# Patient Record
Sex: Female | Born: 1935 | Race: White | Hispanic: No | Smoking: Never smoker
Health system: Southern US, Community
[De-identification: ages and names within clinical notes are randomized; demographics above are authoritative.]

## PROBLEM LIST (undated history)

## (undated) DIAGNOSIS — Z96649 Presence of unspecified artificial hip joint: Secondary | ICD-10-CM

## (undated) DIAGNOSIS — K219 Gastro-esophageal reflux disease without esophagitis: Secondary | ICD-10-CM

## (undated) DIAGNOSIS — K5792 Diverticulitis of intestine, part unspecified, without perforation or abscess without bleeding: Secondary | ICD-10-CM

## (undated) DIAGNOSIS — N6452 Nipple discharge: Secondary | ICD-10-CM

## (undated) DIAGNOSIS — I878 Other specified disorders of veins: Secondary | ICD-10-CM

## (undated) DIAGNOSIS — I4891 Unspecified atrial fibrillation: Secondary | ICD-10-CM

## (undated) DIAGNOSIS — M1990 Unspecified osteoarthritis, unspecified site: Secondary | ICD-10-CM

## (undated) DIAGNOSIS — Z9889 Other specified postprocedural states: Secondary | ICD-10-CM

## (undated) DIAGNOSIS — R55 Syncope and collapse: Secondary | ICD-10-CM

## (undated) DIAGNOSIS — D649 Anemia, unspecified: Secondary | ICD-10-CM

## (undated) DIAGNOSIS — G2581 Restless legs syndrome: Secondary | ICD-10-CM

## (undated) HISTORY — DX: Unspecified osteoarthritis, unspecified site: M19.90

## (undated) HISTORY — PX: APPENDECTOMY: SHX54

## (undated) HISTORY — DX: Other specified postprocedural states: Z98.890

## (undated) HISTORY — DX: Presence of unspecified artificial hip joint: Z96.649

## (undated) HISTORY — DX: Nipple discharge: N64.52

## (undated) HISTORY — DX: Anemia, unspecified: D64.9

## (undated) HISTORY — PX: ABDOMINAL HYSTERECTOMY: SHX81

## (undated) HISTORY — PX: LAPAROSCOPIC GASTROTOMY W/ REPAIR OF ULCER: SUR772

## (undated) HISTORY — PX: CHOLECYSTECTOMY: SHX55

## (undated) HISTORY — DX: Gastro-esophageal reflux disease without esophagitis: K21.9

## (undated) HISTORY — DX: Unspecified atrial fibrillation: I48.91

## (undated) HISTORY — DX: Diverticulitis of intestine, part unspecified, without perforation or abscess without bleeding: K57.92

## (undated) HISTORY — PX: TONSILLECTOMY AND ADENOIDECTOMY: SHX28

---

## 1997-06-02 ENCOUNTER — Inpatient Hospital Stay (HOSPITAL_COMMUNITY): Admission: AD | Admit: 1997-06-02 | Discharge: 1997-06-03 | Payer: Self-pay | Admitting: Cardiology

## 1998-06-08 ENCOUNTER — Other Ambulatory Visit: Admission: RE | Admit: 1998-06-08 | Discharge: 1998-06-08 | Payer: Self-pay | Admitting: Obstetrics and Gynecology

## 1999-08-09 ENCOUNTER — Other Ambulatory Visit: Admission: RE | Admit: 1999-08-09 | Discharge: 1999-08-09 | Payer: Self-pay | Admitting: Obstetrics and Gynecology

## 2000-11-28 ENCOUNTER — Other Ambulatory Visit: Admission: RE | Admit: 2000-11-28 | Discharge: 2000-11-28 | Payer: Self-pay | Admitting: Obstetrics and Gynecology

## 2001-05-25 ENCOUNTER — Encounter: Payer: Self-pay | Admitting: Emergency Medicine

## 2001-05-25 ENCOUNTER — Emergency Department (HOSPITAL_COMMUNITY): Admission: EM | Admit: 2001-05-25 | Discharge: 2001-05-25 | Payer: Self-pay | Admitting: Emergency Medicine

## 2001-06-18 ENCOUNTER — Encounter: Payer: Self-pay | Admitting: Emergency Medicine

## 2001-06-18 ENCOUNTER — Emergency Department (HOSPITAL_COMMUNITY): Admission: EM | Admit: 2001-06-18 | Discharge: 2001-06-18 | Payer: Self-pay | Admitting: Emergency Medicine

## 2001-06-25 ENCOUNTER — Encounter: Payer: Self-pay | Admitting: Internal Medicine

## 2001-06-25 ENCOUNTER — Encounter: Admission: RE | Admit: 2001-06-25 | Discharge: 2001-06-25 | Payer: Self-pay | Admitting: Internal Medicine

## 2001-07-19 ENCOUNTER — Encounter: Payer: Self-pay | Admitting: Emergency Medicine

## 2001-07-19 ENCOUNTER — Inpatient Hospital Stay (HOSPITAL_COMMUNITY): Admission: EM | Admit: 2001-07-19 | Discharge: 2001-07-21 | Payer: Self-pay | Admitting: *Deleted

## 2001-07-20 ENCOUNTER — Encounter: Payer: Self-pay | Admitting: Cardiovascular Disease

## 2001-11-05 ENCOUNTER — Encounter: Payer: Self-pay | Admitting: Emergency Medicine

## 2001-11-05 ENCOUNTER — Inpatient Hospital Stay (HOSPITAL_COMMUNITY): Admission: EM | Admit: 2001-11-05 | Discharge: 2001-11-08 | Payer: Self-pay | Admitting: Emergency Medicine

## 2001-11-06 ENCOUNTER — Encounter: Payer: Self-pay | Admitting: Internal Medicine

## 2003-11-29 ENCOUNTER — Ambulatory Visit: Payer: Self-pay | Admitting: Cardiology

## 2003-12-14 ENCOUNTER — Ambulatory Visit: Payer: Self-pay | Admitting: Cardiology

## 2003-12-19 ENCOUNTER — Ambulatory Visit: Payer: Self-pay | Admitting: Cardiovascular Disease

## 2004-01-06 ENCOUNTER — Ambulatory Visit: Payer: Self-pay | Admitting: Cardiology

## 2004-02-03 ENCOUNTER — Ambulatory Visit: Payer: Self-pay | Admitting: Cardiovascular Disease

## 2004-02-17 ENCOUNTER — Ambulatory Visit: Payer: Self-pay | Admitting: Cardiology

## 2004-03-09 ENCOUNTER — Ambulatory Visit: Payer: Self-pay | Admitting: Internal Medicine

## 2004-03-15 ENCOUNTER — Ambulatory Visit (HOSPITAL_COMMUNITY): Admission: RE | Admit: 2004-03-15 | Discharge: 2004-03-15 | Payer: Self-pay | Admitting: Anesthesiology

## 2004-04-06 ENCOUNTER — Ambulatory Visit: Payer: Self-pay | Admitting: Cardiology

## 2004-04-20 ENCOUNTER — Ambulatory Visit: Payer: Self-pay | Admitting: Gastroenterology

## 2004-04-23 ENCOUNTER — Ambulatory Visit: Payer: Self-pay | Admitting: Cardiology

## 2004-05-14 ENCOUNTER — Ambulatory Visit: Payer: Self-pay | Admitting: Cardiology

## 2004-06-11 ENCOUNTER — Ambulatory Visit: Payer: Self-pay | Admitting: Cardiology

## 2004-06-13 ENCOUNTER — Ambulatory Visit: Payer: Self-pay | Admitting: Cardiovascular Disease

## 2004-06-26 ENCOUNTER — Ambulatory Visit: Payer: Self-pay

## 2004-07-11 ENCOUNTER — Ambulatory Visit: Payer: Self-pay | Admitting: *Deleted

## 2004-08-08 ENCOUNTER — Ambulatory Visit: Payer: Self-pay | Admitting: Cardiology

## 2004-08-22 ENCOUNTER — Ambulatory Visit: Payer: Self-pay | Admitting: Internal Medicine

## 2004-08-29 ENCOUNTER — Ambulatory Visit: Payer: Self-pay | Admitting: Cardiology

## 2004-09-19 ENCOUNTER — Ambulatory Visit: Payer: Self-pay | Admitting: *Deleted

## 2004-10-10 ENCOUNTER — Ambulatory Visit: Payer: Self-pay | Admitting: Cardiology

## 2004-10-24 ENCOUNTER — Ambulatory Visit: Payer: Self-pay | Admitting: Cardiology

## 2004-11-14 ENCOUNTER — Ambulatory Visit: Payer: Self-pay | Admitting: Cardiology

## 2004-12-12 ENCOUNTER — Ambulatory Visit: Payer: Self-pay | Admitting: Cardiology

## 2005-01-03 ENCOUNTER — Ambulatory Visit: Payer: Self-pay | Admitting: *Deleted

## 2005-01-31 ENCOUNTER — Ambulatory Visit: Payer: Self-pay | Admitting: Cardiology

## 2005-03-01 ENCOUNTER — Ambulatory Visit: Payer: Self-pay | Admitting: Internal Medicine

## 2005-03-29 ENCOUNTER — Ambulatory Visit: Payer: Self-pay | Admitting: Internal Medicine

## 2005-05-03 ENCOUNTER — Ambulatory Visit: Payer: Self-pay | Admitting: Cardiology

## 2005-05-30 ENCOUNTER — Ambulatory Visit: Payer: Self-pay | Admitting: Cardiovascular Disease

## 2005-05-30 ENCOUNTER — Ambulatory Visit: Payer: Self-pay | Admitting: Cardiology

## 2005-06-19 ENCOUNTER — Ambulatory Visit: Payer: Self-pay | Admitting: Internal Medicine

## 2005-06-20 ENCOUNTER — Ambulatory Visit: Payer: Self-pay | Admitting: Cardiology

## 2005-07-11 ENCOUNTER — Ambulatory Visit: Payer: Self-pay | Admitting: Cardiology

## 2005-07-12 ENCOUNTER — Ambulatory Visit: Payer: Self-pay | Admitting: Gastroenterology

## 2005-07-26 ENCOUNTER — Ambulatory Visit: Payer: Self-pay | Admitting: Internal Medicine

## 2005-08-05 ENCOUNTER — Ambulatory Visit: Payer: Self-pay | Admitting: Cardiology

## 2005-08-19 ENCOUNTER — Ambulatory Visit: Payer: Self-pay | Admitting: Cardiovascular Disease

## 2005-09-02 ENCOUNTER — Encounter: Admission: RE | Admit: 2005-09-02 | Discharge: 2005-09-02 | Payer: Self-pay | Admitting: General Surgery

## 2005-09-03 ENCOUNTER — Ambulatory Visit (HOSPITAL_BASED_OUTPATIENT_CLINIC_OR_DEPARTMENT_OTHER): Admission: RE | Admit: 2005-09-03 | Discharge: 2005-09-03 | Payer: Self-pay | Admitting: General Surgery

## 2005-09-03 ENCOUNTER — Encounter (INDEPENDENT_AMBULATORY_CARE_PROVIDER_SITE_OTHER): Payer: Self-pay | Admitting: *Deleted

## 2005-09-11 ENCOUNTER — Ambulatory Visit: Payer: Self-pay | Admitting: Cardiology

## 2005-09-20 ENCOUNTER — Ambulatory Visit: Payer: Self-pay | Admitting: Cardiology

## 2005-10-08 ENCOUNTER — Ambulatory Visit: Payer: Self-pay | Admitting: Cardiology

## 2005-11-05 ENCOUNTER — Ambulatory Visit: Payer: Self-pay | Admitting: Cardiology

## 2005-11-29 ENCOUNTER — Ambulatory Visit: Payer: Self-pay | Admitting: Cardiovascular Disease

## 2005-12-04 ENCOUNTER — Ambulatory Visit: Payer: Self-pay | Admitting: Cardiology

## 2005-12-20 ENCOUNTER — Ambulatory Visit: Payer: Self-pay | Admitting: Cardiology

## 2006-01-17 ENCOUNTER — Ambulatory Visit: Payer: Self-pay | Admitting: Cardiology

## 2006-02-14 ENCOUNTER — Ambulatory Visit: Payer: Self-pay | Admitting: Internal Medicine

## 2006-02-14 ENCOUNTER — Ambulatory Visit: Payer: Self-pay | Admitting: Gastroenterology

## 2006-02-17 ENCOUNTER — Ambulatory Visit: Payer: Self-pay | Admitting: Internal Medicine

## 2006-02-20 ENCOUNTER — Ambulatory Visit: Payer: Self-pay | Admitting: Gastroenterology

## 2006-02-21 DIAGNOSIS — Z9889 Other specified postprocedural states: Secondary | ICD-10-CM

## 2006-02-21 HISTORY — DX: Other specified postprocedural states: Z98.890

## 2006-02-28 ENCOUNTER — Ambulatory Visit: Payer: Self-pay | Admitting: *Deleted

## 2006-03-07 ENCOUNTER — Ambulatory Visit: Payer: Self-pay | Admitting: Gastroenterology

## 2006-03-07 LAB — CONVERTED CEMR LAB
OCCULT 1: POSITIVE — AB
OCCULT 4: NEGATIVE
OCCULT 5: NEGATIVE

## 2006-03-11 ENCOUNTER — Ambulatory Visit: Payer: Self-pay | Admitting: Gastroenterology

## 2006-03-14 ENCOUNTER — Ambulatory Visit: Payer: Self-pay | Admitting: Internal Medicine

## 2006-03-14 ENCOUNTER — Inpatient Hospital Stay (HOSPITAL_COMMUNITY): Admission: EM | Admit: 2006-03-14 | Discharge: 2006-03-18 | Payer: Self-pay | Admitting: *Deleted

## 2006-03-14 LAB — CONVERTED CEMR LAB
INR: 7 (ref 0.9–2.0)
Prothrombin Time: 34.7 s (ref 10.0–14.0)

## 2006-03-17 ENCOUNTER — Ambulatory Visit: Payer: Self-pay | Admitting: Gastroenterology

## 2006-03-19 ENCOUNTER — Ambulatory Visit: Payer: Self-pay | Admitting: Internal Medicine

## 2006-03-24 ENCOUNTER — Ambulatory Visit: Payer: Self-pay | Admitting: Cardiovascular Disease

## 2006-04-03 ENCOUNTER — Emergency Department (HOSPITAL_COMMUNITY): Admission: EM | Admit: 2006-04-03 | Discharge: 2006-04-03 | Payer: Self-pay | Admitting: Emergency Medicine

## 2006-04-08 ENCOUNTER — Ambulatory Visit: Payer: Self-pay | Admitting: Gastroenterology

## 2006-04-08 ENCOUNTER — Ambulatory Visit: Payer: Self-pay | Admitting: Cardiology

## 2006-04-15 ENCOUNTER — Ambulatory Visit: Payer: Self-pay | Admitting: Internal Medicine

## 2006-04-21 ENCOUNTER — Ambulatory Visit: Payer: Self-pay | Admitting: Cardiovascular Disease

## 2006-05-12 ENCOUNTER — Ambulatory Visit: Payer: Self-pay | Admitting: Cardiology

## 2006-05-16 ENCOUNTER — Ambulatory Visit: Payer: Self-pay | Admitting: Internal Medicine

## 2006-05-16 LAB — CONVERTED CEMR LAB: Potassium: 4.5 meq/L (ref 3.5–5.1)

## 2006-05-20 ENCOUNTER — Ambulatory Visit: Payer: Self-pay | Admitting: Cardiology

## 2006-05-30 ENCOUNTER — Ambulatory Visit: Payer: Self-pay | Admitting: Cardiology

## 2006-05-30 ENCOUNTER — Ambulatory Visit: Payer: Self-pay | Admitting: Cardiovascular Disease

## 2006-06-06 ENCOUNTER — Ambulatory Visit: Payer: Self-pay | Admitting: Cardiology

## 2006-06-19 ENCOUNTER — Ambulatory Visit: Payer: Self-pay | Admitting: *Deleted

## 2006-06-26 ENCOUNTER — Encounter: Payer: Self-pay | Admitting: Internal Medicine

## 2006-06-27 ENCOUNTER — Encounter: Payer: Self-pay | Admitting: Internal Medicine

## 2006-07-01 ENCOUNTER — Ambulatory Visit: Payer: Self-pay | Admitting: Cardiology

## 2006-07-15 ENCOUNTER — Ambulatory Visit: Payer: Self-pay | Admitting: Cardiology

## 2006-08-05 ENCOUNTER — Ambulatory Visit: Payer: Self-pay | Admitting: Cardiovascular Disease

## 2006-08-12 ENCOUNTER — Telehealth (INDEPENDENT_AMBULATORY_CARE_PROVIDER_SITE_OTHER): Payer: Self-pay | Admitting: *Deleted

## 2006-08-22 ENCOUNTER — Ambulatory Visit: Payer: Self-pay | Admitting: Internal Medicine

## 2006-08-28 LAB — CONVERTED CEMR LAB
Basophils Relative: 0.9 % (ref 0.0–1.0)
HCT: 27.9 % — ABNORMAL LOW (ref 36.0–46.0)
Hemoglobin: 9.4 g/dL — ABNORMAL LOW (ref 12.0–15.0)
Lymphocytes Relative: 40.6 % (ref 12.0–46.0)
Monocytes Absolute: 0.5 10*3/uL (ref 0.2–0.7)
Monocytes Relative: 9.9 % (ref 3.0–11.0)
Neutro Abs: 2.3 10*3/uL (ref 1.4–7.7)
Neutrophils Relative %: 45.1 % (ref 43.0–77.0)

## 2006-09-02 ENCOUNTER — Ambulatory Visit: Payer: Self-pay | Admitting: Cardiology

## 2006-09-02 ENCOUNTER — Telehealth: Payer: Self-pay | Admitting: Internal Medicine

## 2006-09-09 ENCOUNTER — Ambulatory Visit: Payer: Self-pay | Admitting: Internal Medicine

## 2006-09-10 ENCOUNTER — Encounter (INDEPENDENT_AMBULATORY_CARE_PROVIDER_SITE_OTHER): Payer: Self-pay | Admitting: *Deleted

## 2006-09-26 ENCOUNTER — Encounter: Payer: Self-pay | Admitting: Internal Medicine

## 2006-09-29 ENCOUNTER — Ambulatory Visit: Payer: Self-pay | Admitting: Cardiology

## 2006-10-27 ENCOUNTER — Ambulatory Visit: Payer: Self-pay | Admitting: Internal Medicine

## 2006-11-07 ENCOUNTER — Ambulatory Visit: Payer: Self-pay | Admitting: Cardiovascular Disease

## 2006-11-14 ENCOUNTER — Ambulatory Visit: Payer: Self-pay | Admitting: Cardiovascular Disease

## 2006-12-05 ENCOUNTER — Ambulatory Visit: Payer: Self-pay | Admitting: Cardiology

## 2006-12-16 ENCOUNTER — Ambulatory Visit (HOSPITAL_COMMUNITY): Admission: RE | Admit: 2006-12-16 | Discharge: 2006-12-16 | Payer: Self-pay | Admitting: General Surgery

## 2006-12-25 ENCOUNTER — Ambulatory Visit: Payer: Self-pay | Admitting: Cardiovascular Disease

## 2007-01-05 ENCOUNTER — Ambulatory Visit: Payer: Self-pay | Admitting: Internal Medicine

## 2007-01-21 ENCOUNTER — Ambulatory Visit: Payer: Self-pay | Admitting: Cardiology

## 2007-02-18 ENCOUNTER — Ambulatory Visit: Payer: Self-pay | Admitting: Cardiology

## 2007-03-17 ENCOUNTER — Ambulatory Visit: Payer: Self-pay | Admitting: Cardiology

## 2007-04-08 ENCOUNTER — Encounter (INDEPENDENT_AMBULATORY_CARE_PROVIDER_SITE_OTHER): Payer: Self-pay | Admitting: *Deleted

## 2007-04-08 ENCOUNTER — Telehealth (INDEPENDENT_AMBULATORY_CARE_PROVIDER_SITE_OTHER): Payer: Self-pay | Admitting: *Deleted

## 2007-04-14 ENCOUNTER — Ambulatory Visit: Payer: Self-pay | Admitting: Cardiovascular Disease

## 2007-05-12 ENCOUNTER — Ambulatory Visit: Payer: Self-pay | Admitting: Cardiology

## 2007-06-09 ENCOUNTER — Ambulatory Visit: Payer: Self-pay | Admitting: Cardiology

## 2007-07-06 ENCOUNTER — Ambulatory Visit: Payer: Self-pay | Admitting: Internal Medicine

## 2007-07-13 ENCOUNTER — Encounter: Admission: RE | Admit: 2007-07-13 | Discharge: 2007-07-13 | Payer: Self-pay | Admitting: Anesthesiology

## 2007-07-27 ENCOUNTER — Ambulatory Visit: Payer: Self-pay | Admitting: Internal Medicine

## 2007-08-18 ENCOUNTER — Ambulatory Visit: Payer: Self-pay | Admitting: Internal Medicine

## 2007-08-27 ENCOUNTER — Telehealth (INDEPENDENT_AMBULATORY_CARE_PROVIDER_SITE_OTHER): Payer: Self-pay | Admitting: *Deleted

## 2007-09-15 ENCOUNTER — Ambulatory Visit: Payer: Self-pay | Admitting: Cardiology

## 2007-10-13 ENCOUNTER — Ambulatory Visit: Payer: Self-pay | Admitting: Cardiology

## 2007-11-02 ENCOUNTER — Ambulatory Visit: Payer: Self-pay | Admitting: Cardiology

## 2007-11-20 ENCOUNTER — Encounter: Payer: Self-pay | Admitting: Internal Medicine

## 2007-11-25 ENCOUNTER — Ambulatory Visit: Payer: Self-pay | Admitting: Cardiovascular Disease

## 2007-12-07 ENCOUNTER — Ambulatory Visit: Payer: Self-pay | Admitting: Cardiology

## 2007-12-07 ENCOUNTER — Ambulatory Visit: Payer: Self-pay | Admitting: Cardiovascular Disease

## 2007-12-23 ENCOUNTER — Ambulatory Visit: Payer: Self-pay | Admitting: Internal Medicine

## 2008-01-13 ENCOUNTER — Ambulatory Visit: Payer: Self-pay | Admitting: Cardiovascular Disease

## 2008-01-22 HISTORY — PX: TOTAL HIP ARTHROPLASTY: SHX124

## 2008-02-04 ENCOUNTER — Ambulatory Visit: Payer: Self-pay | Admitting: Cardiovascular Disease

## 2008-02-18 ENCOUNTER — Ambulatory Visit: Payer: Self-pay | Admitting: Internal Medicine

## 2008-03-07 ENCOUNTER — Ambulatory Visit: Payer: Self-pay | Admitting: Internal Medicine

## 2008-03-09 DIAGNOSIS — I482 Chronic atrial fibrillation, unspecified: Secondary | ICD-10-CM

## 2008-03-09 DIAGNOSIS — T827XXA Infection and inflammatory reaction due to other cardiac and vascular devices, implants and grafts, initial encounter: Secondary | ICD-10-CM

## 2008-03-10 ENCOUNTER — Encounter: Payer: Self-pay | Admitting: Cardiovascular Disease

## 2008-03-10 ENCOUNTER — Ambulatory Visit: Payer: Self-pay | Admitting: Cardiovascular Disease

## 2008-03-18 ENCOUNTER — Inpatient Hospital Stay (HOSPITAL_COMMUNITY): Admission: RE | Admit: 2008-03-18 | Discharge: 2008-03-22 | Payer: Self-pay | Admitting: Orthopedic Surgery

## 2008-05-09 ENCOUNTER — Ambulatory Visit: Payer: Self-pay | Admitting: Internal Medicine

## 2008-05-09 DIAGNOSIS — I872 Venous insufficiency (chronic) (peripheral): Secondary | ICD-10-CM | POA: Insufficient documentation

## 2008-05-11 ENCOUNTER — Encounter (INDEPENDENT_AMBULATORY_CARE_PROVIDER_SITE_OTHER): Payer: Self-pay | Admitting: *Deleted

## 2008-05-11 LAB — CONVERTED CEMR LAB: Creatinine, Ser: 1.1 mg/dL (ref 0.4–1.2)

## 2008-05-16 ENCOUNTER — Ambulatory Visit: Payer: Self-pay | Admitting: Cardiology

## 2008-05-30 ENCOUNTER — Ambulatory Visit: Payer: Self-pay | Admitting: Cardiovascular Disease

## 2008-06-21 ENCOUNTER — Encounter: Payer: Self-pay | Admitting: *Deleted

## 2008-06-27 ENCOUNTER — Ambulatory Visit: Payer: Self-pay | Admitting: Cardiovascular Disease

## 2008-06-27 LAB — CONVERTED CEMR LAB: POC INR: 2.4

## 2008-07-26 ENCOUNTER — Encounter (INDEPENDENT_AMBULATORY_CARE_PROVIDER_SITE_OTHER): Payer: Self-pay | Admitting: Cardiology

## 2008-07-26 ENCOUNTER — Ambulatory Visit: Payer: Self-pay | Admitting: Cardiology

## 2008-07-26 LAB — CONVERTED CEMR LAB
POC INR: 2.1
Prothrombin Time: 17.9 s

## 2008-07-27 ENCOUNTER — Encounter: Payer: Self-pay | Admitting: *Deleted

## 2008-08-01 ENCOUNTER — Encounter: Payer: Self-pay | Admitting: Internal Medicine

## 2008-08-18 ENCOUNTER — Ambulatory Visit: Payer: Self-pay | Admitting: Internal Medicine

## 2008-08-18 LAB — CONVERTED CEMR LAB: Vit D, 25-Hydroxy: 27 ng/mL — ABNORMAL LOW (ref 30–89)

## 2008-08-23 ENCOUNTER — Ambulatory Visit: Payer: Self-pay | Admitting: Cardiology

## 2008-08-23 LAB — CONVERTED CEMR LAB: Prothrombin Time: 18.1 s

## 2008-08-25 ENCOUNTER — Ambulatory Visit: Payer: Self-pay | Admitting: Internal Medicine

## 2008-08-25 DIAGNOSIS — M810 Age-related osteoporosis without current pathological fracture: Secondary | ICD-10-CM | POA: Insufficient documentation

## 2008-08-25 DIAGNOSIS — E559 Vitamin D deficiency, unspecified: Secondary | ICD-10-CM | POA: Insufficient documentation

## 2008-09-15 ENCOUNTER — Telehealth (INDEPENDENT_AMBULATORY_CARE_PROVIDER_SITE_OTHER): Payer: Self-pay | Admitting: *Deleted

## 2008-09-27 ENCOUNTER — Ambulatory Visit: Payer: Self-pay | Admitting: Cardiology

## 2008-09-27 ENCOUNTER — Ambulatory Visit: Payer: Self-pay | Admitting: Cardiovascular Disease

## 2008-10-25 ENCOUNTER — Ambulatory Visit: Payer: Self-pay | Admitting: Cardiology

## 2008-10-25 LAB — CONVERTED CEMR LAB: POC INR: 2.3

## 2008-11-14 ENCOUNTER — Ambulatory Visit: Payer: Self-pay | Admitting: Internal Medicine

## 2008-11-22 ENCOUNTER — Ambulatory Visit: Payer: Self-pay | Admitting: Internal Medicine

## 2008-12-20 ENCOUNTER — Ambulatory Visit: Payer: Self-pay | Admitting: Cardiology

## 2009-01-17 ENCOUNTER — Ambulatory Visit: Payer: Self-pay | Admitting: Cardiology

## 2009-01-17 ENCOUNTER — Encounter (INDEPENDENT_AMBULATORY_CARE_PROVIDER_SITE_OTHER): Payer: Self-pay | Admitting: *Deleted

## 2009-01-17 LAB — CONVERTED CEMR LAB: POC INR: 2.3

## 2009-02-13 ENCOUNTER — Ambulatory Visit: Payer: Self-pay | Admitting: Cardiology

## 2009-02-13 LAB — CONVERTED CEMR LAB: POC INR: 1.7

## 2009-02-16 ENCOUNTER — Telehealth (INDEPENDENT_AMBULATORY_CARE_PROVIDER_SITE_OTHER): Payer: Self-pay | Admitting: *Deleted

## 2009-02-20 ENCOUNTER — Encounter: Payer: Self-pay | Admitting: Internal Medicine

## 2009-02-27 ENCOUNTER — Ambulatory Visit: Payer: Self-pay | Admitting: Internal Medicine

## 2009-02-27 DIAGNOSIS — K5730 Diverticulosis of large intestine without perforation or abscess without bleeding: Secondary | ICD-10-CM | POA: Insufficient documentation

## 2009-02-27 DIAGNOSIS — I10 Essential (primary) hypertension: Secondary | ICD-10-CM | POA: Insufficient documentation

## 2009-02-27 DIAGNOSIS — D649 Anemia, unspecified: Secondary | ICD-10-CM

## 2009-02-28 LAB — CONVERTED CEMR LAB
BUN: 20 mg/dL (ref 6–23)
Eosinophils Relative: 2.1 % (ref 0.0–5.0)
HCT: 33.2 % — ABNORMAL LOW (ref 36.0–46.0)
Hemoglobin: 10.9 g/dL — ABNORMAL LOW (ref 12.0–15.0)
Lymphocytes Relative: 22.9 % (ref 12.0–46.0)
Lymphs Abs: 1.6 10*3/uL (ref 0.7–4.0)
Monocytes Relative: 8.4 % (ref 3.0–12.0)
Neutro Abs: 4.8 10*3/uL (ref 1.4–7.7)
RBC: 3.25 M/uL — ABNORMAL LOW (ref 3.87–5.11)
Vit D, 25-Hydroxy: 47 ng/mL (ref 30–89)
WBC: 7.1 10*3/uL (ref 4.5–10.5)

## 2009-03-06 ENCOUNTER — Encounter (INDEPENDENT_AMBULATORY_CARE_PROVIDER_SITE_OTHER): Payer: Self-pay | Admitting: Cardiology

## 2009-03-06 ENCOUNTER — Ambulatory Visit: Payer: Self-pay | Admitting: Cardiology

## 2009-03-06 LAB — CONVERTED CEMR LAB: POC INR: 2.3

## 2009-03-31 ENCOUNTER — Ambulatory Visit: Payer: Self-pay | Admitting: Cardiovascular Disease

## 2009-03-31 ENCOUNTER — Ambulatory Visit: Payer: Self-pay | Admitting: Cardiology

## 2009-05-01 ENCOUNTER — Ambulatory Visit: Payer: Self-pay | Admitting: Internal Medicine

## 2009-05-01 ENCOUNTER — Ambulatory Visit: Payer: Self-pay | Admitting: Cardiovascular Disease

## 2009-05-01 LAB — CONVERTED CEMR LAB: POC INR: 2.6

## 2009-05-08 ENCOUNTER — Encounter: Admission: RE | Admit: 2009-05-08 | Discharge: 2009-05-08 | Payer: Self-pay | Admitting: Anesthesiology

## 2009-05-08 LAB — CONVERTED CEMR LAB
Basophils Absolute: 0 10*3/uL (ref 0.0–0.1)
Basophils Relative: 0.2 % (ref 0.0–3.0)
Eosinophils Absolute: 0.2 10*3/uL (ref 0.0–0.7)
Eosinophils Relative: 2.8 % (ref 0.0–5.0)
Folate: >20 ng/mL
HCT: 30 % — ABNORMAL LOW (ref 36.0–46.0)
Hemoglobin: 10.3 g/dL — ABNORMAL LOW (ref 12.0–15.0)
Iron: 98 ug/dL (ref 42–145)
Lymphocytes Relative: 28.7 % (ref 12.0–46.0)
Lymphs Abs: 1.6 10*3/uL (ref 0.7–4.0)
MCHC: 34.3 g/dL (ref 30.0–36.0)
MCV: 97.5 fL (ref 78.0–100.0)
Monocytes Absolute: 0.5 10*3/uL (ref 0.1–1.0)
Monocytes Relative: 9.7 % (ref 3.0–12.0)
Neutro Abs: 3.2 10*3/uL (ref 1.4–7.7)
Neutrophils Relative %: 58.6 % (ref 43.0–77.0)
Platelets: 213 10*3/uL (ref 150.0–400.0)
RBC: 3.07 M/uL — ABNORMAL LOW (ref 3.87–5.11)
RDW: 13.8 % (ref 11.5–14.6)
Vitamin B-12: 405 pg/mL (ref 211–911)
WBC: 5.4 10*3/uL (ref 4.5–10.5)

## 2009-05-17 ENCOUNTER — Telehealth (INDEPENDENT_AMBULATORY_CARE_PROVIDER_SITE_OTHER): Payer: Self-pay | Admitting: *Deleted

## 2009-05-29 ENCOUNTER — Ambulatory Visit: Payer: Self-pay | Admitting: Cardiology

## 2009-05-30 ENCOUNTER — Ambulatory Visit: Payer: Self-pay | Admitting: Internal Medicine

## 2009-05-30 LAB — CONVERTED CEMR LAB: OCCULT 1: NEGATIVE

## 2009-05-31 ENCOUNTER — Encounter (INDEPENDENT_AMBULATORY_CARE_PROVIDER_SITE_OTHER): Payer: Self-pay | Admitting: *Deleted

## 2009-06-26 ENCOUNTER — Ambulatory Visit: Payer: Self-pay | Admitting: Cardiology

## 2009-06-26 LAB — CONVERTED CEMR LAB: POC INR: 2.1

## 2009-07-19 ENCOUNTER — Observation Stay (HOSPITAL_COMMUNITY): Admission: EM | Admit: 2009-07-19 | Discharge: 2009-07-21 | Payer: Self-pay | Admitting: Emergency Medicine

## 2009-07-31 ENCOUNTER — Ambulatory Visit: Payer: Self-pay | Admitting: Internal Medicine

## 2009-07-31 LAB — CONVERTED CEMR LAB: POC INR: 2

## 2009-08-11 ENCOUNTER — Ambulatory Visit: Payer: Self-pay | Admitting: Internal Medicine

## 2009-08-11 DIAGNOSIS — K219 Gastro-esophageal reflux disease without esophagitis: Secondary | ICD-10-CM

## 2009-08-28 ENCOUNTER — Ambulatory Visit: Payer: Self-pay | Admitting: Cardiovascular Disease

## 2009-09-12 ENCOUNTER — Telehealth (INDEPENDENT_AMBULATORY_CARE_PROVIDER_SITE_OTHER): Payer: Self-pay | Admitting: *Deleted

## 2009-09-27 ENCOUNTER — Encounter: Payer: Self-pay | Admitting: Internal Medicine

## 2009-10-03 ENCOUNTER — Ambulatory Visit: Payer: Self-pay | Admitting: Cardiovascular Disease

## 2009-10-03 LAB — CONVERTED CEMR LAB: POC INR: 2.2

## 2009-10-30 ENCOUNTER — Ambulatory Visit: Payer: Self-pay | Admitting: Cardiology

## 2009-11-27 ENCOUNTER — Ambulatory Visit: Payer: Self-pay | Admitting: Internal Medicine

## 2009-12-18 ENCOUNTER — Encounter: Payer: Self-pay | Admitting: Internal Medicine

## 2009-12-25 ENCOUNTER — Ambulatory Visit: Payer: Self-pay | Admitting: Internal Medicine

## 2009-12-27 ENCOUNTER — Encounter: Payer: Self-pay | Admitting: Internal Medicine

## 2010-01-29 ENCOUNTER — Ambulatory Visit: Admission: RE | Admit: 2010-01-29 | Discharge: 2010-01-29 | Payer: Self-pay | Source: Home / Self Care

## 2010-01-29 LAB — CONVERTED CEMR LAB: POC INR: 2.1

## 2010-02-20 NOTE — Medication Information (Signed)
Summary: rov/tm   Anticoagulant Therapy  Managed by: Weston Brass, PharmD Referring MD: Charlton Haws MD PCP: Marga Melnick MD Supervising MD: Tenny Craw MD, Gunnar Fusi Indication 1: Atrial Fibrillation (ICD-427.31) Lab Used: [facility] Site: [facility] INR POC 2.9 INR RANGE 2 - 3  Dietary changes: no    Health status changes: no    Bleeding/hemorrhagic complications: no    Recent/future hospitalizations: no    Any changes in medication regimen? no    Recent/future dental: no  Any missed doses?: no       Is patient compliant with meds? yes       Allergies: 1)  ! Melatonin (Melatonin) 2)  ! Ibuprofen 3)  ! Talwin 4)  ! Haldol 5)  ! Nortriptyline Hcl (Nortriptyline Hcl) 6)  ! Morphine 7)  ! * Antihistamines 8)  ! * Tizandine 9)  ! Dilaudid (Hydromorphone Hcl)  Anticoagulation Management History:      The patient is taking warfarin and comes in today for a routine follow up visit.  Positive risk factors for bleeding include an age of 75 years or older and presence of serious comorbidities.  The bleeding index is 'intermediate risk'.  Positive CHADS2 values include History of HTN and Age > 64 years old.  The start date was 11/03/2002.  Her last INR was 7.0 RATIO.  Anticoagulation responsible provider: Tenny Craw MD, Gunnar Fusi.  INR POC: 2.9.  Cuvette Lot#: [id number].  Exp: 12/2010.    Anticoagulation Management Assessment/Plan:      The patient's current anticoagulation dose is Warfarin sodium 2.5 mg tabs: Use as directed by Anticoagualtion Clinic.  The target INR is 2 - 3.  The next INR is due 01/29/2010.  Anticoagulation instructions were given to patient.  Results were reviewed/authorized by Weston Brass, PharmD.  She was notified by Weston Brass PharmD.         Prior Anticoagulation Instructions: INR 2.5 Continue 1/2 pill everyday except 1 pill on Mondays and Fridays. Recheck in 4 weeks.   Current Anticoagulation Instructions: INR 2.9  Continue same dose of 1/2 tablet every day except  1 tablet on Monday and Friday.  Recheck INR in 4 weeks.

## 2010-02-20 NOTE — Medication Information (Signed)
Summary: rov/sp  Anticoagulant Therapy  Managed by: Lyna Poser, PharmD Referring MD: Charlton Haws MD Supervising MD: Clifton James MD, Cristal Deer Indication 1: Atrial Fibrillation (ICD-427.31) Lab Used: [facility] Site: [facility] INR POC 2.2 INR RANGE 2 - 3  Dietary changes: no    Health status changes: no    Bleeding/hemorrhagic complications: no    Recent/future hospitalizations: no    Any changes in medication regimen? no    Recent/future dental: no  Any missed doses?: no       Is patient compliant with meds? yes       Allergies: 1)  ! Melatonin (Melatonin) 2)  ! Ibuprofen 3)  ! Talwin 4)  ! Haldol 5)  ! Nortriptyline Hcl (Nortriptyline Hcl) 6)  ! Morphine 7)  ! * Antihistamines 8)  ! * Tizandine 9)  ! Dilaudid (Hydromorphone Hcl)  Anticoagulation Management History:      Positive risk factors for bleeding include an age of 75 years or older and presence of serious comorbidities.  The bleeding index is 'intermediate risk'.  Positive CHADS2 values include History of HTN and Age > 75 years old.  The start date was 11/03/2002.  Her last INR was 7.0 RATIO.  Anticoagulation responsible provider: Clifton James MD, Cristal Deer.  INR POC: 2.2.  Exp: 10/2010.    Anticoagulation Management Assessment/Plan:      The patient's current anticoagulation dose is Warfarin sodium 2.5 mg tabs: Use as directed by Anticoagualtion Clinic.  The target INR is 2 - 3.  The next INR is due 10/30/2009.  Anticoagulation instructions were given to patient.  Results were reviewed/authorized by Lyna Poser, PharmD.         Prior Anticoagulation Instructions: INR 2.0  Continue 1/2 tablet daily except  1 tablet Mon and Fri.  Return to clinic in 5 weeks.  Current Anticoagulation Instructions: INR 2.2 No changes to doses. Keep taking 1/2 tablet on everyday except on monday and friday take a whole tablet. See you in 4 weeks.

## 2010-02-20 NOTE — Letter (Signed)
Summary: Results Follow up Letter  [facility] [place]  [address] [place], [place] [address]   Phone: [phone]  Fax: [phone]    05/31/2009 MRN: [id number]  [facility] [address] [place], [place]  [address]  Dear Ms. Loch,  The following are the results of your recent test(s):  Test         Result    Pap Smear:        Normal _____  Not Normal _____ Comments: ______________________________________________________ Cholesterol: LDL(Bad cholesterol):         Your goal is less than:         HDL (Good cholesterol):       Your goal is more than: Comments:  ______________________________________________________ Mammogram:        Normal _____  Not Normal _____ Comments:  ___________________________________________________________________ Hemoccult:        Normal _X___  Not normal _______ Comments:    _____________________________________________________________________ Other Tests:    We routinely do not discuss normal results over the telephone.  If you desire a copy of the results, or you have any questions about this information we can discuss them at your next office visit.   Sincerely,

## 2010-02-20 NOTE — Progress Notes (Signed)
Summary: Refill Request  Phone Note Refill Request   Refills Requested: Medication #1:  MIRALAX   POWD prn  Medication #2:  PROTONIX 40 MG  TBEC 1 by mouth once daily [facility]-Miralax [facility]-Protonix(generic)  Initial call taken by: Shonna Chock,  February 16, 2009 10:42 AM    Prescriptions: PROTONIX 40 MG  TBEC (PANTOPRAZOLE SODIUM) 1 by mouth once daily  #90 x 1   Entered by:   Shonna Chock   Authorized by:   Marga Melnick MD   Signed by:   Shonna Chock on 02/16/2009   Method used:   Faxed to ...       [facility] ([facility]order)       [address] [place], [place]  [address]       Ph: [phone]       Fax: [phone]   RxID:   [id number] MIRALAX   POWD (POLYETHYLENE GLYCOL 3350) prn  #527 x 6   Entered by:   Shonna Chock   Authorized by:   Marga Melnick MD   Signed by:   Shonna Chock on 02/16/2009   Method used:   Electronically to        [facility] # [id number]* (retail)       [address]       [place], [place]  [address]       Ph: [phone]       Fax: [phone]   RxID:   [id number]

## 2010-02-20 NOTE — Medication Information (Signed)
Summary: rov/tm  Anticoagulant Therapy  Managed by: Shelby Dubin, PharmD, BCPS, CPP Referring MD: Charlton Haws MD Supervising MD: Shirlee Latch MD, Freida Busman Indication 1: Atrial Fibrillation (ICD-427.31) Lab Used: [facility] Site: [facility] INR POC 2.3 INR RANGE 2 - 3  Dietary changes: no    Health status changes: no    Bleeding/hemorrhagic complications: no    Recent/future hospitalizations: no    Any changes in medication regimen? yes       Details: added MVI  Recent/future dental: no  Any missed doses?: no       Is patient compliant with meds? yes       Allergies: 1)  ! Melatonin (Melatonin) 2)  ! Ibuprofen 3)  ! Talwin 4)  ! Haldol 5)  ! Nortriptyline Hcl (Nortriptyline Hcl) 6)  ! Morphine 7)  ! * Antihistamines 8)  ! * Tizandine 9)  ! Dilaudid (Hydromorphone Hcl)  Anticoagulation Management History:      The patient is taking warfarin and comes in today for a routine follow up visit.  Positive risk factors for bleeding include an age of 10 years or older and presence of serious comorbidities.  The bleeding index is 'intermediate risk'.  Positive CHADS2 values include History of HTN and Age > 42 years old.  The start date was 11/03/2002.  Her last INR was 7.0 RATIO.  Anticoagulation responsible provider: Shirlee Latch MD, [place].  INR POC: 2.3.  Cuvette Lot#: [id number].  Exp: 04/2010.    Anticoagulation Management Assessment/Plan:      The patient's current anticoagulation dose is Warfarin sodium 2.5 mg tabs: Use as directed by Anticoagualtion Clinic.  The target INR is 2 - 3.  The next INR is due 03/31/2009.  Anticoagulation instructions were given to patient.  Results were reviewed/authorized by Shelby Dubin, PharmD, BCPS, CPP.  She was notified by Shelby Dubin PharmD, BCPS, CPP.         Prior Anticoagulation Instructions: INR 1.7  Take 1.5 tablets today then resume dose of 0.5 tablet daily except 1 tablet on Mondays and Fridays. Recheck in 3 weeks.  Current Anticoagulation  Instructions: INR 2.3  Continue 0.5 tab daily except 1 tab on Mondays and Fridays.  Recheck in 4 weeks.

## 2010-02-20 NOTE — Letter (Signed)
Summary: Handout Printed  Printed Handout:  - Coumadin Instructions-w/out Meds 

## 2010-02-20 NOTE — Medication Information (Signed)
Summary: rov/sel  Anticoagulant Therapy  Managed by: Bethena Midget, RN, BSN Referring MD: Charlton Haws MD PCP: Marga Melnick MD Supervising MD: Johney Frame MD, Fayrene Fearing Indication 1: Atrial Fibrillation (ICD-427.31) Lab Used: [facility] Site: [facility] INR POC 2.5 INR RANGE 2 - 3  Dietary changes: no    Health status changes: no    Bleeding/hemorrhagic complications: no    Recent/future hospitalizations: no    Any changes in medication regimen? no    Recent/future dental: no  Any missed doses?: no       Is patient compliant with meds? yes       Allergies: 1)  ! Melatonin (Melatonin) 2)  ! Ibuprofen 3)  ! Talwin 4)  ! Haldol 5)  ! Nortriptyline Hcl (Nortriptyline Hcl) 6)  ! Morphine 7)  ! * Antihistamines 8)  ! * Tizandine 9)  ! Dilaudid (Hydromorphone Hcl)  Anticoagulation Management History:      The patient is taking warfarin and comes in today for a routine follow up visit.  Positive risk factors for bleeding include an age of 75 years or older and presence of serious comorbidities.  The bleeding index is 'intermediate risk'.  Positive CHADS2 values include History of HTN and Age > 75 years old.  The start date was 11/03/2002.  Her last INR was 7.0 RATIO.  Anticoagulation responsible provider: Emmily Pellegrin MD, Fayrene Fearing.  INR POC: 2.5.  Cuvette Lot#: [id number].  Exp: 12/2010.    Anticoagulation Management Assessment/Plan:      The patient's current anticoagulation dose is Warfarin sodium 2.5 mg tabs: Use as directed by Anticoagualtion Clinic.  The target INR is 2 - 3.  The next INR is due 12/25/2009.  Anticoagulation instructions were given to patient.  Results were reviewed/authorized by Bethena Midget, RN, BSN.  She was notified by Bethena Midget, RN, BSN.         Prior Anticoagulation Instructions: INR 2.7  Continue taking 1/2 tablet everyday except 1 tablet on Monday and Friday. Recheck in 4 weeks.   Current Anticoagulation Instructions: INR 2.5 Continue 1/2 pill everyday  except 1 pill on Mondays and Fridays. Recheck in 4 weeks.

## 2010-02-20 NOTE — Medication Information (Signed)
Summary: rov/ewj  Anticoagulant Therapy  Managed by: Weston Brass, PharmD Referring MD: Charlton Haws MD Supervising MD: Riley Kill MD, Maisie Fus Indication 1: Atrial Fibrillation (ICD-427.31) Lab Used: [facility] Site: [facility] INR POC 2.4 INR RANGE 2 - 3  Dietary changes: no    Health status changes: no    Bleeding/hemorrhagic complications: no    Recent/future hospitalizations: no    Any changes in medication regimen? no    Recent/future dental: no  Any missed doses?: no       Is patient compliant with meds? yes       Allergies: 1)  ! Melatonin (Melatonin) 2)  ! Ibuprofen 3)  ! Talwin 4)  ! Haldol 5)  ! Nortriptyline Hcl (Nortriptyline Hcl) 6)  ! Morphine 7)  ! * Antihistamines 8)  ! * Tizandine 9)  ! Dilaudid (Hydromorphone Hcl)  Anticoagulation Management History:      The patient is taking warfarin and comes in today for a routine follow up visit.  Positive risk factors for bleeding include an age of 60 years or older and presence of serious comorbidities.  The bleeding index is 'intermediate risk'.  Positive CHADS2 values include History of HTN and Age > 82 years old.  The start date was 11/03/2002.  Her last INR was 7.0 RATIO.  Anticoagulation responsible provider: Riley Kill MD, Maisie Fus.  INR POC: 2.4.  Cuvette Lot#: [id number].  Exp: 06/2010.    Anticoagulation Management Assessment/Plan:      The patient's current anticoagulation dose is Warfarin sodium 2.5 mg tabs: Use as directed by Anticoagualtion Clinic.  The target INR is 2 - 3.  The next INR is due 06/26/2009.  Anticoagulation instructions were given to patient.  Results were reviewed/authorized by Weston Brass, PharmD.  She was notified by Weston Brass PharmD.         Prior Anticoagulation Instructions: INR 2.6  Continue on same dosage 1/2 tablet daily except 1 tablet on Mondays and Fridays.  Recheck in 4 weeks.   Current Anticoagulation Instructions: INR 2.4  Continue same dose of 1/2 tablet every day  except 1 tablet on Monday and Friday

## 2010-02-20 NOTE — Medication Information (Signed)
Summary: rov/ewj  Anticoagulant Therapy  Managed by: Cloyde Reams, RN, BSN Referring MD: Charlton Haws MD Supervising MD: Clifton James MD, Cristal Deer Indication 1: Atrial Fibrillation (ICD-427.31) Lab Used: [facility] Site: [facility] INR POC 2.6 INR RANGE 2 - 3  Dietary changes: no    Health status changes: no    Bleeding/hemorrhagic complications: yes       Details: 1 episode of hemmroidal bleeding.   Recent/future hospitalizations: no    Any changes in medication regimen? no    Recent/future dental: no  Any missed doses?: no       Is patient compliant with meds? yes       Allergies: 1)  ! Melatonin (Melatonin) 2)  ! Ibuprofen 3)  ! Talwin 4)  ! Haldol 5)  ! Nortriptyline Hcl (Nortriptyline Hcl) 6)  ! Morphine 7)  ! * Antihistamines 8)  ! * Tizandine 9)  ! Dilaudid (Hydromorphone Hcl)  Anticoagulation Management History:      The patient is taking warfarin and comes in today for a routine follow up visit.  Positive risk factors for bleeding include an age of 75 years or older and presence of serious comorbidities.  The bleeding index is 'intermediate risk'.  Positive CHADS2 values include History of HTN and Age > 76 years old.  The start date was 11/03/2002.  Her last INR was 7.0 RATIO.  Anticoagulation responsible provider: Clifton James MD, Cristal Deer.  INR POC: 2.6.  Cuvette Lot#: [id number].  Exp: 05/2010.    Anticoagulation Management Assessment/Plan:      The patient's current anticoagulation dose is Warfarin sodium 2.5 mg tabs: Use as directed by Anticoagualtion Clinic.  The target INR is 2 - 3.  The next INR is due 05/29/2009.  Anticoagulation instructions were given to patient.  Results were reviewed/authorized by Cloyde Reams, RN, BSN.  She was notified by Cloyde Reams RN.         Prior Anticoagulation Instructions: INR 2.2  Continue on same dosage 1/2 tablet daily except 1 tablet on Mondays and Fridays.  Recheck in 4 weeks.    Current Anticoagulation  Instructions: INR 2.6  Continue on same dosage 1/2 tablet daily except 1 tablet on Mondays and Fridays.  Recheck in 4 weeks.

## 2010-02-20 NOTE — Medication Information (Signed)
Summary: rov.m  Anticoagulant Therapy  Managed by: Cloyde Reams, RN, BSN Referring MD: Charlton Haws MD Supervising MD: Antoine Poche MD, Fayrene Fearing Indication 1: Atrial Fibrillation (ICD-427.31) Lab Used: [facility]  Site: [facility] INR POC 2.2 INR RANGE 2 - 3  Dietary changes: no    Health status changes: no    Bleeding/hemorrhagic complications: no    Recent/future hospitalizations: no    Any changes in medication regimen? no    Recent/future dental: no  Any missed doses?: no       Is patient compliant with meds? yes       Allergies: 1)  ! Melatonin (Melatonin) 2)  ! Ibuprofen 3)  ! Talwin 4)  ! Haldol 5)  ! Nortriptyline Hcl (Nortriptyline Hcl) 6)  ! Morphine 7)  ! * Antihistamines 8)  ! * Tizandine 9)  ! Dilaudid (Hydromorphone Hcl)  Anticoagulation Management History:      The patient is taking warfarin and comes in today for a routine follow up visit.  Positive risk factors for bleeding include an age of 75 years or older and presence of serious comorbidities.  The bleeding index is 'intermediate risk'.  Positive CHADS2 values include History of HTN and Age > 75 years old.  The start date was 11/03/2002.  Her last INR was 7.0 RATIO.  Anticoagulation responsible provider: Antoine Poche MD, Fayrene Fearing.  INR POC: 2.2.  Cuvette Lot#: [id number].  Exp: 05/2010.    Anticoagulation Management Assessment/Plan:      The patient's current anticoagulation dose is Warfarin sodium 2.5 mg tabs: Use as directed by Anticoagualtion Clinic.  The target INR is 2 - 3.  The next INR is due 05/01/2009.  Anticoagulation instructions were given to patient.  Results were reviewed/authorized by Cloyde Reams, RN, BSN.  She was notified by Cloyde Reams RN.         Prior Anticoagulation Instructions: INR 2.3  Continue 0.5 tab daily except 1 tab on Mondays and Fridays.  Recheck in 4 weeks.    Current Anticoagulation Instructions: INR 2.2  Continue on same dosage 1/2 tablet daily except 1 tablet on  Mondays and Fridays.  Recheck in 4 weeks.

## 2010-02-20 NOTE — Progress Notes (Signed)
Summary: Lab Concerns  Phone Note Call from Patient Call back at Home Phone [phone]   Caller: Patient Summary of Call: Message left on VM: Patient received labs and seen where it said complete stool cards and she would if she had them.   I called patient and informed her that I will mail her some stool cards, verified address./[facility]  May 17, 2009 4:30 PM

## 2010-02-20 NOTE — Medication Information (Signed)
Summary: rov/mw  Anticoagulant Therapy  Managed by: Weston Brass, PharmD Referring MD: Charlton Haws MD PCP: Marga Melnick MD Supervising MD: Antoine Poche MD, Fayrene Fearing Indication 1: Atrial Fibrillation (ICD-427.31) Lab Used: [facility] Site: [facility] INR POC 2.7 INR RANGE 2 - 3  Dietary changes: no    Health status changes: no    Bleeding/hemorrhagic complications: no    Recent/future hospitalizations: no    Any changes in medication regimen? no    Recent/future dental: no  Any missed doses?: no       Is patient compliant with meds? yes       Allergies: 1)  ! Melatonin (Melatonin) 2)  ! Ibuprofen 3)  ! Talwin 4)  ! Haldol 5)  ! Nortriptyline Hcl (Nortriptyline Hcl) 6)  ! Morphine 7)  ! * Antihistamines 8)  ! * Tizandine 9)  ! Dilaudid (Hydromorphone Hcl)  Anticoagulation Management History:      The patient is taking warfarin and comes in today for a routine follow up visit.  Positive risk factors for bleeding include an age of 75 years or older and presence of serious comorbidities.  The bleeding index is 'intermediate risk'.  Positive CHADS2 values include History of HTN and Age > 21 years old.  The start date was 11/03/2002.  Her last INR was 7.0 RATIO.  Anticoagulation responsible provider: Antoine Poche MD, Fayrene Fearing.  INR POC: 2.7.  Cuvette Lot#: [id number].  Exp: 11/2010.    Anticoagulation Management Assessment/Plan:      The patient's current anticoagulation dose is Warfarin sodium 2.5 mg tabs: Use as directed by Anticoagualtion Clinic.  The target INR is 2 - 3.  The next INR is due 11/27/2009.  Anticoagulation instructions were given to patient.  Results were reviewed/authorized by Weston Brass, PharmD.  She was notified by Ilean Skill D candidate.         Prior Anticoagulation Instructions: INR 2.2 No changes to doses. Keep taking 1/2 tablet on everyday except on monday and friday take a whole tablet. See you in 4 weeks.  Current Anticoagulation Instructions: INR  2.7  Continue taking 1/2 tablet everyday except 1 tablet on Monday and Friday. Recheck in 4 weeks.

## 2010-02-20 NOTE — Medication Information (Signed)
Summary: rov  js  Anticoagulant Therapy  Managed by: Bethena Midget, RN  Referring MD: Charlton Haws MD Supervising MD: Antoine Poche MD, Fayrene Fearing Indication 1: Atrial Fibrillation (ICD-427.31) Lab Used: [facility] Site: [facility] INR POC 1.7 INR RANGE 2 - 3  Dietary changes: yes       Details: Had turnip greens last night which she doesn't usually eat.  Health status changes: yes       Details: Pt. had some chest pain yesterday but has subsided.  Bleeding/hemorrhagic complications: no    Recent/future hospitalizations: no    Any changes in medication regimen? no    Recent/future dental: no  Any missed doses?: no       Is patient compliant with meds? yes       Allergies: 1)  ! Melatonin (Melatonin) 2)  ! Ibuprofen 3)  ! Talwin 4)  ! Haldol 5)  ! Nortriptyline Hcl (Nortriptyline Hcl) 6)  ! Morphine 7)  ! * Antihistamines 8)  ! * Tizandine 9)  ! Dilaudid (Hydromorphone Hcl)  Anticoagulation Management History:      The patient is taking warfarin and comes in today for a routine follow up visit.  Positive risk factors for bleeding include an age of 17 years or older and presence of serious comorbidities.  The bleeding index is 'intermediate risk'.  Positive CHADS2 values include History of HTN and Age > 28 years old.  The start date was 11/03/2002.  Her last INR was 7.0 RATIO.  Anticoagulation responsible provider: Antoine Poche MD, Fayrene Fearing.  INR POC: 1.7.  Cuvette Lot#: [id number].  Exp: 04/2010.    Anticoagulation Management Assessment/Plan:      The patient's current anticoagulation dose is Warfarin sodium 2.5 mg tabs: Use as directed by Anticoagualtion Clinic.  The target INR is 2 - 3.  The next INR is due 03/06/2009.  Anticoagulation instructions were given to patient.  Results were reviewed/authorized by Bethena Midget, RN .  She was notified by Lew Dawes, PharmD Candidate.         Prior Anticoagulation Instructions: INR = 2.3  The patient is to continue with the same dose of  coumadin.  This dosage includes: 1/2 tablet every day except 1 tablet on Mondays and Fridays.  Recheck in 4 weeks.  Current Anticoagulation Instructions: INR 1.7  Take 1.5 tablets today then resume dose of 0.5 tablet daily except 1 tablet on Mondays and Fridays. Recheck in 3 weeks.

## 2010-02-20 NOTE — Medication Information (Signed)
Summary: Protonix/[facility]  Protonix/[facility]   Imported By: Lanelle Bal 03/01/2009 10:55:53  _____________________________________________________________________  External Attachment:    Type:   Image     Comment:   External Document

## 2010-02-20 NOTE — Assessment & Plan Note (Signed)
Summary: 6 month rov/sl    History of Present Illness: Lindsey [name] is seen today for f/U for chronic afib. Despite her age she is a good coumadin candidate.  She drove herself to the office today and walks well with a cane.  She has not had any palpitations, PND or orthopne, TIA or syncope.  She has chronic LE edema from varicosities and this is stable on aldactone.  Current Medications (verified): 1)  Spironolactone 25 Mg  Tabs (Spironolactone) .[name].. 1 By Mouth Once Daily 2)  Align   Caps (Misc Intestinal Flora Regulat) .[name].. 1 By Mouth Qd 3)  Diazepam 5 Mg  Tabs (Diazepam) .... 1/2-1 By Mouth Prn 4)  Miralax   Powd (Polyethylene Glycol 3350) .... Prn 5)  Vitamin B-6 .... 1 Tab By Mouth Once Daily 6)  Toprol Xl 25 Mg  Tb24 (Metoprolol Succinate) .[name].. 1 By Mouth Qd 7)  Caltrate 600 1500 Mg Tabs (Calcium Carbonate) .[name].. 1 Tab By Mouth Two Times A Day 8)  Hydrocodone-Acetaminophen 5-325 Mg  Tabs (Hydrocodone-Acetaminophen) .[name].. 1 By Mouth Q4-6h 9)  Neurontin 100 Mg  Caps (Gabapentin) .[name].. 1 By Mouth Qhs 10)  Ambien Cr 6.25 Mg  Tbcr (Zolpidem Tartrate) .[name].. 1 By Mouth Qhs 11)  Protonix 40 Mg  Tbec (Pantoprazole Sodium) .[name].. 1 By Mouth Once Daily 12)  Premarin 0.625 Mg Tabs (Estrogens Conjugated) .[name].. 1 By Mouth Once Daily 13)  Iron 325 (65 Fe) Mg Tabs (Ferrous Sulfate) .[name].. 1 Tab By Mouth Once Daily 14)  Warfarin Sodium 2.5 Mg Tabs (Warfarin Sodium) .... Use As Directed By Anticoagualtion Clinic 15)  Vitamin D3 50000 Unit Caps (Cholecalciferol) .[name].. 1 Weekly 16)  Aleve 220 Mg Tabs (Naproxen Sodium) .... As Needed 17)  Aspercreme/aloe 10 % Crea (Trolamine Salicylate) .... As Directed 18)  Vitamin B-12 100 Mcg Tabs (Cyanocobalamin) .[name].. 1 By Mouth Once Daily  Allergies: 1)  ! Melatonin (Melatonin) 2)  ! Ibuprofen 3)  ! Talwin 4)  ! Haldol 5)  ! Nortriptyline Hcl (Nortriptyline Hcl) 6)  ! Morphine 7)  ! * Antihistamines 8)  ! * Tizandine 9)  ! Dilaudid (Hydromorphone Hcl)  Past  History:  Past Medical History: Last updated: 02/27/2009 Atrial Fibrillation with bradycardia diverticulitis, PMH of 2008 G E R D Osteoporosis (T score -3.9 @ forearm) Vitamin D deficiency (Vit D 27 in 2010) Diverticulosis, colon 2003 Anemia-NOS, PMH of   Past Surgical History: Last updated: 02/27/2009 Duodenal ulcer surgery 1976 Appendectomy Cholecystectomy Colonoscopy 02/2006 : internal  hemorrhoids Total hip replacement 02/2008, Dr Despina Hick  Family History: Last updated: 12-Mar-2008 Father:decease age 76 emphysema Mother:decease age 53 old age Siblings:1 brother diverticulosis 1 brother s/p CABG  Family History of Cancer: 1 sister breast ca survivor  Social History: Last updated: 03-12-2008 Married  Tobacco Use - No.  Alcohol Use - no Drug Use - no  Review of Systems       Denies fever, malais, weight loss, blurry vision, decreased visual acuity, cough, sputum, SOB, hemoptysis, pleuritic pain, palpitaitons, heartburn, abdominal pain, melena,  claudication, or rash.   Vital Signs:  Patient profile:   77 year old female Height:      66 inches Weight:      173 pounds Pulse rate:   69 / minute Pulse rhythm:   irregularly irregular Resp:     18 per minute BP sitting:   145 / 77  (left arm)  Vitals Entered By: Kem Parkinson (March 31, 2009 10:17 AM)  Physical Exam  General:  Affect appropriate Healthy:  appears stated age HEENT: normal Neck supple with no adenopathy JVP normal no bruits no thyromegaly Lungs clear with no wheezing and good diaphragmatic motion Heart:  S1/S2 no murmur,rub, gallop or click PMI normal Abdomen: benighn, BS positve, no tenderness, no AAA no bruit.  No HSM or HJR Distal pulses intact with no bruits No edema Neuro non-focal Skin warm and dry    Impression & Recommendations:  Problem # 1:  HYPERTENSION (ICD-401.9) Well controlled continue BB and diuretic Her updated medication list for this problem includes:     Spironolactone 25 Mg Tabs (Spironolactone) .[name]... 1 by mouth once daily    Toprol Xl 25 Mg Tb24 (Metoprolol succinate) .[name]... 1 by mouth qd  Problem # 2:  VENOUS INSUFFICIENCY, CHRONIC, HX OF (ICD-V12.59) Continue low sodium diet and diruetic.  Elevate legs at end of day Her updated medication list for this problem includes:    Toprol Xl 25 Mg Tb24 (Metoprolol succinate) .[name]... 1 by mouth qd    Warfarin Sodium 2.5 Mg Tabs (Warfarin sodium) ..... Use as directed by anticoagualtion clinic  Problem # 3:  ATRIAL FIBRILLATION (ICD-427.31) Good rate control no palpitations or symptoms Her updated medication list for this problem includes:    Toprol Xl 25 Mg Tb24 (Metoprolol succinate) .[name]... 1 by mouth qd    Warfarin Sodium 2.5 Mg Tabs (Warfarin sodium) ..... Use as directed by anticoagualtion clinic  Problem # 4:  COUMADIN THERAPY (ICD-V58.61) Good candidate to continue.  INR's Rx. no need to switch to Pradaxa as her dose has been very stable  Patient Instructions: 1)  Your physician recommends that you schedule a follow-up appointment in: 6 MONTHS   EKG Report  Procedure date:  03/31/2009  Findings:      Afib Nonspecific ST/T wave changes Abnormal ECG

## 2010-02-20 NOTE — Assessment & Plan Note (Signed)
Summary: F6M/DM      Allergies Added:   Visit Type:  Follow-up Primary Provider:  Marga Melnick MD  CC:  no complaints.  History of Present Illness: Lindsey [name] is seen today for f/U for chronic afib. Despite her age she is a good coumadin candidate.  She drove herself to the office today and walks well with a cane.  She has not had any palpitations, PND or orthopne, TIA or syncope.  She has chronic LE edema from varicosities and this is stable on aldactone.  Current Problems (verified): 1)  Gerd  (ICD-530.81) 2)  Anemia-nos  (ICD-285.9) 3)  Anemia  (ICD-285.9) 4)  Hypertension  (ICD-401.9) 5)  Diverticulosis, Colon  (ICD-562.10) 6)  Vitamin D Deficiency  (ICD-268.9) 7)  Osteoporosis  (ICD-733.00) 8)  Venous Insufficiency, Chronic, Hx of  (ICD-V12.59) 9)  Coumadin Therapy  (ICD-V58.61) 10)  Atrial Fibrillation  (ICD-427.31) 11)  Icd or Lead Infection  (ICD-996.61)  Current Medications (verified): 1)  Spironolactone 25 Mg  Tabs (Spironolactone) .[name].. 1 By Mouth Once Daily 2)  Align   Caps (Misc Intestinal Flora Regulat) .[name].. 1 By Mouth Qd 3)  Diazepam 5 Mg  Tabs (Diazepam) .... 1/2-1 By Mouth Prn 4)  Miralax   Powd (Polyethylene Glycol 3350) .... Prn 5)  Vitamin B-6 .... 1 Tab By Mouth Once Daily 6)  Toprol Xl 25 Mg  Tb24 (Metoprolol Succinate) .[name].. 1 By Mouth Qd 7)  Caltrate 600 1500 Mg Tabs (Calcium Carbonate) .[name].. 1 Tab By Mouth Two Times A Day 8)  Hydrocodone-Acetaminophen 5-325 Mg  Tabs (Hydrocodone-Acetaminophen) .[name].. 1 By Mouth Q4-6h 9)  Neurontin 100 Mg  Caps (Gabapentin) .[name].. 1 By Mouth Qhs 10)  Pantoprazole Sodium 40 Mg Tbec .[name].. 1 By Mouth Once Daily 11)  Premarin 0.625 Mg Tabs (Estrogens Conjugated) .[name].. 1 By Mouth Once Daily 12)  Iron 325 (65 Fe) Mg Tabs (Ferrous Sulfate) .[name].. 1 Tab By Mouth Once Daily 13)  Warfarin Sodium 2.5 Mg Tabs (Warfarin Sodium) .... Use As Directed By Anticoagualtion Clinic 14)  Vitamin D3 2000 Unit Caps (Cholecalciferol) .[name].. 1 By Mouth Once  Daily 15)  Aleve 220 Mg Tabs (Naproxen Sodium) .... As Needed 16)  Aspercreme/aloe 10 % Crea (Trolamine Salicylate) .... As Directed 17)  Vitamin B-12 200 Mcg Tabs (Cyanocobalamin) .[name].. 1 By Mouth Once Daily 18)  Multivitamins  Tabs (Multiple Vitamin) .[name].. 1 By Mouth Once Daily 19)  Maalox .... As Needed 20)  Stool Softner .[name].. 1 By Mouth Once Daily  Allergies (verified): 1)  ! Melatonin (Melatonin) 2)  ! Ibuprofen 3)  ! Talwin 4)  ! Haldol 5)  ! Nortriptyline Hcl (Nortriptyline Hcl) 6)  ! Morphine 7)  ! * Antihistamines 8)  ! * Tizandine 9)  ! Dilaudid (Hydromorphone Hcl)  Past History:  Past Medical History: Last updated: 02/27/2009 Atrial Fibrillation with bradycardia diverticulitis, PMH of 2008 G E R D Osteoporosis (T score -3.9 @ forearm) Vitamin D deficiency (Vit D 27 in 2010) Diverticulosis, colon 2003 Anemia-NOS, PMH of   Past Surgical History: Last updated: 02/27/2009 Duodenal ulcer surgery 1976 Appendectomy Cholecystectomy Colonoscopy 02/2006 : internal  hemorrhoids Total hip replacement 02/2008, Dr Despina Hick  Family History: Last updated: 2008/03/23 Father:decease age 2 emphysema Mother:decease age 25 old age Siblings:1 brother diverticulosis 1 brother s/p CABG  Family History of Cancer: 1 sister breast ca survivor  Social History: Last updated: Mar 23, 2008 Married  Tobacco Use - No.  Alcohol Use - no Drug Use - no  Review of Systems  Denies fever, malais, weight loss, blurry vision, decreased visual acuity, cough, sputum, SOB, hemoptysis, pleuritic pain, palpitaitons, heartburn, abdominal pain, melena, lower extremity edema, claudication, or rash.   Vital Signs:  Patient profile:   77 year old female Height:      66 inches Weight:      181 pounds BMI:     29.32 Pulse rate:   74 / minute BP sitting:   145 / 80  (left arm) Cuff size:   large  Vitals Entered By: Burnett Kanaris, CNA (October 03, 2009 2:43 PM)  Physical Exam  General:   Affect appropriate Healthy:  appears stated age HEENT: normal Neck supple with no adenopathy JVP normal no bruits no thyromegaly Lungs clear with no wheezing and good diaphragmatic motion Heart:  S1/S2 no murmur,rub, gallop or click PMI normal Abdomen: benighn, BS positve, no tenderness, no AAA no bruit.  No HSM or HJR Distal pulses intact with no bruits No edema Neuro non-focal Skin warm and dry    Impression & Recommendations:  Problem # 1:  GERD (ICD-530.81) Recent hospitalization with relief.  Previous bleeding ulcer.  Avoid ASA  Problem # 2:  HYPERTENSION (ICD-401.9) Well controlled continue current meds Her updated medication list for this problem includes:    Spironolactone 25 Mg Tabs (Spironolactone) .[name]... 1 by mouth once daily    Toprol Xl 25 Mg Tb24 (Metoprolol succinate) .[name]... 1 by mouth qd  Problem # 3:  VENOUS INSUFFICIENCY, CHRONIC, HX OF (ICD-V12.59) Varicose veins.  Dependant edema.  Continue current dose of aldactone Her updated medication list for this problem includes:    Toprol Xl 25 Mg Tb24 (Metoprolol succinate) .[name]... 1 by mouth qd    Warfarin Sodium 2.5 Mg Tabs (Warfarin sodium) ..... Use as directed by anticoagualtion clinic  Problem # 4:  ATRIAL FIBRILLATION (ICD-427.31) Good rate control Despite age still a good candidate for anticoagulation.  F/U clinic  INR today 2.2 Her updated medication list for this problem includes:    Toprol Xl 25 Mg Tb24 (Metoprolol succinate) .[name]... 1 by mouth qd    Warfarin Sodium 2.5 Mg Tabs (Warfarin sodium) ..... Use as directed by anticoagualtion clinic  Patient Instructions: 1)  Your physician recommends that you schedule a follow-up appointment in: 6 MONTHS WITH DR Eden Emms 2)  Your physician recommends that you continue on your current medications as directed. Please refer to the Current Medication list given to you today.

## 2010-02-20 NOTE — Assessment & Plan Note (Signed)
Summary: HOPS F/U CHEST PAIN/CBS   Vital Signs:  Patient profile:   77 year old female Weight:      181 pounds BMI:     29.32 Pulse rate:   56 / minute Resp:     17 per minute BP sitting:   112 / 70  (left arm) Cuff size:   large  Vitals Entered By: Shonna Chock CMA (August 11, 2009 4:15 PM) CC: Hospital follow-up: doing well  and refill protonix (90 days supply)   CC:  Hospital follow-up: doing well  and refill protonix (90 days supply).  History of Present Illness: D/C Summary : chest pain due to ERD & AF. Since then no recurrence. On PPI once daily .  Current Medications (verified): 1)  Spironolactone 25 Mg  Tabs (Spironolactone) .[name].. 1 By Mouth Once Daily 2)  Align   Caps (Misc Intestinal Flora Regulat) .[name].. 1 By Mouth Qd 3)  Diazepam 5 Mg  Tabs (Diazepam) .... 1/2-1 By Mouth Prn 4)  Miralax   Powd (Polyethylene Glycol 3350) .... Prn 5)  Vitamin B-6 .... 1 Tab By Mouth Once Daily 6)  Toprol Xl 25 Mg  Tb24 (Metoprolol Succinate) .[name].. 1 By Mouth Qd 7)  Caltrate 600 1500 Mg Tabs (Calcium Carbonate) .[name].. 1 Tab By Mouth Two Times A Day 8)  Hydrocodone-Acetaminophen 5-325 Mg  Tabs (Hydrocodone-Acetaminophen) .[name].. 1 By Mouth Q4-6h 9)  Neurontin 100 Mg  Caps (Gabapentin) .[name].. 1 By Mouth Qhs 10)  Protonix 40 Mg  Tbec (Pantoprazole Sodium) .[name].. 1 By Mouth Once Daily 11)  Premarin 0.625 Mg Tabs (Estrogens Conjugated) .[name].. 1 By Mouth Once Daily 12)  Iron 325 (65 Fe) Mg Tabs (Ferrous Sulfate) .[name].. 1 Tab By Mouth Once Daily 13)  Warfarin Sodium 2.5 Mg Tabs (Warfarin Sodium) .... Use As Directed By Anticoagualtion Clinic 14)  Vitamin D3 2000 Unit Caps (Cholecalciferol) .[name].. 1 By Mouth Once Daily 15)  Aleve 220 Mg Tabs (Naproxen Sodium) .... As Needed 16)  Aspercreme/aloe 10 % Crea (Trolamine Salicylate) .... As Directed 17)  Vitamin B-12 200 Mcg Tabs (Cyanocobalamin) .[name].. 1 By Mouth Once Daily 18)  Multivitamins  Tabs (Multiple Vitamin) .[name].. 1 By Mouth Once Daily 19)  Maalox .... As  Needed 20)  Stool Softner .[name].. 1 By Mouth Once Daily  Allergies: 1)  ! Melatonin (Melatonin) 2)  ! Ibuprofen 3)  ! Talwin 4)  ! Haldol 5)  ! Nortriptyline Hcl (Nortriptyline Hcl) 6)  ! Morphine 7)  ! * Antihistamines 8)  ! * Tizandine 9)  ! Dilaudid (Hydromorphone Hcl)  Review of Systems ENT:  Denies difficulty swallowing and hoarseness. CV:  Complains of swelling of feet; denies chest pain or discomfort, difficulty breathing at night, difficulty breathing while lying down, palpitations, and shortness of breath with exertion. GI:  Denies abdominal pain, bloody stools, dark tarry stools, and indigestion.  Physical Exam  General:  in no acute distress; alert,appropriate and cooperative throughout examination Eyes:  Arcus; no icterus Mouth:  Oral mucosa and oropharynx without lesions or exudates.  No erythema Lungs:  Normal respiratory effort, chest expands symmetrically. Lungs are clear to auscultation, no crackles or wheezes. Heart:  bradycardia and  slightly irregular rhythm.   Abdomen:  Bowel sounds positive,abdomen soft and non-tender without masses, organomegaly or hernias noted. Extremities:  Lipedema  bilaterally ; venous spiders.     Impression & Recommendations:  Problem # 1:  GERD (ICD-530.81)  Complete Medication List: 1)  Spironolactone 25 Mg Tabs (Spironolactone) .Marland KitchenMarland KitchenMarland Kitchen 1  by mouth once daily 2)  Align Caps (Misc intestinal flora regulat) .[name].. 1 by mouth qd 3)  Diazepam 5 Mg Tabs (Diazepam) .... 1/2-1 by mouth prn 4)  Miralax Powd (Polyethylene glycol 3350) .... Prn 5)  Vitamin B-6  .... 1 tab by mouth once daily 6)  Toprol Xl 25 Mg Tb24 (Metoprolol succinate) .[name].. 1 by mouth qd 7)  Caltrate 600 1500 Mg Tabs (Calcium carbonate) .[name].. 1 tab by mouth two times a day 8)  Hydrocodone-acetaminophen 5-325 Mg Tabs (Hydrocodone-acetaminophen) .[name].. 1 by mouth q4-6h 9)  Neurontin 100 Mg Caps (Gabapentin) .[name].. 1 by mouth qhs 10)  Pantoprazole Sodium 40 Mg Tbec  .[name].. 1 by mouth  once daily 11)  Premarin 0.625 Mg Tabs (Estrogens conjugated) .[name].. 1 by mouth once daily 12)  Iron 325 (65 Fe) Mg Tabs (Ferrous sulfate) .[name].. 1 tab by mouth once daily 13)  Warfarin Sodium 2.5 Mg Tabs (Warfarin sodium) .... Use as directed by anticoagualtion clinic 14)  Vitamin D3 2000 Unit Caps (Cholecalciferol) .[name].. 1 by mouth once daily 15)  Aleve 220 Mg Tabs (Naproxen sodium) .... As needed 16)  Aspercreme/aloe 10 % Crea (Trolamine salicylate) .... As directed 17)  Vitamin B-12 200 Mcg Tabs (cyanocobalamin)  .[name].. 1 by mouth once daily 18)  Multivitamins Tabs (Multiple vitamin) .[name].. 1 by mouth once daily 19)  Maalox  .... As needed 20)  Stool Softner  .[name].. 1 by mouth once daily  Patient Instructions: 1)  Report Warning Signs as discussed.Avoid foods high in acid (tomatoes, citrus juices, spicy foods). Avoid eating within two hours of lying down or before exercising. Do not over eat; try smaller more frequent meals. Elevate head of bed twelve inches when sleeping. Prescriptions: PANTOPRAZOLE SODIUM 40 MG TBEC 1 by mouth once daily  #90 x 3   Entered and Authorized by:   Marga Melnick MD   Signed by:   Marga Melnick MD on 08/11/2009   Method used:   Printed then faxed to ...       [facility] (mail[facility])       [address] [place], [place]  [address]       Ph: [phone]       Fax: [phone]   RxID:   [id number]

## 2010-02-20 NOTE — Medication Information (Signed)
Summary: rov/sp  Anticoagulant Therapy  Managed by: Bethena Midget, RN, BSN Referring MD: Charlton Haws MD Supervising MD: Antoine Poche MD, Fayrene Fearing Indication 1: Atrial Fibrillation (ICD-427.31) Lab Used: [facility] Site: [facility] INR POC 2.1 INR RANGE 2 - 3  Dietary changes: no    Health status changes: no    Bleeding/hemorrhagic complications: no    Recent/future hospitalizations: no    Any changes in medication regimen? no    Recent/future dental: no  Any missed doses?: no       Is patient compliant with meds? yes       Allergies: 1)  ! Melatonin (Melatonin) 2)  ! Ibuprofen 3)  ! Talwin 4)  ! Haldol 5)  ! Nortriptyline Hcl (Nortriptyline Hcl) 6)  ! Morphine 7)  ! * Antihistamines 8)  ! * Tizandine 9)  ! Dilaudid (Hydromorphone Hcl)  Anticoagulation Management History:      The patient is taking warfarin and comes in today for a routine follow up visit.  Positive risk factors for bleeding include an age of 29 years or older and presence of serious comorbidities.  The bleeding index is 'intermediate risk'.  Positive CHADS2 values include History of HTN and Age > 75 years old.  The start date was 11/03/2002.  Her last INR was 7.0 RATIO.  Anticoagulation responsible provider: Antoine Poche MD, Fayrene Fearing.  INR POC: 2.1.  Cuvette Lot#: [id number].  Exp: 08/2010.    Anticoagulation Management Assessment/Plan:      The patient's current anticoagulation dose is Warfarin sodium 2.5 mg tabs: Use as directed by Anticoagualtion Clinic.  The target INR is 2 - 3.  The next INR is due 07/31/2009.  Anticoagulation instructions were given to patient.  Results were reviewed/authorized by Bethena Midget, RN, BSN.  She was notified by Bethena Midget, RN, BSN.         Prior Anticoagulation Instructions: INR 2.4  Continue same dose of 1/2 tablet every day except 1 tablet on Monday and Friday   Current Anticoagulation Instructions: INR 2.1 Continue 1/2 pill everyday except 1 pill on Mondays and Fridays.  Recheck in 4 weeks.

## 2010-02-20 NOTE — Medication Information (Signed)
Summary: rov/ewj  Anticoagulant Therapy  Managed by: Weston Brass, PharmD Referring MD: Charlton Haws MD Supervising MD: Clifton James MD, Cristal Deer Indication 1: Atrial Fibrillation (ICD-427.31) Lab Used: [facility] Site: [facility] INR POC 2.0 INR RANGE 2 - 3  Dietary changes: no    Health status changes: yes       Details: Episode of chest pain last week similar to event in June, resolved with Maalox  Bleeding/hemorrhagic complications: no    Recent/future hospitalizations: yes       Details: Hospitalized at end of June for chest pain, determined to be acid reflux  Any changes in medication regimen? no    Recent/future dental: no  Any missed doses?: no       Is patient compliant with meds? yes       Allergies: 1)  ! Melatonin (Melatonin) 2)  ! Ibuprofen 3)  ! Talwin 4)  ! Haldol 5)  ! Nortriptyline Hcl (Nortriptyline Hcl) 6)  ! Morphine 7)  ! * Antihistamines 8)  ! * Tizandine 9)  ! Dilaudid (Hydromorphone Hcl)  Anticoagulation Management History:      The patient is taking warfarin and comes in today for a routine follow up visit.  Positive risk factors for bleeding include an age of 6 years or older and presence of serious comorbidities.  The bleeding index is 'intermediate risk'.  Positive CHADS2 values include History of HTN and Age > 15 years old.  The start date was 11/03/2002.  Her last INR was 7.0 RATIO.  Anticoagulation responsible provider: Clifton James MD, Cristal Deer.  INR POC: 2.0.  Cuvette Lot#: [id number].  Exp: 10/2010.    Anticoagulation Management Assessment/Plan:      The patient's current anticoagulation dose is Warfarin sodium 2.5 mg tabs: Use as directed by Anticoagualtion Clinic.  The target INR is 2 - 3.  The next INR is due 10/02/2009.  Anticoagulation instructions were given to patient.  Results were reviewed/authorized by Weston Brass, PharmD.  She was notified by Liana Gerold, PharmD Candidate.         Prior Anticoagulation Instructions: INR  2.0  Continue on same dosage 1/2 tablet daily except 1 tablet on Mondays and Fridays.  Recheck in 4 weeks.   Current Anticoagulation Instructions: INR 2.0  Continue 1/2 tablet daily except  1 tablet Mon and Fri.  Return to clinic in 5 weeks.

## 2010-02-20 NOTE — Progress Notes (Signed)
Summary: refill  Phone Note Refill Request Message from:  Patient  Refills Requested: Medication #1:  PANTOPRAZOLE SODIUM 40 MG TBEC 1 by mouth once daily [facility]   Initial call taken by: Okey Regal [place],  September 12, 2009 9:00 AM    Prescriptions: PANTOPRAZOLE SODIUM 40 MG TBEC 1 by mouth once daily  #90 x 3   Entered and Authorized by:   Shonna Chock CMA   Signed by:   Shonna Chock CMA on 09/12/2009   Method used:   Faxed to ...       [facility] ([facility])       [address] [place], [place]  [address]       Ph: [phone]       Fax: [phone]   RxID:   [id number]

## 2010-02-20 NOTE — Assessment & Plan Note (Signed)
Summary: roa per pt//lh//Vit D level (268.9)/KDC   Vital Signs:  Patient profile:   77 year old female Weight:      173.2 pounds Temp:     98.7 degrees F oral Pulse rate:   60 / minute Resp:     17 per minute BP sitting:   140 / 80  (left arm) Cuff size:   large  Vitals Entered By: Shonna Chock (February 27, 2009 9:26 AM) CC: 1.) Follow-Up, Check Vit D level, RX for Miralax powder and protonix Comments REVIEWED MED LIST, PATIENT AGREED DOSE AND INSTRUCTION CORRECT    CC:  1.) Follow-Up, Check Vit D level, and RX for Miralax powder and protonix.  History of Present Illness: ERD is essentially controlled by Protonix. Miralax taken as needed , not daily. Also now on stool softener with benefit. She is on 50,000 International Units once weekly.  Allergies: 1)  ! Melatonin (Melatonin) 2)  ! Ibuprofen 3)  ! Talwin 4)  ! Haldol 5)  ! Nortriptyline Hcl (Nortriptyline Hcl) 6)  ! Morphine 7)  ! * Antihistamines 8)  ! * Tizandine 9)  ! Dilaudid (Hydromorphone Hcl)  Past History:  Past Medical History: Atrial Fibrillation with bradycardia diverticulitis, PMH of 2008 G E R D Osteoporosis (T score -3.9 @ forearm) Vitamin D deficiency (Vit D 27 in 2010) Diverticulosis, colon 2003 Anemia-NOS, PMH of   Past Surgical History: Duodenal ulcer surgery 1976 Appendectomy Cholecystectomy Colonoscopy 02/2006 : internal  hemorrhoids Total hip replacement 02/2008, Dr Despina Hick  Review of Systems General:  Complains of sweats; denies chills, fever, and weight loss; Occa am sweats. ENT:  Denies difficulty swallowing and hoarseness. GI:  Complains of constipation; denies abdominal pain, bloody stools, dark tarry stools, and diarrhea. MS:  Complains of joint pain; denies joint redness, joint swelling, and muscle aches; Arthritis mainly in hands. Vitamin D was 27 in 07/2008.[name]  Physical Exam  General:  in no acute distress; alert,appropriate and cooperative throughout examination Lungs:   Normal respiratory effort, chest expands symmetrically. Lungs are clear to auscultation, no crackles or wheezes. Heart:  bradycardia and irregular rhythm.   Abdomen:  Bowel sounds positive,abdomen soft and non-tender without masses, organomegaly or hernias noted. Pulses:  Decreased pedal pulses Extremities:  1+ left pedal edema and 1+ right pedal edema.   Marked DJD of hands; using cane ("when out of my home") Neurologic:  alert & oriented X3.   Skin:  Intact without suspicious lesions or rashes; pallor suggested Cervical Nodes:  No lymphadenopathy noted Axillary Nodes:  No palpable lymphadenopathy Psych:  normally interactive and good eye contact.     Impression & Recommendations:  Problem # 1:  HYPERTENSION (ICD-401.9)  Adequate control Her updated medication list for this problem includes:    Spironolactone 25 Mg Tabs (Spironolactone) .[name]... 1 by mouth once daily    Toprol Xl 25 Mg Tb24 (Metoprolol succinate) .[name]... 1 by mouth qd  Orders: Venipuncture (01093) TLB-Creatinine, Blood (82565-CREA) TLB-Potassium (K+) (84132-K) TLB-BUN (Urea Nitrogen) (84520-BUN)  Problem # 2:  VITAMIN D DEFICIENCY (ICD-268.9) on 50,000 International Units weekly Orders: Venipuncture (23557) T-Vitamin D (25-Hydroxy) (32202-54270)  Problem # 3:  ATRIAL FIBRILLATION (ICD-427.31) Rate controlled Her updated medication list for this problem includes:    Toprol Xl 25 Mg Tb24 (Metoprolol succinate) .[name]... 1 by mouth qd    Warfarin Sodium 2.5 Mg Tabs (Warfarin sodium) ..... Use as directed by anticoagualtion clinic  Problem # 4:  ANEMIA (ICD-285.9) PMH of  Her updated medication list  for this problem includes:    Iron 325 (65 Fe) Mg Tabs (Ferrous sulfate) .[name]... 1 tab by mouth once daily    Vitamin B-12 100 Mcg Tabs (Cyanocobalamin) .[name]... 1 by mouth once daily  Orders: Venipuncture (96789) TLB-CBC Platelet - w/Differential (85025-CBCD)  Complete Medication List: 1)  Spironolactone 25 Mg Tabs  (Spironolactone) .[name].. 1 by mouth once daily 2)  Align Caps (Misc intestinal flora regulat) .[name].. 1 by mouth qd 3)  Diazepam 5 Mg Tabs (Diazepam) .... 1/2-1 by mouth prn 4)  Miralax Powd (Polyethylene glycol 3350) .... Prn 5)  Vitamin B-6  .... 1 tab by mouth once daily 6)  Toprol Xl 25 Mg Tb24 (Metoprolol succinate) .[name].. 1 by mouth qd 7)  Caltrate 600 1500 Mg Tabs (Calcium carbonate) .[name].. 1 tab by mouth two times a day 8)  Hydrocodone-acetaminophen 5-325 Mg Tabs (Hydrocodone-acetaminophen) .[name].. 1 by mouth q4-6h 9)  Neurontin 100 Mg Caps (Gabapentin) .[name].. 1 by mouth qhs 10)  Ambien Cr 6.25 Mg Tbcr (Zolpidem tartrate) .[name].. 1 by mouth qhs 11)  Protonix 40 Mg Tbec (Pantoprazole sodium) .[name].. 1 by mouth once daily 12)  Premarin 0.625 Mg Tabs (Estrogens conjugated) .[name].. 1 by mouth once daily 13)  Iron 325 (65 Fe) Mg Tabs (Ferrous sulfate) .[name].. 1 tab by mouth once daily 14)  Warfarin Sodium 2.5 Mg Tabs (Warfarin sodium) .... Use as directed by anticoagualtion clinic 15)  Vitamin D3 50000 Unit Caps (cholecalciferol)  .Marland KitchenMarland KitchenMarland Kitchen 1 weekly 16)  Aleve 220 Mg Tabs (Naproxen sodium) .... As needed 17)  Aspercreme/aloe 10 % Crea (Trolamine salicylate) .... As directed 18)  Vitamin B-12 100 Mcg Tabs (Cyanocobalamin) .[name].. 1 by mouth once daily  Patient Instructions: 1)  Vitamin D dose will determined by lab results.

## 2010-02-20 NOTE — Medication Information (Signed)
Summary: rov/tm  Anticoagulant Therapy  Managed by: Cloyde Reams, RN, BSN Referring MD: Charlton Haws MD Supervising MD: Tenny Craw MD, Gunnar Fusi Indication 1: Atrial Fibrillation (ICD-427.31) Lab Used: [facility] Site: [facility] INR POC 2.0 INR RANGE 2 - 3  Dietary changes: no    Health status changes: no    Bleeding/hemorrhagic complications: no    Recent/future hospitalizations: yes       Details: Pt was in hospital 6/29-6/30 with CP, dx as acid reflux.  Any changes in medication regimen? yes       Details: Prn Malox  Recent/future dental: no  Any missed doses?: no       Is patient compliant with meds? yes       Allergies: 1)  ! Melatonin (Melatonin) 2)  ! Ibuprofen 3)  ! Talwin 4)  ! Haldol 5)  ! Nortriptyline Hcl (Nortriptyline Hcl) 6)  ! Morphine 7)  ! * Antihistamines 8)  ! * Tizandine 9)  ! Dilaudid (Hydromorphone Hcl)  Anticoagulation Management History:      The patient is taking warfarin and comes in today for a routine follow up visit.  Positive risk factors for bleeding include an age of 11 years or older and presence of serious comorbidities.  The bleeding index is 'intermediate risk'.  Positive CHADS2 values include History of HTN and Age > 75 years old.  The start date was 11/03/2002.  Her last INR was 7.0 RATIO.  Anticoagulation responsible provider: Tenny Craw MD, Gunnar Fusi.  INR POC: 2.0.  Cuvette Lot#: [id number].  Exp: 09/2010.    Anticoagulation Management Assessment/Plan:      The patient's current anticoagulation dose is Warfarin sodium 2.5 mg tabs: Use as directed by Anticoagualtion Clinic.  The target INR is 2 - 3.  The next INR is due 08/28/2009.  Anticoagulation instructions were given to patient.  Results were reviewed/authorized by Cloyde Reams, RN, BSN.  She was notified by Cloyde Reams, RN, BSN.         Prior Anticoagulation Instructions: INR 2.1 Continue 1/2 pill everyday except 1 pill on Mondays and Fridays. Recheck in 4 weeks.   Current  Anticoagulation Instructions: INR 2.0  Continue on same dosage 1/2 tablet daily except 1 tablet on Mondays and Fridays.  Recheck in 4 weeks.

## 2010-02-22 NOTE — Medication Information (Signed)
Summary: rov/sp  Anticoagulant Therapy  Managed by: Weston Brass, PharmD Referring MD: Charlton Haws MD PCP: Marga Melnick MD Supervising MD: Excell Seltzer MD, Casimiro Needle Indication 1: Atrial Fibrillation (ICD-427.31) Lab Used: [facility] Site: [facility] INR POC 2.1 INR RANGE 2 - 3  Dietary changes: no    Health status changes: no    Bleeding/hemorrhagic complications: no    Recent/future hospitalizations: no    Any changes in medication regimen? no    Recent/future dental: no  Any missed doses?: no       Is patient compliant with meds? yes       Allergies: 1)  ! Melatonin (Melatonin) 2)  ! Ibuprofen 3)  ! Talwin 4)  ! Haldol 5)  ! Nortriptyline Hcl (Nortriptyline Hcl) 6)  ! Morphine 7)  ! * Antihistamines 8)  ! * Tizandine 9)  ! Dilaudid (Hydromorphone Hcl)  Anticoagulation Management History:      The patient is taking warfarin and comes in today for a routine follow up visit.  Positive risk factors for bleeding include an age of 75 years or older and presence of serious comorbidities.  The bleeding index is 'intermediate risk'.  Positive CHADS2 values include History of HTN and Age > 66 years old.  The start date was 11/03/2002.  Her last INR was 7.0 RATIO.  Anticoagulation responsible provider: Excell Seltzer MD, Casimiro Needle.  INR POC: 2.1.  Cuvette Lot#: [id number].  Exp: 02/2011.    Anticoagulation Management Assessment/Plan:      The patient's current anticoagulation dose is Warfarin sodium 2.5 mg tabs: Use as directed by Anticoagualtion Clinic.  The target INR is 2 - 3.  The next INR is due 02/26/2010.  Anticoagulation instructions were given to patient.  Results were reviewed/authorized by Weston Brass, PharmD.  She was notified by Stephannie Peters, PharmD Candidate .         Prior Anticoagulation Instructions: INR 2.9  Continue same dose of 1/2 tablet every day except 1 tablet on Monday and Friday.  Recheck INR in 4 weeks.   Current Anticoagulation Instructions: INR 2.1    Coumadin 2.5 mg tablets - Continue 1/2 tablet every day except 1 tablet on Mondays and Fridays

## 2010-02-26 ENCOUNTER — Encounter (INDEPENDENT_AMBULATORY_CARE_PROVIDER_SITE_OTHER): Payer: Medicare Other

## 2010-02-26 ENCOUNTER — Encounter: Payer: Self-pay | Admitting: Cardiology

## 2010-02-26 DIAGNOSIS — Z7901 Long term (current) use of anticoagulants: Secondary | ICD-10-CM

## 2010-02-26 DIAGNOSIS — I4891 Unspecified atrial fibrillation: Secondary | ICD-10-CM

## 2010-03-05 DIAGNOSIS — I4891 Unspecified atrial fibrillation: Secondary | ICD-10-CM

## 2010-03-05 DIAGNOSIS — Z7901 Long term (current) use of anticoagulants: Secondary | ICD-10-CM

## 2010-03-08 NOTE — Medication Information (Signed)
Summary: Coumadin Clinic  Anticoagulant Therapy  Managed by: Cloyde Reams, RN, BSN Referring MD: Charlton Haws MD PCP: Marga Melnick MD Supervising MD: Jens Som MD, Arlys John Indication 1: Atrial Fibrillation (ICD-427.31) Lab Used: [facility] Site: [facility] INR POC 2.8 INR RANGE 2 - 3  Dietary changes: yes       Details: Decr intake of vit K foods.   Health status changes: no    Bleeding/hemorrhagic complications: no    Recent/future hospitalizations: no    Any changes in medication regimen? no    Recent/future dental: no  Any missed doses?: no       Is patient compliant with meds? yes       Allergies: 1)  ! Melatonin (Melatonin) 2)  ! Ibuprofen 3)  ! Talwin 4)  ! Haldol 5)  ! Nortriptyline Hcl (Nortriptyline Hcl) 6)  ! Morphine 7)  ! * Antihistamines 8)  ! * Tizandine 9)  ! Dilaudid (Hydromorphone Hcl)  Anticoagulation Management History:      The patient is taking warfarin and comes in today for a routine follow up visit.  Positive risk factors for bleeding include an age of 75 years or older and presence of serious comorbidities.  The bleeding index is 'intermediate risk'.  Positive CHADS2 values include History of HTN and Age > 63 years old.  The start date was 11/03/2002.  Her last INR was 7.0 RATIO.  Anticoagulation responsible provider: Jens Som MD, Arlys John.  INR POC: 2.8.  Cuvette Lot#: [id number].  Exp: 02/2011.    Anticoagulation Management Assessment/Plan:      The patient's current anticoagulation dose is Warfarin sodium 2.5 mg tabs: Use as directed by Anticoagualtion Clinic.  The target INR is 2 - 3.  The next INR is due 03/29/2010.  Anticoagulation instructions were given to patient.  Results were reviewed/authorized by Cloyde Reams, RN, BSN.  She was notified by Cloyde Reams RN.         Prior Anticoagulation Instructions: INR 2.1   Coumadin 2.5 mg tablets - Continue 1/2 tablet every day except 1 tablet on Mondays and Fridays   Current  Anticoagulation Instructions: INR 2.8  Continue on same dosage 1/2 tablet daily except 1 tablet on Mondays and Fridays.  Recheck in 4 weeks.

## 2010-03-29 ENCOUNTER — Encounter (INDEPENDENT_AMBULATORY_CARE_PROVIDER_SITE_OTHER): Payer: Medicare Other

## 2010-03-29 ENCOUNTER — Encounter: Payer: Self-pay | Admitting: Cardiovascular Disease

## 2010-03-29 ENCOUNTER — Encounter: Payer: Self-pay | Admitting: Internal Medicine

## 2010-03-29 ENCOUNTER — Ambulatory Visit (INDEPENDENT_AMBULATORY_CARE_PROVIDER_SITE_OTHER): Payer: Medicare Other | Admitting: Cardiovascular Disease

## 2010-03-29 DIAGNOSIS — Z7901 Long term (current) use of anticoagulants: Secondary | ICD-10-CM

## 2010-03-29 DIAGNOSIS — R609 Edema, unspecified: Secondary | ICD-10-CM

## 2010-03-29 DIAGNOSIS — I4891 Unspecified atrial fibrillation: Secondary | ICD-10-CM

## 2010-03-29 DIAGNOSIS — I10 Essential (primary) hypertension: Secondary | ICD-10-CM

## 2010-04-03 NOTE — Assessment & Plan Note (Signed)
Summary: F.U/CY/JT  Medications Added NEURONTIN 100 MG  CAPS (GABAPENTIN) 2 by mouth qhs LIDODERM 5 % PTCH (LIDOCAINE) use as directed      Allergies Added:   Visit Type:  Follow-up Primary Provider:  Marga Melnick MD   History of Present Illness: Lindsey [name] is seen today for f/U for chronic afib. Despite her age she is a good coumadin candidate.  She drove herself to the office today and walks well with a cane.  She has not had any palpitations, PND or orthopne, TIA or syncope.  She has chronic LE edema from varicosities and this is stable on aldactone.  Current Problems (verified): 1)  Gerd  (ICD-530.81) 2)  Anemia-nos  (ICD-285.9) 3)  Anemia  (ICD-285.9) 4)  Hypertension  (ICD-401.9) 5)  Diverticulosis, Colon  (ICD-562.10) 6)  Vitamin D Deficiency  (ICD-268.9) 7)  Osteoporosis  (ICD-733.00) 8)  Venous Insufficiency, Chronic, Hx of  (ICD-V12.59) 9)  Coumadin Therapy  (ICD-V58.61) 10)  Atrial Fibrillation  (ICD-427.31) 11)  Icd or Lead Infection  (ICD-996.61)  Current Medications (verified): 1)  Spironolactone 25 Mg  Tabs (Spironolactone) .[name].. 1 By Mouth Once Daily 2)  Align   Caps (Misc Intestinal Flora Regulat) .[name].. 1 By Mouth Qd 3)  Diazepam 5 Mg  Tabs (Diazepam) .... 1/2-1 By Mouth Prn 4)  Miralax   Powd (Polyethylene Glycol 3350) .... Prn 5)  Vitamin B-6 .... 1 Tab By Mouth Once Daily 6)  Toprol Xl 25 Mg  Tb24 (Metoprolol Succinate) .[name].. 1 By Mouth Qd 7)  Caltrate 600 1500 Mg Tabs (Calcium Carbonate) .[name].. 1 Tab By Mouth Two Times A Day 8)  Hydrocodone-Acetaminophen 5-325 Mg  Tabs (Hydrocodone-Acetaminophen) .[name].. 1 By Mouth Q4-6h 9)  Neurontin 100 Mg  Caps (Gabapentin) .... 2 By Mouth Qhs 10)  Pantoprazole Sodium 40 Mg Tbec .[name].. 1 By Mouth Once Daily 11)  Premarin 0.625 Mg Tabs (Estrogens Conjugated) .[name].. 1 By Mouth Once Daily 12)  Iron 325 (65 Fe) Mg Tabs (Ferrous Sulfate) .[name].. 1 Tab By Mouth Once Daily 13)  Warfarin Sodium 2.5 Mg Tabs (Warfarin Sodium) .... Use As  Directed By Anticoagualtion Clinic 14)  Vitamin D3 2000 Unit Caps (Cholecalciferol) .[name].. 1 By Mouth Once Daily 15)  Aleve 220 Mg Tabs (Naproxen Sodium) .... As Needed 16)  Aspercreme/aloe 10 % Crea (Trolamine Salicylate) .... As Directed 17)  Vitamin B-12 200 Mcg Tabs (Cyanocobalamin) .[name].. 1 By Mouth Once Daily 18)  Multivitamins  Tabs (Multiple Vitamin) .[name].. 1 By Mouth Once Daily 19)  Maalox .... As Needed 20)  Stool Softner .[name].. 1 By Mouth Once Daily 21)  Lidoderm 5 % Ptch (Lidocaine) .... Use As Directed  Allergies (verified): 1)  ! Melatonin (Melatonin) 2)  ! Ibuprofen 3)  ! Talwin 4)  ! Haldol 5)  ! Nortriptyline Hcl (Nortriptyline Hcl) 6)  ! Morphine 7)  ! * Antihistamines 8)  ! * Tizandine 9)  ! Dilaudid (Hydromorphone Hcl)  Past History:  Past Medical History: Last updated: 02/27/2009 Atrial Fibrillation with bradycardia diverticulitis, PMH of 2008 G E R D Osteoporosis (T score -3.9 @ forearm) Vitamin D deficiency (Vit D 27 in 2010) Diverticulosis, colon 2003 Anemia-NOS, PMH of   Past Surgical History: Last updated: 02/27/2009 Duodenal ulcer surgery 1976 Appendectomy Cholecystectomy Colonoscopy 02/2006 : internal  hemorrhoids Total hip replacement 02/2008, Dr Despina Hick  Family History: Last updated: Mar 14, 2008 Father:decease age 87 emphysema Mother:decease age 54 old age Siblings:1 brother diverticulosis 1 brother s/p CABG  Family History of Cancer: 1  sister breast ca survivor  Social History: Last updated: 03/09/2008 Married  Tobacco Use - No.  Alcohol Use - no Drug Use - no  Review of Systems       Denies fever, malais, weight loss, blurry vision, decreased visual acuity, cough, sputum, SOB, hemoptysis, pleuritic pain, palpitaitons, heartburn, abdominal pain, melena, lower extremity edema, claudication, or rash.   Vital Signs:  Patient profile:   77 year old female Height:      66 inches Weight:      185 pounds BMI:     29.97 Pulse rate:   78  / minute Pulse rhythm:   irregularly irregular BP sitting:   130 / 70  (left arm) Cuff size:   large  Vitals Entered By: Caralee Ates CMA (March 29, 2010 10:42 AM)  Physical Exam  General:  Affect appropriate Healthy:  appears stated age HEENT: normal Neck supple with no adenopathy JVP normal no bruits no thyromegaly Lungs clear with no wheezing and good diaphragmatic motion Heart:  S1/S2 no murmur,rub, gallop or click PMI normal Abdomen: benighn, BS positve, no tenderness, no AAA no bruit.  No HSM or HJR Distal pulses intact with no bruits No edema Neuro non-focal Skin warm and dry    Impression & Recommendations:  Problem # 1:  HYPERTENSION (ICD-401.9) Well controlled Her updated medication list for this problem includes:    Spironolactone 25 Mg Tabs (Spironolactone) .[name]... 1 by mouth once daily    Toprol Xl 25 Mg Tb24 (Metoprolol succinate) .[name]... 1 by mouth qd  Problem # 2:  VENOUS INSUFFICIENCY, CHRONIC, HX OF (ICD-V12.59) Varicose veins.  No ulcer  Continue diuretic Her updated medication list for this problem includes:    Toprol Xl 25 Mg Tb24 (Metoprolol succinate) .[name]... 1 by mouth qd    Warfarin Sodium 2.5 Mg Tabs (Warfarin sodium) ..... Use as directed by anticoagualtion clinic  Problem # 3:  ATRIAL FIBRILLATION (ICD-427.31) Asymptomatic and good rate control Still a reasonable coumadin candidate Her updated medication list for this problem includes:    Toprol Xl 25 Mg Tb24 (Metoprolol succinate) .[name]... 1 by mouth qd    Warfarin Sodium 2.5 Mg Tabs (Warfarin sodium) ..... Use as directed by anticoagualtion clinic  Problem # 4:  COUMADIN THERAPY (ICD-V58.61) INR RX Not falling and no bleeding issues. Continue for now  Patient Instructions: 1)  Your physician wants you to follow-up in: one year  You will receive a reminder letter in the mail two months in advance. If you don't receive a letter, please call our office to schedule the follow-up appointment.

## 2010-04-03 NOTE — Medication Information (Signed)
Summary: rov/ewj  Anticoagulant Therapy  Managed by: Bethena Midget, RN, BSN Referring MD: Charlton Haws MD PCP: Marga Melnick MD Supervising MD: Ladona Ridgel MD, Sharlot Gowda Indication 1: Atrial Fibrillation (ICD-427.31) Lab Used: [facility] Site: [facility] INR POC 2.0 INR RANGE 2 - 3  Dietary changes: no    Health status changes: no    Bleeding/hemorrhagic complications: no    Recent/future hospitalizations: no    Any changes in medication regimen? no    Recent/future dental: no  Any missed doses?: no       Is patient compliant with meds? yes       Allergies: 1)  ! Melatonin (Melatonin) 2)  ! Ibuprofen 3)  ! Talwin 4)  ! Haldol 5)  ! Nortriptyline Hcl (Nortriptyline Hcl) 6)  ! Morphine 7)  ! * Antihistamines 8)  ! * Tizandine 9)  ! Dilaudid (Hydromorphone Hcl)  Anticoagulation Management History:      The patient is taking warfarin and comes in today for a routine follow up visit.  Positive risk factors for bleeding include an age of 40 years or older and presence of serious comorbidities.  The bleeding index is 'intermediate risk'.  Positive CHADS2 values include History of HTN and Age > 61 years old.  The start date was 11/03/2002.  Her last INR was 7.0 RATIO.  Anticoagulation responsible provider: Ladona Ridgel MD, Sharlot Gowda.  INR POC: 2.0.  Cuvette Lot#: [id number].  Exp: 01/2011.    Anticoagulation Management Assessment/Plan:      The patient's current anticoagulation dose is Warfarin sodium 2.5 mg tabs: Use as directed by Anticoagualtion Clinic.  The target INR is 2 - 3.  The next INR is due 04/30/2010.  Anticoagulation instructions were given to patient.  Results were reviewed/authorized by Bethena Midget, RN, BSN.  She was notified by Bethena Midget, RN, BSN.         Prior Anticoagulation Instructions: INR 2.8  Continue on same dosage 1/2 tablet daily except 1 tablet on Mondays and Fridays.  Recheck in 4 weeks.    Current Anticoagulation Instructions: INR 2.0 Continue 1/2 pill  everyday except 1 pill on Mondays and Fridays. Recheck in 4 weeks.

## 2010-04-08 LAB — BASIC METABOLIC PANEL
BUN: 20 mg/dL (ref 6–23)
CO2: 26 mEq/L (ref 19–32)
CO2: 28 mEq/L (ref 19–32)
Chloride: 105 mEq/L (ref 96–112)
Creatinine, Ser: 1.19 mg/dL (ref 0.4–1.2)
GFR calc Af Amer: 52 mL/min — ABNORMAL LOW (ref >60)
Glucose, Bld: 143 mg/dL — ABNORMAL HIGH (ref 70–99)
Potassium: 4.5 mEq/L (ref 3.5–5.1)
Sodium: 136 mEq/L (ref 135–145)

## 2010-04-08 LAB — URINALYSIS, ROUTINE W REFLEX MICROSCOPIC
Nitrite: NEGATIVE
Protein, ur: NEGATIVE mg/dL
Urobilinogen, UA: 1 mg/dL (ref 0.0–1.0)

## 2010-04-08 LAB — CBC
Hemoglobin: 10.4 g/dL — ABNORMAL LOW (ref 12.0–15.0)
MCH: 33.7 pg (ref 26.0–34.0)
MCHC: 34.1 g/dL (ref 30.0–36.0)
MCV: 99.8 fL (ref 78.0–100.0)
Platelets: 198 10*3/uL (ref 150–400)
Platelets: 199 10*3/uL (ref 150–400)
RBC: 3.04 MIL/uL — ABNORMAL LOW (ref 3.87–5.11)
RBC: 3.06 MIL/uL — ABNORMAL LOW (ref 3.87–5.11)

## 2010-04-08 LAB — LIPID PANEL
Cholesterol: 159 mg/dL (ref 0–200)
HDL: 82 mg/dL (ref >39)
LDL Cholesterol: 67 mg/dL (ref 0–99)
Total CHOL/HDL Ratio: 1.9 RATIO

## 2010-04-08 LAB — PROTIME-INR
INR: 2.22 — ABNORMAL HIGH (ref 0.00–1.49)
Prothrombin Time: 24.4 seconds — ABNORMAL HIGH (ref 11.6–15.2)

## 2010-04-08 LAB — DIFFERENTIAL
Eosinophils Absolute: 0.2 10*3/uL (ref 0.0–0.7)
Eosinophils Relative: 2 % (ref 0–5)
Lymphs Abs: 1.2 10*3/uL (ref 0.7–4.0)
Monocytes Relative: 7 % (ref 3–12)

## 2010-04-08 LAB — POCT CARDIAC MARKERS
CKMB, poc: <1 ng/mL — ABNORMAL LOW (ref 1.0–8.0)
Myoglobin, poc: 53.2 ng/mL (ref 12–200)
Troponin i, poc: <0.05 ng/mL (ref 0.00–0.09)

## 2010-04-08 LAB — D-DIMER, QUANTITATIVE: D-Dimer, Quant: <0.22 ug/mL-FEU (ref 0.00–0.48)

## 2010-04-08 LAB — CARDIAC PANEL(CRET KIN+CKTOT+MB+TROPI)
CK, MB: 0.6 ng/mL (ref 0.3–4.0)
Relative Index: INVALID (ref 0.0–2.5)
Total CK: 26 U/L (ref 7–177)
Total CK: 33 U/L (ref 7–177)

## 2010-04-08 LAB — CK TOTAL AND CKMB (NOT AT ARMC): Relative Index: INVALID (ref 0.0–2.5)

## 2010-04-12 ENCOUNTER — Encounter: Payer: Self-pay | Admitting: Cardiovascular Disease

## 2010-04-30 ENCOUNTER — Ambulatory Visit (INDEPENDENT_AMBULATORY_CARE_PROVIDER_SITE_OTHER): Payer: Medicare Other | Admitting: *Deleted

## 2010-04-30 ENCOUNTER — Other Ambulatory Visit: Payer: Self-pay | Admitting: Cardiovascular Disease

## 2010-04-30 DIAGNOSIS — Z7901 Long term (current) use of anticoagulants: Secondary | ICD-10-CM

## 2010-04-30 DIAGNOSIS — I4891 Unspecified atrial fibrillation: Secondary | ICD-10-CM

## 2010-05-03 LAB — CBC
HCT: 29.5 % — ABNORMAL LOW (ref 36.0–46.0)
MCHC: 32.8 g/dL (ref 30.0–36.0)
MCV: 94.4 fL (ref 78.0–100.0)
Platelets: 204 10*3/uL (ref 150–400)

## 2010-05-03 LAB — PROTIME-INR: Prothrombin Time: 20.4 seconds — ABNORMAL HIGH (ref 11.6–15.2)

## 2010-05-08 LAB — TYPE AND SCREEN: Antibody Screen: NEGATIVE

## 2010-05-08 LAB — CBC
HCT: 29 % — ABNORMAL LOW (ref 36.0–46.0)
Hemoglobin: 9.5 g/dL — ABNORMAL LOW (ref 12.0–15.0)
Hemoglobin: 9.9 g/dL — ABNORMAL LOW (ref 12.0–15.0)
MCHC: 32.8 g/dL (ref 30.0–36.0)
MCV: 93 fL (ref 78.0–100.0)
Platelets: 183 10*3/uL (ref 150–400)
Platelets: 265 10*3/uL (ref 150–400)
RBC: 3.16 MIL/uL — ABNORMAL LOW (ref 3.87–5.11)
RBC: 3.17 MIL/uL — ABNORMAL LOW (ref 3.87–5.11)
RDW: 13.4 % (ref 11.5–15.5)
RDW: 14.1 % (ref 11.5–15.5)
WBC: 10.8 10*3/uL — ABNORMAL HIGH (ref 4.0–10.5)
WBC: 4.8 10*3/uL (ref 4.0–10.5)

## 2010-05-08 LAB — BASIC METABOLIC PANEL
Calcium: 7.9 mg/dL — ABNORMAL LOW (ref 8.4–10.5)
Chloride: 104 mEq/L (ref 96–112)
Creatinine, Ser: 0.85 mg/dL (ref 0.4–1.2)
Creatinine, Ser: 0.89 mg/dL (ref 0.4–1.2)
GFR calc Af Amer: >60 mL/min (ref >60)
GFR calc Af Amer: >60 mL/min (ref >60)
GFR calc non Af Amer: >60 mL/min (ref >60)
GFR calc non Af Amer: >60 mL/min (ref >60)
Potassium: 3.9 mEq/L (ref 3.5–5.1)
Sodium: 133 mEq/L — ABNORMAL LOW (ref 135–145)

## 2010-05-08 LAB — PREPARE RBC (CROSSMATCH)

## 2010-05-08 LAB — COMPREHENSIVE METABOLIC PANEL
Albumin: 3.5 g/dL (ref 3.5–5.2)
Alkaline Phosphatase: 81 U/L (ref 39–117)
BUN: 15 mg/dL (ref 6–23)
Chloride: 103 mEq/L (ref 96–112)
Potassium: 4.4 mEq/L (ref 3.5–5.1)
Total Bilirubin: 0.9 mg/dL (ref 0.3–1.2)

## 2010-05-08 LAB — PROTIME-INR
INR: 1.1 (ref 0.00–1.49)
INR: 1.2 (ref 0.00–1.49)
INR: 2.6 — ABNORMAL HIGH (ref 0.00–1.49)
Prothrombin Time: 14.9 seconds (ref 11.6–15.2)
Prothrombin Time: 15.8 seconds — ABNORMAL HIGH (ref 11.6–15.2)
Prothrombin Time: 19.7 seconds — ABNORMAL HIGH (ref 11.6–15.2)

## 2010-05-08 LAB — URINALYSIS, ROUTINE W REFLEX MICROSCOPIC
Glucose, UA: NEGATIVE mg/dL
Hgb urine dipstick: NEGATIVE
Protein, ur: NEGATIVE mg/dL
Specific Gravity, Urine: 1.005 (ref 1.005–1.030)
pH: 5.5 (ref 5.0–8.0)

## 2010-05-08 LAB — URINE MICROSCOPIC-ADD ON

## 2010-05-28 ENCOUNTER — Ambulatory Visit (INDEPENDENT_AMBULATORY_CARE_PROVIDER_SITE_OTHER): Payer: Medicare Other | Admitting: *Deleted

## 2010-05-28 DIAGNOSIS — I4891 Unspecified atrial fibrillation: Secondary | ICD-10-CM

## 2010-05-28 DIAGNOSIS — Z7901 Long term (current) use of anticoagulants: Secondary | ICD-10-CM

## 2010-05-28 LAB — POCT INR: INR: 2.2

## 2010-06-05 NOTE — Assessment & Plan Note (Signed)
[facility]                            CARDIOLOGY OFFICE NOTE   NAME:Pinheiro, DELCENIA INMAN              MRN:          [id number]  DATE:12/07/2007                            DOB:          04-May-1923    Ms. Elena returns today in followup.  She has chronic atrial  fibrillation and lower extremity edema.  She has been doing fairly well.  Her diverticulitis has subsided.  She was quite ill with it around May  and June.  She is not having any abdominal pain.   She sees Coumadin Clinic regularly.  On her last Coumadin visit, she did  get a flu shot.  Her INRs have been therapeutic.  Rate control has been  good.  She actually tends to have a bit of sick sinus syndrome in  regards to bradycardia.  She was only beating in the low 50s today at  rest and I told her to cut her Toprol in half to 12.5 mg a day.  She has  not had any presyncope or signs of long pauses.   REVIEW OF SYSTEMS:  Otherwise remarkable for increasing right hip pain.  She sees Dr. Sherlean Foot for this.  Review of systems otherwise negative.   1. She is on Coumadin 2.5 mg a day.  2. Protonix 40 a day.  3. Actonel weekly.  4. Ambien as needed.  5. Neurontin 100 a day.  6. Spironolactone 25 a day.  7. Diazepam.  8. Premarin.  9. Caltrate.  10.Toprol to be decreased to 12.5 a day.   PHYSICAL EXAMINATION:  GENERAL:  Remarkable for chronically ill-  appearing elderly white female in no distress.  VITAL SIGNS:  Her weight is 153, blood pressure 118/77, pulse 51-58 and  irregular, respiratory rate 14, afebrile.  HEENT:  Unremarkable.  NECK:  Carotids are normal without bruit.  No lymphadenopathy,  thyromegaly, or JVP elevation.  LUNGS:  Clear.  Good diaphragmatic motion.  No wheezing.  CARDIAC:  S1, S2 with soft systolic murmur.  PMI normal.  ABDOMEN:  Benign.  Bowel sounds positive.  No AAA.  No tenderness.  No  bruit.  No hepatosplenomegaly or hepatojugular reflux.  No tenderness.  EXTREMITIES:  Distal pulses are intact.  No edema.  NEUROLOGIC:  Nonfocal.  SKIN:  Warm and dry.  MUSCULOSKELETAL:  No muscular weakness.   IMPRESSION:  1. Chronic atrial fibrillation with tendency towards bradycardia.      Decrease Toprol to 12.5 a day.  Continue Coumadin.  Follow up in      Anticoagulation Clinic.  2. Lower extremity edema, improved.  Continue current dose of      spironolactone.  3. Right hip pain.  Follow up with Dr. Sherlean Foot.  She would be a      candidate for surgery if needed.  4. Recent diverticulitis, resolved.  Continue high-fiber diet.  Follow      up with primary care MD.  I will see her back in 6 months.     Noralyn Pick. Eden Emms, MD, [facility]  Electronically Signed    PCN/MedQ  DD: 12/07/2007  DT: 12/08/2007  Job #: [id number]

## 2010-06-05 NOTE — Op Note (Signed)
Lindsey [name]               ACCOUNT NO.:  [id number]   MEDICAL RECORD NO.:  [id number]          PATIENT TYPE:  INP   LOCATION:  0003                         FACILITY:  [facility]   PHYSICIAN:  Ollen Gross, M.D.    DATE OF BIRTH:  [birth year]   DATE OF PROCEDURE:  03/18/2008  DATE OF DISCHARGE:                               OPERATIVE REPORT   PREOPERATIVE DIAGNOSIS:  Osteoarthritis, right hip.   POSTOPERATIVE DIAGNOSIS:  Osteoarthritis, right hip.   PROCEDURE:  Right total hip arthroplasty.   SURGEON:  Ollen Gross, M.D.   ASSISTANT:  Avel Peace, PA-C   ANESTHESIA:  General.   ESTIMATED BLOOD LOSS:  500.   DRAINS:  Hemovac x1.   COMPLICATIONS:  None.   CONDITION:  Stable to recovery.   BRIEF CLINICAL NOTE:  Lindsey [name] is an 77 year old female who has end-  stage arthritis of the right hip with progressively worsening pain and  dysfunction.  She has failed nonoperative management and presents now  for total hip arthroplasty.   PROCEDURE IN DETAIL:  After the successful administration of general  anesthetic, the patient was placed in the left lateral decubitus  position with the right side up and held with the hip positioner.  Right  lower extremity was isolated from her perineum with plastic drapes and  prepped and draped in the usual sterile fashion.  Short posterolateral  incision is made with a 10-blade through subcutaneous tissue to the  level of the fascia lata which was incised in line with the skin  incision.  Sciatic nerve was palpated and protected and the short  external rotators isolated off the femur.  Capsulectomy was performed  and the hip is dislocated.  The center of the femoral head is marked and  a trial prosthesis placed, such that the center of the trial head  corresponds to the center of her native femoral head.  Osteotomy lines  marked on the femoral neck and osteotomy made with an oscillating saw.  Femoral head is removed and the femur is  retracted anteriorly to gain  acetabular exposure.   Acetabular retractors were placed and labrum and osteophytes removed.  The acetabular reaming starts at 45, coursing increments of 2-53 mm, and  then a 54-mm Pinnacle acetabular shell is placed in anatomic position  with excellent purchase.  We did not need any provisional screw  fixation.  The trial 36-mm neutral +4 liner was placed.   The femur was prepared with canal finder and irrigation.  Axial reaming  is performed up to 15.5 mm, proximal reaming to 22F, and the sleeve  machine to a small.  The 22F small trial sleeve is placed with 20 x 15  stem, 36+8 neck, matching native anteversion.  The trial 36+0 head is  placed and the hip is reduced with outstanding stability.  There was  full extension, full external rotation, 70 degrees flexion, 40 degrees  adduction, 90 degrees internal rotation and 90 degrees of flexion, 70  degrees of internal rotation.  By placing the right leg on top of the  left, it felt as though  the leg lengths were equal.  The hip was then  dislocated and the trials are removed.  The permanent apex hole  eliminator was placed in the acetabular shell and then permanent 36-mm  neutral +4 Marathon liner placed.  On the femoral side I placed a  permanent 54F small sleeve and a 20 x 15 stem, 36+8 neck, matching  native anteversion.  The 36+0 head is placed and the hip is reduced with  the same stability parameters.  The wound was copiously irrigated with  saline solution and short rotators reattached to the femur through drill  holes.  Fascia lata was closed over a Hemovac drain with interrupted #1  Vicryl, subcu closed with #1 and #2-0 Vicryl, and subcuticular running 4-  0 Monocryl.  Incision was cleaned and dried and Steri-Strips applied.  The drain was hooked to suction and bulky sterile dressing applied.  She  was placed into a knee immobilizer, awakened, and transferred to  recovery in stable  condition.      Ollen Gross, M.D.  Electronically Signed     FA/MEDQ  D:  03/18/2008  T:  03/18/2008  Job:  [id number]

## 2010-06-05 NOTE — Op Note (Signed)
[name], Lindsey [name]               ACCOUNT NO.:  [id number]   MEDICAL RECORD NO.:  [id number]          PATIENT TYPE:  AMB   LOCATION:  SDS                          FACILITY:  [facility]   PHYSICIAN:  Lindsey Lindsey [name], M.D.DATE OF BIRTH:  1923/12/14   DATE OF PROCEDURE:  12/16/2006  DATE OF DISCHARGE:                               OPERATIVE REPORT   PREPARATION DIAGNOSIS:  Right inguinal hernia.   POSTOPERATIVE DIAGNOSIS:  Right inguinal hernia.   PROCEDURE:  Right inguinal hernia with mesh.   SURGEON:  Lindsey Lindsey [name], M.D.   ANESTHESIA:  General by way of LMA and 0.5% Marcaine local.   INDICATIONS:  Lindsey Lindsey [name] is an 77 year old female who has been having  intermittent right groin discomfort and a right inguinal hernia repair  containing right inguinal hernia containing some fatty tissue.  Because  of her symptomatic hernia, she now presents for repair.  We have  discussed the procedure, the risks and the aftercare preoperatively.   TECHNIQUE:  She was seen in the holding area and the right groin marked  with my initials.  She was then brought to the operating room, placed  supine on the operating table and general anesthetic was administered.  The right groin area was sterilely prepped and draped.  Marcaine  solution was infiltrated superficially and deep in the right groin.  A  right groin incision was made incising the skin, subcutaneous tissue and  Scarpa fascia.  External oblique aponeurosis was identified and local  anesthetic was infiltrated deep to it.  I then incised at external  oblique aponeurosis medially through the internal ring and laterally up  toward the anterior superior iliac spine.  Using blunt dissection, the  internal oblique aponeurosis was identified superiorly and the shelving  edge of the inguinal then identified inferiorly.  The ilioinguinal nerve  was identified and retracted inferiorly.  The round ligament was noted  and isolated and the hernia  sac containing some extraperitoneal fat was  adherent to it.  I dissected out the right ligament and then divided it  close to the pubic tubercle medially and close to the internal ring  laterally and then ligated the remaining portions.  I then reduced the  extraperitoneal fatty contents and sac through the patulous internal  ring and then loosely approximated the internal ring by approximating  the weakened transversalis fascia using interrupted 2-0 Vicryl sutures.   A piece of 3 x 6 inch polypropylene mesh was brought into the field and  anchored 2 cm medial to the pubic tubercle.  The inferior aspect of the  mesh was anchored to the shelving edge of the inguinal ligament with a  running 2-0 Prolene suture.  The superior aspect of the mesh was  anchored to the internal oblique aponeurosis with interrupted 2-0 Vicryl  sutures.  The lateral aspect of the mesh was then tucked deep to the  external oblique aponeurosis.   At this point I examined the area.  The ilioinguinal nerve and  iliohypogastric nerve were both identified and preserved.  I then closed  the external oblique aponeurosis over  the mesh with running 3-0 Vicryl  suture.  Scarpa fascia was closed with running 2-0 Vicryl suture.  The  skin was closed with a 4-0 Monocryl subcuticular stitch followed by  Steri-Strips and sterile dressing.   She tolerated the procedure well without any apparent complications and  was taken to recovery in satisfactory condition.      Lindsey Lindsey [name], M.D.  Electronically Signed     TJR/MEDQ  D:  12/16/2006  T:  12/16/2006  Job:  [id number]   cc:   Lindsey Dubin. Alwyn Ren, MD,FACP,FCCP  Lindsey Lindsey [name], M.D.  Lindsey Pick. Eden Emms, MD, [facility]

## 2010-06-05 NOTE — Assessment & Plan Note (Signed)
[facility]                            CARDIOLOGY OFFICE NOTE   NAME:Lindsey [name], Lindsey [name]              MRN:          [id number]  DATE:05/30/2006                            DOB:          08/17/1923    Lindsey [name] is an 77 year old patient I follow for atrial fibrillation.  She also has some chronic lower extremity edema.  The patient has been  somewhat ill lately.  She has had significant diverticulitis requiring 2  to 3 courses of antibiotics and also some medicine to help overgrowth of  bacteria in the gut.  As expected, this has caused her Coumadin levels  to fluctuate.  She is getting checked more frequently in the clinic.   Despite her age, she continues to be fairly healthy and a good candidate  for Coumadin.  She has chronic atrial fibrillation.  She has not had any  significant palpitations, PND, or orthopnea.  There has been no syncope.   Her rates have been fine.  She has some mild chronic lower extremity  edema.  Her review of systems is otherwise remarkable for some reflux-  type pain that she takes Tums for.  As indicated, her biggest problem  over the last 5 months has been her stomach.  Her GI doctor is Dr.  Jarold Motto.   REVIEW OF SYSTEMS:  Otherwise, negative.   MEDICATIONS:  1. Coumadin 2.5 mg a day.  2. Protonix 40 a day.  3. Actonel weekly.  4. Ambien as needed.  5. Neurontin 100 mg a day.  6. Spironolactone 25 a day.  7. Diazepam p.r.n.  8. Premarin 0.625 a day.  9. Caltrate 600 a day.  10.Toprol 25 a day.   EXAM:  Remarkable for an elderly-appearing white female in no distress.  Mood is appropriate, although somewhat depressed by her recent illness.  BP 140/80 pulse 83 regular Afebrile RR 16  HEENT:  Normal.  There are no carotid bruits.  No thyromegaly.  No lymphadenopathy.  No  JVP elevation.  LUNGS:  Clear with no wheezes and good diaphragmatic motion.  There is  an S1, S2 with a systolic ejection murmur.  PMI is mildly increased, but not displaced.  ABDOMEN:  Benign.  Bowel sounds are positive.  There is no tenderness.  There is no hepatosplenomegaly.  No hepatojugular reflux.  No masses.  No organomegaly.  Femoral pulses are +2 bilaterally with no bruits.  PT palpable  bilaterally.  There is +1 to 2 edema bilaterally in the lower  extremities with nonfocal neuro exam and good muscle strength  throughout.   Her EKG today shows atrial fibrillation.  Is otherwise normal.   IMPRESSION:  Stable chronic atrial fibrillation with good rate control  with just Toprol 25 a day.  Continue to follow Coumadin closely as her  gastrointestinal tract has had problems with diverticulitis, frequent  antibiotics, and medications to change bacterial overgrowth.   She is not having significant chest pain.  Her lower extremity edema is  chronic and mild.  She will continue her spironolactone.   There is no need for further testing at this time.  I will see her  back  in 6 months.     Noralyn Pick. Eden Emms, MD, [facility]  Electronically Signed    PCN/MedQ  DD: 05/30/2006  DT: 05/30/2006  Job #: [id number]

## 2010-06-05 NOTE — Discharge Summary (Signed)
Lindsey [name], Lindsey [name]               ACCOUNT NO.:  [id number]   MEDICAL RECORD NO.:  [id number]          PATIENT TYPE:  INP   LOCATION:  1617                         FACILITY:  [facility]   PHYSICIAN:  Ollen Gross, M.D.    DATE OF BIRTH:  [birth year]   DATE OF ADMISSION:  03/18/2008  DATE OF DISCHARGE:  03/22/2008                               DISCHARGE SUMMARY   ADMITTING DIAGNOSES:  1. Osteoarthritis right hip.  2. Impaired hearing.  3. Cataracts.  4. Hypertension.  5. Reflux disease.  6. Chronic pain.  7. History of atrial fibrillation.  8. History of peptic ulcer disease.  9. Diverticulosis.  10.Hemorrhoids.  11.Anemia.  12.Osteoporosis.  13.Postmenopausal.  14.Cervical arthritis.  15.Childhood illnesses of measles, mumps and rubella.   DISCHARGE DIAGNOSES:  1. Osteoarthritis right hip status post right total hip replacement      arthroplasty.  2. Mild postoperative blood loss anemia.  3. Status post transfusion without sequelae.  4. Mild postoperative hyponatremia, improved.   Remaining discharge diagnoses same as admitting diagnoses.   PROCEDURE:  March 18, 2008 right total hip, surgeon Dr. Lequita Halt,  assistant Avel Peace, PA-C, surgery done under general anesthesia.   CONSULTS:  None.   BRIEF HISTORY:  Lindsey [name] is an 77 year old female who has been  followed by Dr. Lequita Halt for ongoing right hip pain.  She has known  arthritis and it has been refractory to conservative measures, now  presents for total hip arthroplasty.   LABORATORY DATA:  Preoperative CBC showed low hemoglobin at 9.5 with a  crit of 29, no history of anemia, white cell count 4.8, platelets 265.  She was on chronic Coumadin and the Coumadin level at the time of  preoperative:  She had INR of 2.6 with PT of 29.2 and a PTT of 44.  Her  INR was repeated on the day of surgery and it was 1.1 after stopping  Coumadin 5 days preoperatively.  Chem panel on admission:  Minimally  elevated  creatinine of 1.25, glucose mildly elevated at 112, remaining  Chem panel within normal limits.  Preoperative UA did show some trace  leukocyte esterase with only zero to two white cells, otherwise  negative.  Serial CBCs were followed throughout the hospital course,  hemoglobin did drop down to a level of 8.6.  We rechecked her hemoglobin  on the day of surgery, it was 8.6.  She was given 2 units of blood, post  transfusion hemoglobin came back up to 9.9 and 9.7, were stabilized.  Last known H and H was 9.7 and 29.5.  Serial ProTimes followed per  Coumadin protocol, last known PT/INR 25.2 and 2.1.  Serial BMETs were  followed, sodium did drop from 137  to 133, came back up to 135,  creatinine came down to a normal level of 0.8.   X-RAYS:  1. Chest x-ray:  Preoperative mild cardiac enlargement, no active      disease.  2. Right hip film:  Preoperative advanced degenerative changes right      hips, questionable underlying AVN.  This was dated March 14, 2008.  3. Postoperative hip and pelvis films shows right total hip      replacement without complication.   EKG dated December 07, 2007:  Atrial fibrillation with a slow  ventricular response, confirmed by Dr. Eden Emms.   HOSPITAL COURSE:  The patient was admitted to [facility],  taken to the OR, underwent above-stated procedure without complication.  The patient tolerated procedure well.  She was also given 2 units of  blood postoperatively due to her preoperative hemoglobin being low and  also the postoperative hemoglobin.  She did tolerate the blood well.  She was later transferred to the recovery room and then to the  orthopedic floor.  She had a rough night, did not get much sleep and had  a fair amount of pain associated with the surgery.  She had a Hemovac  drain placed during the surgery that was pulled.  She was given 24 hours  postoperative IV antibiotics.  She was also placed back on her Coumadin.  She had a  Lovenox bridge because she was on chronic Coumadin for her  atrial fibrillation.  Sodium was a little low on the postoperative day  one.  We rechecked her fluids, she was placed back on her heart  regulation medications and her blood pressure pills were placed on  parameters.  We resumed her Protonix.  She has known cervical neck  arthritis and she had some pain and discomfort with that so we gave her  a K-pad for her neck, a heating pad to help out with pain and  discomfort.  She was started on p.o. medications and also given IV PCA  for day.  She was weaned over to p.o. medications fully by postoperative  day two.  She continued on a Lovenox bridge until her Coumadin was  therapeutic.  The sodium which was low on postoperative day one had  already come back up and back to normal level on postoperative day two.  She only had slow progression requiring moderate assist getting up out  of bed and short ambulation.  By Sunday, however, she was up walking  short distances, about 20 feet.  It was felt that she would require some  skilled facility.  We got discharge planning.  They got involved and  started that postoperatively.  Postoperative day three her hemoglobin  remained stable at 9.7.  Incision was healing well, dressings changes  started on day two and day three and four incision was healing well, no  signs of infection.  She was seen on rounds on postoperative day three,  felt that she was stable, started to get up and walk short distances.  We got social work involved to help out with placement.  She was seen on  the morning of day four of March 22, 2008 by Dr. Lequita Halt.  She was  feeling a little bit better other than having some night sweats being  off her Premarin.  She was on chronic Coumadin with Premarin on board,  she is already therapeutic on her INR.  Her INR was at this point up to  2.1 so we will resume her Premarin.  We were waiting on a bed, they were  looking into  [facility] and some other facilities.  Once bed became  available decided she would be transferred over at that time.   DISCHARGE PLAN:  Tentative date of discharge, today's March 22, 2008.   DISCHARGE DIAGNOSES:  Please see above.   DISCHARGE MEDICATIONS:  1.  We will resume her Premarin 0.625 daily a.m.  2. Colace 100 mg p.o. b.i.d.  3. Coumadin protocol, she will be on the postoperative Coumadin      protocol for target INR between 2 and 3 for a total of 3 weeks from      the date of surgery, March 18, 2008.  After that she may resume      her previous home dosing.  Previous home dosing was Coumadin 2.5 mg      on Monday and Friday and then she was taking a 1/2 tablet of 2.5 mg      on Tuesday, Wednesday, Thursday, Saturday and Sunday.  4. Protonix 40 mg p.o. daily.  5. Neurontin 100 to 200 mg p.o. nightly.  6. Spironolactone 25 mg p.o. daily.  7. Toprol XL 25 mg daily.  8. Lidoderm pain patch.  She places it on for 12 hours and then      removes it for 12 hours, can apply up to three patches at that time      which is a Lidoderm 5% patch.  9. MiraLAX 17 grams in 8 ounces of water or juice p.o. daily, hold if      loose stool or diarrhea.  10.Nu-Iron 150 mg p.o. daily.  11.Vicodin 5 mg one or two every 4 - 6 hours as needed for pain.  12.Tylenol 325 one or two every 4 - 6 hours as needed for mild pain,      temperature or headache.  13.Valium 2.5 mg q.8 hours p.r.n. spasm.  14.Laxative of choice.  15.Enema of choice.   DIET:  Diet as tolerated.   ACTIVITY:  She is partial weightbearing to the right lower extremity,  25% - 50% partial weightbearing.  Hip precautions and total hip  protocol.  PT and OT for gait training, ambulation, ADLs, hip  precautions.  She may start showering however do not submerge the  incision under water.  She needs daily dressing changes of the right hip  incision.   DISPOSITION:  Pending at this time, waiting on bed availability.   CONDITION  ON DISCHARGE:  Slowly improving.      [name], P.A.C.      Ollen Gross, M.D.  Electronically Signed    ALP/MEDQ  D:  03/22/2008  T:  03/22/2008  Job:  [id number]   cc:   Ollen Gross, M.D.  Fax: [phone]   Noralyn Pick. Eden Emms, MD, [facility]  [address]  [address]  [place]  [place] [address]   Titus Dubin. Alwyn Ren, MD,FACP,FCCP  [phone] [place]  [place] [address]   Malachi Pro. Ambrose Mantle, M.D.  Fax: [phone]   Skilled Nursing Facility

## 2010-06-05 NOTE — H&P (Signed)
Lindsey [name], Lindsey [name]          ACCOUNT NO.:  [id number]   MEDICAL RECORD NO.:  [id number]          PATIENT TYPE:  INP   LOCATION:                               FACILITY:  [facility]   PHYSICIAN:  Ollen Gross, M.D.    DATE OF BIRTH:  03/28/1923   DATE OF ADMISSION:  03/18/2008  DATE OF DISCHARGE:                              HISTORY & PHYSICAL   CHIEF COMPLAINT:  Right hip pain.   HISTORY OF PRESENT ILLNESS:  The patient is an 77 year old female who  has been seen by Dr. Lequita Halt for ongoing right hip pain.  She was seen  as second opinion, noted to have worsening pain over quite some time  now.  She has been under pain management, referred over for hip  evaluation.  She has been seen by Dr. Lequita Halt and found to have end-  stage arthritis, felt to be a good candidate for surgery.  She has been  seen preoperative by Dr. Eden Emms and recommended stop Coumadin 5 days  preoperatively and felt to be stable for surgery.   ALLERGIES:  Multiple allergies including Talwin, Haldol, ibuprofen,  melatonin, morphine which causes nausea and vomiting, generic Zanaflex  and multiple muscle relaxants.   CURRENT MEDICATIONS:  Coumadin varying dosages, Protonix 40 mg, Actonel,  Ambien CR, Neurontin, Vicodin, spironolactone, diazepam, Premarin,  Caltrate, align, MiraLax, Aleve, Lidoderm patches, Aspercreme, vitamin  D, vitamin B6.   PAST MEDICAL HISTORY:  1. Impaired hearing.  2. Cataracts.  3. Hypertension.  4. Reflux disease.  5. Chronic pain.  6. Atrial fibrillation.  7. History of peptic ulcer disease.  8. Diverticulosis.  9. Hemorrhoids.  10.Anemia.  11.Osteoporosis.  12.Postmenopausal.  13.Cervical arthritis.  14.Childhood illnesses of measles, mumps, Rubella.   PAST SURGICAL HISTORY:  1. Tonsillectomy.  2. Duodenal surgery.  3. Hysterectomy.  4. Gallbladder surgery.  5. Left cataract surgery.  6. Benign lumpectomy in the breast.  7. Hernia repair.   FAMILY HISTORY:  Father  deceased age 64 with emphysema and bleeding.  Mother deceased age 74.  Complications due to a wreck.  Has a sister  died age two and a [age] with diphtheria.   SOCIAL HISTORY:  Widowed, nonsmoker.  No alcohol.  Lives alone.  Two  children.  She would like to look into a skilled nursing facility,  possibly [facility].  She does have a living will healthcare power of  attorney.   REVIEW OF SYSTEMS:  GENERAL:  No fevers, chills, night sweats.  NEUROLOGICAL:  A little bit hearing loss.  No seizures, syncope or  paralysis.  RESPIRATORY:  Little bit shortness of breath on exertion,  but no shortness of breath at rest.  No productive cough or hemoptysis.  CARDIOVASCULAR: No chest pain, angina or orthopnea.  GI: No nausea,  vomiting, diarrhea or constipation.  GU:  No dysuria, hematuria  discharge.  MUSCULOSKELETAL:  Hip pain.   PHYSICAL EXAMINATION:  VITAL SIGNS:  Pulse 56, respirations 12, blood  pressure 148/72.  GENERAL:  An 77 year old white female well-nourished, well-developed,  slightly kyphotic.  She comes by her family.  HEENT:  Normocephalic, atraumatic.  Pupils round  and reactive.  EOMs  intact.  NECK:  Supple.  CHEST: Clear.  HEART:  She has somewhat of an irregular rhythm, known atrial fib.  Slight murmur, grade 2/6 systolic best heard over aortic point.  ABDOMEN:  Soft, nontender.  Bowel sounds present.  RECTAL/BREASTS/GENITALIA:  Not done, not pertinent to present illness.  EXTREMITIES:  Right hip flexion 90-0, internal rotation 0, external  rotation at 20 degrees abduction.   IMPRESSION:  Osteoarthritis right hip.   PLAN:  The patient was admitted to [facility] to undergo a  right total hip replacement arthroplasty.  Surgery will be performed by  Ollen Gross.  Dr. Eden Emms is her cardiologist to be notified of the  admission and be consulted if needed for new medical assistance  with  the patient throughout the hospital course.      Alexzandrew L.  Perkins, P.A.C.      Ollen Gross, M.D.  Electronically Signed    ALP/MEDQ  D:  03/17/2008  T:  03/17/2008  Job:  [id number]   cc:   Ollen Gross, M.D.  Fax: [phone]   Noralyn Pick. Eden Emms, MD, [facility]  [address]  [address]  [place]  [place] [address]   Titus Dubin. Alwyn Ren, MD,FACP,FCCP  [phone] [place]  [place] [address]   Malachi Pro. Ambrose Mantle, M.D.  Fax: [phone]

## 2010-06-05 NOTE — Discharge Summary (Signed)
NAMEMarland Kitchen  Lindsey [name], MCCLEAVE               ACCOUNT NO.:  [id number]   MEDICAL RECORD NO.:  [id number]          PATIENT TYPE:  INP   LOCATION:  1617                         FACILITY:  [facility]   PHYSICIAN:  Ollen Gross, M.D.    DATE OF BIRTH:  April 12, 1923   DATE OF ADMISSION:  03/18/2008  DATE OF DISCHARGE:                               DISCHARGE SUMMARY   ADDENDUM:   FOLLOW-UP PLAN:  The patient will need to follow up with Dr. Ollen Gross at the [facility],  please contact the office at [phone] to help arrange appointment and  follow-up of this patient approximately 2 weeks from the date of  surgery.      [name], P.A.C.      Ollen Gross, M.D.  Electronically Signed    ALP/MEDQ  D:  03/22/2008  T:  03/22/2008  Job:  [id number]

## 2010-06-08 NOTE — H&P (Signed)
NAMEMarland [name]  ADY, HEIMANN               ACCOUNT NO.:  [id number]   MEDICAL RECORD NO.:  [id number]          PATIENT TYPE:  INP   LOCATION:  6707                         FACILITY:  [facility]   PHYSICIAN:  Iva Boop, MD,FACGDATE OF BIRTH:  1923-01-25   DATE OF ADMISSION:  03/14/2006  DATE OF DISCHARGE:                              HISTORY & PHYSICAL   CHIEF COMPLAINT:  Bleeding from the rectum that began this morning.   HISTORY OF PRESENT ILLNESS:  Lindsey [name] is a pleasant 77 year old  white female.  She has a history of constipation-prone IBS.  She has a  remote history around 1970 of ulcer disease which required a vagotomy  and gastrectomy with Billroth II anastomosis.  She has chronic atrial  fibrillation for which she takes Coumadin.   About 5 weeks ago the patient was treated with to a two-week course of  Cipro and Flagyl by an urgent care physician.  She had presented with a  left upper quadrant pain, and this was presumed secondary to  diverticulitis.  She had a bout of this about a year previously that was  attributed to diverticulitis.  Following completion of the antibiotic  therapy, Dr. Sheryn Bison obtained a CT scan of the abdomen and  pelvis; that was on January 28.  This showed tapering of the colon at  the left splenic flexure but no obvious masses.  The study did not show  substantial diverticulosis or any diverticulitis.  There were several  liver lesions described as too small to characterize.  There was a left  kidney cyst.  Stool was noted in the right and transverse colon. and a  right diaphragmatic hernia was noted.   The patient saw Dr. Jarold Motto on January 31 for an office visit.  He  diagnosed her problems, which at that point were lingering left upper  quadrant pain, as subacute diverticulitis.  He did not prescribe any  further antibiotics.  He did send her home with Hemoccult cards.  Three  out of six of these were positive for fecal occult blood.   She was set  up for colonoscopy endoscopy and February 25 on Coumadin.   The patient's last colonoscopy and endoscopy were performed in 2003.  On  the colonoscopy, she had diverticulosis.  On the upper endoscopy, she  had chronic anastomotic region ulcer.  The patient does take daily  Protonix.  She states that she has nausea with some frequency but no  emesis.  She also tends to be constipation prone, though lately since  she had the diverticulitis, she has been having daily pebble-like bowel  movements which is actually an improvement for her.  Just about every  time the patient has a bowel movement, she does see a scant amount of  blood per rectum.   This morning about 8:30 she passed a stool which was brown but also  mixed with a moderately large volume of blood.  This filled up the  commode with wine-colored blood.  The patient did not have any worsening  of her abdominal pain with this.  She did not feel  weak or have any  syncopal symptoms.  She does say that she has been having some  generalized weakness for the past 4 days.  Again at about 1:30 today,  she had a second episode of hematochezia.  This time the stool was  darker, so she contacted Dr. Norval Gable office and was sent to the  emergency room where she is now being evaluated, and plans are to admit  her for further care and observation.  The patient does not use any  aspirin or nonsteroidal medications.  She does take hydrocodone five  times daily along with numerous other medications outlined in a list  below.   ALLERGIES:  To the ZANAFLEX and to TALWIN.  Both of these cause nausea  and vomiting.  She is allergic to IBUPROFEN; the reaction is unknown.  She is also allergic to MORPHINE, reaction unknown.  Allergic to AVINZA,  reaction unknown, and allergic to MUSCLE RELAXANT. Question if this  might be the Zanaflex.  Some of these reactions are not known, and  possibly she just had adverse reactions rather than true  allergy.   MEDICATIONS:  1. Coumadin.  She takes 2.5 mg on Tuesdays, and the other days of the      week she takes 1.25 mg.  2. Viactiv 2 p.o. daily.  3. Vitamin D, dose unknown, one p.o. daily.  4. Hydrocodone/APAP 10/325 mg five times daily.  5. Protonix 40 mg once daily.  6. Spirolactone 25 mg one p.o. daily.  7. Toprol XL 25 mg one p.o. daily.  8. Actonel 35 mg weekly.  9. Diazepam 5 mg 1/2 p.o. p.r.n. Does not use this very frequently.  10.Multivitamin once daily.  11.Ambien CR 6.25 mg daily at bedtime.  12.Premarin 0.65 mg p.o. daily.  13.Caltrate 600 mg once daily.  14.Neurontin 100 mg once daily.  15.MiraLax p.r.n.  16.Lidoderm patch p.r.n.  17.Phenergan 6.25 mg twice daily as needed.   PAST MEDICAL HISTORY:  1. Vasovagal syncope associated with neck pain in June 2003.  2. Cardiomegaly.  3. Osteoporosis.  4. Degenerative spine disease.  She has never had spinal surgery.  5. Degenerative disk disease.  6. Irritable bowel syndrome with functional constipation owing to      chronic narcotics.  7. History of diverticulosis and diverticulitis.  8. Hypertension.  9. Anxiety.  10.Gastroesophageal reflux disease.  11.She has been H.  Pylori negative in the past.  12.History of peptic ulcer disease status post vagotomy, gastrectomy      and Billroth II anastomosis around 1970.  13.Chronic atrial fibrillation for which she takes chronic Coumadin.      She had a remote cardiac catheterization with normal vessels.  14.Chronic musculoskeletal pain, especially of the spine for which she      uses chronic narcotics.  15.History of periodic treatment of a GI symptoms with antibiotics for      presumed bacterial overgrowth.  16.Status post cholecystectomy, appendectomy, total abdominal      hysterectomy.  Status post breast biopsy August 2007. Biopsy      showing fibrosis and no cancer and no atypia.  SOCIAL HISTORY:  She lives in [place] with her husband.  She does not   smoke.  She does not consume tobacco products.   FAMILY HISTORY:  One brother had diverticulosis, another is status post  CABG. One sister is a breast cancer survivor.  Father died at age 78  from emphysema.  He had a history of bleeding ulcers. Her mother died  at  72 years old with what sounds like age-related  general decline.   REVIEW OF SYSTEMS:  GU: No incontinence.  No blood in the urine.  No  dysuria, no frequency. GI: No dysphagia.  MUSCULOSKELETAL: Chronic back  pain.  CARDIOVASCULAR:  No edema.  No new palpitations.  No tachycardia.  She did have PT/INR done at the office today.  ENDOCRINE:  No excessive  thirst, does have some night sweats, no polyuria NEUROLOGIC:  No  headache.  GENERAL: She does have weakness.  She thinks she has lost  weight, but she is not sure how much.  ENT: No nose bleeds.  DERMATOLOGIC: No history of skin cancer.  No rashes, no itching. All  other Review of Systems are negative.   LABORATORY:  Hemoglobin 11.3, hematocrit 33.6.  Platelets 407,000.  White blood cell count 6.4, MCV 94.  PT is 34.7, INR 7.0, and that is  from the [place] office earlier today.  BUN 8, creatinine 0.91.  Sodium  135, potassium 3.8.  Urinalysis: Large amount of blood. On the  microscopic, she had 7-10 red blood cells per high powered field.  A few  squamous epithelial cells present.   PHYSICAL EXAMINATION:  GENERAL:  The patient is a pale, elderly white  female who is a bit anxious and appears depressed.  She is a somewhat  vague historian, but she is alert and oriented x3.  VITAL SIGNS: Blood pressure 141/88, pulse 89, respirations 22,  temperature 98.6, room air saturation 93%.  HEENT: Sclera nonicteric.  Conjunctiva is pink.  Oropharynx:  Mucosa is  moist and clear.  Dentition in good repair.  NECK:  No masses, no JVD.  PULMONARY: There is no shortness of breath.  No cough. Her breath sounds  are excellent and clear.  CARDIOVASCULAR: Irregularly irregular. Rate is  within normal limits.  No  murmurs, rubs or gallops.  GI: Abdomen is soft, nontender, nondistended with active bowel sounds.  There is no hepatosplenomegaly, no masses.  RECTAL:  Stool is so scant that it is difficult to characterize the  exact color, whether it is brown or black. It is fecal-occult-blood  positive.  She has large external hemorrhoids which do not show any  stigmata of recent bleeding.  EXTREMITIES:  There is bilateral ankle swelling more consistent with  joint disease rather than cardiovascular or lymphatic edema.  MUSCULOSKELETAL:  She is kyphotic. There is some deformity of the joints  of the knee and ankle.  NEUROLOGIC:  The patient has a benign facial tic. She is alert and  oriented x3.  She moves all fours easily. She stands and walks without  assistance.  PSYCHIATRIC: Affect is blunted.  She appears somewhat depressed and  anxious.  Overall quite appropriate.   IMPRESSION: 1. Gastrointestinal bleed.  It sounds like a lower GI bleed,      especially given the normal BUN. Question as to whether or not this      is diverticular in nature.  The blood is not red enough from the      sounds to represent hemorrhoidal source of the bleeding.  2. Chronic low-level, scant blood per rectum secondary to hemorrhoids.  3. Greater than 5 weeks of left upper quadrant pain following a bout      of diverticulitis.  4. Anxiety.  5. Constipation prone irritable bowel syndrome.  6. Chronic Coumadin secondary to a history of atrial fibrillation.      She has an iatrogenic coagulopathy currently.  7. Chronic narcotics for management of chronic musculoskeletal pain.   PLAN:  1. The patient is to be admitted to the service of Dr. Stan Head.  2. We will continue most of her outpatient medications with the      exception of vitamin supplements, Actonel and Coumadin.  3. Will allow clear liquids.  4. The patient is not hypotensive and will maintain IV fluids at [facility]      for the  time being.  5. Plan to give the patient a small amount of subcutaneous vitamin K      and follow up a.m. labs, repeat CBC and PT/INR.      Jennye Moccasin, PA-C      Iva Boop, MD,FACG  Electronically Signed    SG/MEDQ  D:  03/14/2006  T:  03/14/2006  Job:  [id number]   cc:   Vania Rea. Jarold Motto, MD, Caleen Essex, FAGA  Titus Dubin. Alwyn Ren, MD,FACP,FCCP  Noralyn Pick. Eden Emms, MD, [facility]

## 2010-06-08 NOTE — H&P (Signed)
[place]  Patient:    ROSELAND, BRAUN Visit Number: [id number] MRN: [id number]          Service Type: MED Location: [id number] Attending Physician:  Corlis Leak. Dictated by:   Valetta Mole Swords, M.D. [facility] Admit Date:  07/19/2001   CC:         Titus Dubin. Alwyn Ren, M.D. [facility]  Lemmie Evens, M.D.  Thyra Breed, M.D.   History and Physical  CHIEF COMPLAINT:  Syncope.  HISTORY OF PRESENT ILLNESS:  The patient is a 77 year old female with a complicated recent medical history.  Early this morning the patient got out of bed, the power was out, and the room was dark.  She suffered her usual "excruciating" posterior neck pain, felt dizzy, and then the next thing she remembers is waking up on the floor.  She denies any new pains.  She denies any trauma to her head.  She denies any trauma to any extremities.  Her main complaint this morning has been persistent, severe, neck pain.  Specifically, the patient denies any prodromal symptoms prior to falling down other than dizziness and neck pain.  She denies any lightheadedness, diaphoresis, shortness of breath, chest pain.  She denies any fevers or chills.  She does admit to taking effexor 2 mornings ago, which made her feel nauseated and dizzy.  She has not taken any since then (that was her first effexor).  No other recent medication changes have taken place.  The patient has a 2 month history of severe posterior neck pain.  The pain is most extensive when she stands or sits up.  Pain increases when she turns, or flexes or extends her neck.  At rest, the patient denies any neck pain.  She currently has no neck pain lying on the hospital examining table.  She denies any radiation of the pain to her shoulders or arms.  She denies any lower extremity weakness.  She denies any upper extremity weakness.  PAST MEDICAL HISTORY:  1. Significant for neck discomfort.  She has been evaluated by  Dr. Alwyn Ren,     Thyra Breed, and Dr. Jimmy Footman.  2. Hypertension, anxiety disorder, irritable bowel syndrome.  PAST SURGICAL HISTORY:  1. Partial gastrectomy and vagotomy for a duodenal ulcer.  2. She has had a cholecystectomy  3. Appendectomy  4. Total abdominal hysterectomy.  NOTE:  She denies any other surgeries.  CURRENT MEDICATIONS:  1. Tiazac 120 mg p.o. q.d.  2. Diazepam 2.5 mg p.o. b.i.d. p.r.n.  3. Levsin p.r.n.  4. Darvocet p.r.n.  5. Bextra 10 mg p.o. q. day p.r.n.  6. Clonopin (not used yet).  7. Ambien p.r.n. at night.  8. Sucralfate 1 gram q.6h. p.r.n.  9. Axid 150 mg p.o. q. day. 10. Premarin 0.625 mg p.o. q. day. 11. Hydrochlorothiazide 12.5 mg p.o. q. day. 12. Actonel weekly. 13. She has been prescribed effexor.  It made her feel sick and dizzy.  She     did not take any more than the 1 pill mentioned above.  ALLERGIES:  Zanaflex and TALWIN  SOCIAL HISTORY:  She lives with her husband.  She has family members in town who help care for her.  She is a nonsmoker.  FAMILY HISTORY:  Noncontributory.  REVIEW OF SYSTEMS:  She denies any chest pain, shortness of breath, paroxysmal nocturnal dyspnea, or orthopnea.  She denies any abdominal pain, nausea, vomiting, GI blood loss.  She denies any lower extremity weakness.  She denies any lower extremity edema.  She denies any other complaints in the review of systems.  PHYSICAL EXAMINATION:  VITAL SIGNS:  Temperature 97.8, pulse 73, respirations 20, room air saturation 97%, blood pressure 155/88.  GENERAL:  She appears as a well-developed well-nourished female in no acute distress.  HEENT:  Atraumatic, normocephalic.  Extraocular muscles are intact.  Pupils react to light.  NECK:  Supple with full range of motion of her C-spine.  She has full flexion and extension.  Movement of her neck causes significant pain.  She has full range of motion of her shoulders.  CHEST:  Clear to auscultation without any  increased work of breathing.  CARDIAC:  S1, S2 are normal without significant gallop.  PMI is normal.  ABDOMEN:  Active bowel sounds.  Soft, nontender.  There is no hepatosplenomegaly.  No masses are palpated.  EXTREMITIES:  There is no clubbing, cyanosis or edema.  She has full range of motion of her knees and hips bilaterally.  Deep tendon reflexes are normal in the upper extremities and at the knee.  NEUROLOGIC:  She is alert and oriented.  Strength in the upper extremities is normal.  LABORATORY DATA:  Sodium 134, glucose 122, otherwise BMET normal.  CBC is normal except for a platelet count of 430,000.  CK and troponin I are normal. Urinalysis significant for 40 mg per deciliter of ketones, 30 mg per deciliter of protein.  Microscopy 0-2 white blood cells per high power field.  EKG:  Demonstrates normal sinus rhythm and is a normal EKG.  CT:  CT of the head without significant findings.  Multiple extremity radiology procedures are pending.  CT of the neck is pending.  ASSESSMENT AND PLAN:  1. Syncope.  I suspect that this is vasovagal but she does not give a     prodromal history consistent with that.  I think she does further     evaluation.  We will rule out for MI.  We will maintain the patient on     telemetry.  We will check an EKG in the morning.  We will check an     echocardiogram.  2. Neck pain.  This his chronic in nature worsening for 2 months.  She has     had multiple evaluations.  MRI of the C-spine was done June 26, 2001, and     demonstrated degenerative changes.  The MRI was not adequately able to     visualize potential neural foramina narrowing at C6-7 and C7-T1.  There     was 3 mm of anterolisthesis at C5-6.  PLAN:  We will use a Duragesic patch     for pain control.  We will try to keep her appointment for July 28, 2001,    with Dr. Vear Clock for a "nerve block".  We will check the sedimentation     rate.  3. Hypertension:   We will monitor in the  hospital.  Continue her home     medications.  4. Gastroesophageal reflux disease, followed by Dr. Jarold Motto.  Will     continue current medications. Dictated by:   Valetta Mole Swords, M.D. [facility] Attending Physician:  Corlis Leak DD:  07/19/01 TD:  07/19/01 Job: [id number]

## 2010-06-08 NOTE — Discharge Summary (Signed)
NAME:  Lindsey [name], Lindsey [name]                    ACCOUNT NO.:  [id number]   MEDICAL RECORD NO.:  [id number]                   PATIENT TYPE:  INP   LOCATION:  5154                                 FACILITY:  [facility]   PHYSICIAN:  Lindsey [name], M.D. [facility]         DATE OF BIRTH:  [birth year]   DATE OF ADMISSION:  11/05/2001  DATE OF DISCHARGE:  11/08/2001                                 DISCHARGE SUMMARY   ADMISSION DIAGNOSES:  1. Nausea and vomiting, possibly iatrogenic.  2. Ileus.  3. Chronic pain syndrome.  4. Dehydration.  5. Hyponatremia.  6. Constipation.   DISCHARGE DIAGNOSES:  1. Nausea and vomiting, resolved.  2. Ileus, resolved.  3. Chronic pain syndrome, active.  4. Leukocytosis, resolved.  5. Anemia, stable.  6. Hyponatremia, corrected.  7. Hypokalemia.   BRIEF HISTORY:  Lindsey [name] is an unfortunate 77 year old female with  chronic pain syndrome who presents with abdominal pain with nausea and  vomiting of three days duration. These symptoms began after initiating the  pain medication, Avinza, for chronic pain. She states she had been vomiting  3 to 10 times a day. Abdominal pain was moderate. Chronic pain syndrome  relates to cervical spine area pain. She had had no bowel movements for  several days.   Significant past history is that she had been on Zelnorm for chronic  constipation from Dr. Jarold [name].   She was placed on IV fluids with IV antinausea medications. A protein pump  inhibitor was given intravenously. Her chronic pain medication, Vicodin  5/500, was continued.   Her pain was suboptimally controlled, and pharmacy provided communication on  10/18 concerning excess acetaminophen administration in view of the fact she  was taking Vicodin two tablets every four hours. They had recommended  changing from Vicodin to oxycodone. They also asked the attending to  consider increasing the Vicodin. She did have edema, and therefore  increasing the Vioxx was  not pursued. She was changed to Bextra 10 mg twice  a day.   There was dramatic improvement in ileus and clinical state. On the day of  discharge, she had been on solid foods for over 12 hours with no difficulty.  At the time of discharge, the only complaint was nervous leg syndrome.  This is a chronic problem for which she takes Ativan with good response.   Her initial labs revealed a white count of 15,200 with a hematocrit of 35.9.  With hydration, her hematocrit dropped to 30.7. The day prior to discharge,  her hematocrit was stable at 32. White count was 6,700. She was afebrile.  The initial differential had revealed a left shift.   Initial chemistries and electrolytes revealed a sodium of 124. This was  corrected and was 139 by the time of discharge. She did have mild  hypokalemia with a value of 3.4, after initial potassium of 3.8. Glucose was  elevated at 133 but was corrected to 107. She exhibited no azotemia  despite  the nausea and vomiting. Liver functions were normal. Lipase was normal. B12  level was low normal at 280 with normals of 211 to 911. Urinalysis revealed  0 to 2 white cells and trace hemoglobin.   Because of her stability, she was discharged on 10/19.   DISCHARGE MEDICATIONS:  Her diltiazem will be changed to metoprolol 50 mg a  half twice a day because of the constipation which may be aggravated by the  calcium-channel blocker. Lasix will be stopped, and she will be changed from  hydrochlorothiazide to Maxzide 50/75 one half daily. This hopefully will  prevent any hypokalemia. It would appear to be suboptimal clinically for her  to be on both furosemide and a thiazide diuretic. She will have Reglan 10 mg  before bedtime if she is having nausea. She will continue Bextra 10 mg twice  a day. In addition to lorazepam 1 mg four times a day as needed, she will  employ quinine sulfate 325 mg at bedtime as needed for restless leg  syndrome. Consideration could be given  to changing the lorazepam to  clonazepam 1 mg for both anxiety and restless leg syndrome. She will  continue her Aciphex at home.   She will also continue her Vicodin with adjustments by Dr. Thyra Breed who  she will see on 11/12/01.   DISCHARGE CONDITION:  Discharge status is improved. Prognosis is somewhat  guarded based on her advanced age and multisystem issues.   DIET:  Diet will be as tolerated.                                                 Lindsey [name], M.D. [facility]    [facility]/MEDQ  D:  11/08/2001  T:  11/09/2001  Job:  [id number]   cc:   Lindsey [name], M.D.   Lindsey [name], M.D. [facility]

## 2010-06-08 NOTE — Letter (Signed)
April 08, 2006    Titus Dubin. Alwyn Ren, MD,FACP,FCCP  [phone] [place], [place] [address]   RE:  Lindsey [name], Lindsey [name]  MRN:  [id number]  /  DOB:  06/26/1923   Dear Annette Stable:   Lindsey [name] was recently discharged from [facility] from  February 22-26, 2008.  She has multiple primary care issues and multiple  cardiovascular problems revolving around chronic congestive heart  failure, chronic anemia, chronic hypokalemia, iatrogenic coagulopathy,  etc., etc.  During her hospitalization she had some rectal bleeding and  had a complete GI workup that only showed some bleeding hemorrhoids.  She is followed cardiovascular-wise by Dr. Eden Emms.   Since her discharge she continues to have some loose stools but denies  rectal bleeding or abdominal pain.  She is taking Protonix regularly for  acid reflux problems.  As per our pervious note, she has had multiple  previous surgical procedures including vagotomy, partial gastrectomy,  total abdominal hysterectomy, appendectomy, and cholecystectomy.   I went ahead today and placed this patient on Align probiotic therapy  and renewed her Protonix.  She has complained of rather massive edema in  her lower extremities, and I have asked my  nurse to make an appointment for her to see you in your office this week  to coordinate her problems.  I really do not think at this time that any  of her main problems are gastrointestinal in nature.  She has a very  detailed and excellent discharge summary from Dr. Juanda Chance on March 18, 2006.    Sincerely,      Vania Rea. Jarold Motto, MD, Caleen Essex, FAGA  Electronically Signed    DRP/MedQ  DD: 04/08/2006  DT: 04/09/2006  Job #: [id number]

## 2010-06-08 NOTE — Discharge Summary (Signed)
NAMEJULES, Lindsey [name]               ACCOUNT NO.:  [id number]   MEDICAL RECORD NO.:  [id number]          PATIENT TYPE:  INP   LOCATION:  6707                         FACILITY:  [facility]   PHYSICIAN:  Hedwig Morton. Juanda Chance, MD     DATE OF BIRTH:  1923-06-16   DATE OF ADMISSION:  03/14/2006  DATE OF DISCHARGE:  03/18/2006                               DISCHARGE SUMMARY   ADMISSION DIAGNOSES:  1. Gastrointestinal bleed, sounds like a lower gastrointestinal bleed,      rule out diverticular, rule out hemorrhoidal sources.  2. Chronic low-level scant blood per rectum, presumed secondary to      hemorrhoids.  3. Greater than four-week history of left upper quadrant discomfort      following a bout of presumed diverticulitis.  4. Anxiety.  5. Constipation-prone irritable bowel syndrome.  6. History of atrial fibrillation treated with chronic daily Coumadin.      The patient coagulopathic have international normalized ratio of 7      on admission.  7. Chronic musculoskeletal pain requiring chronic narcotics for      control.  8. Vasovagal syncope associated with neck pain, June of 2003.  9. Cardiomegaly.  10.Osteoporosis.  11.Degenerative spine disease.  12.History of diverticulosis and diverticulitis.  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  14.Status post total abdominal hysterectomy.  15.Status post appendectomy.  16.Status post cholecystectomy.  17.Hypertension.  18.Gastroesophageal reflux disease.  She has been Helicobacter pylori-      negative in the past.  19.Status post benign breast biopsy in August of 2007, pathology that      of fibrosis.  20.History of antibiotic treatment of presumed bacterial overgrowth.   DISCHARGE DIAGNOSES:  1. Lower gastrointestinal bleed from hemorrhoids.  2. Diverticulosis, no evidence for diverticulitis.  Gastrointestinal      bleeding less likely secondary to diverticulosis but remains a      possible  etiology of the gastrointestinal bleed.  3. Coagulopathy, corrected.  Will resume Coumadin at discharge.  4. Chronic atrial fibrillation with daily Coumadin.  5. Irritable bowel syndrome, constipation prone.  Should continue      MiraLax.  6. Mild normocytic anemia, did not require transfusion during this      admission.  7. Hyperglycemia with maximum serum glucose of 121.  Patient not known      to have a history of diabetes or glucose intolerance.   PROCEDURE:  Colonoscopy by Dr. Lina Sar on March 17, 2006.   CONSULTATIONS:  None.   BRIEF HISTORY:  The patient is an 77 year old white female followed by  Dr. Alwyn Ren, Dr. Eden Emms, and Dr. Sheryn Bison.  She has a remote  history of ulcers for which she is status post vagotomy, gastrectomy,  Billroth II anastomosis.  She also has a history of diverticulitis.  About 5 weeks prior to this admission, she took a two-week course of  Cipro and Flagyl prescribed by an urgent care physician for presumed  diverticulitis.  She was having left upper quadrant pain similar to pain  she had with  other bouts of diverticulitis.  The pain is not completely  resolved.  On the February 17, 2006, she had completed antibiotics by  this time.  A CT scan of the abdomen showed no diverticulitis, no  diverticulosis, some tapering of the colon at the splenic flexure, and  no masses.  When Dr. Jarold Motto saw her on February 20, 2006, he thought  that she might be suffering from subacute diverticulitis, as she was  still having lingering left upper quadrant pain.  She was not having any  fevers or nausea or vomiting.  She went home with Hemoccult cards.  Three of six of these were fecal occult blood positive.  Dr. Jarold Motto  planned March 17, 2006 colonoscopy/endoscopy for this patient while  she was on Coumadin so it would be a diagnostic study only.  Her last  colon and endo were in 2003 at which time she had diverticulosis and an  anastomotic  ulcer.   Other GI issues with this patient include some nausea on occasion,  nothing new; no emesis.  She manages her constipation pretty well with  MiraLax, using this as needed.  In the last few days she has been having  pebble-like bowel movements.   About 8:30 this morning and again at about 1:30 today, she passed wine-  colored bloody material.  She did not have any syncope but had been  having some generalized weakness for the last four days.  She came to  the emergency room and was admitted for further evaluation and care by  Dr. Stan Head.  While in the ER, hemoglobin measured 11.3.  Blood  pressure and pulse were good at 141/88 and 89, respectively.   LABORATORY DATA:  Hemoglobin initially 11.3, 11.2 at discharge.  Hematocrit 33.6 on admission, 32.5 at discharge.  MCV 94.  Platelets  ranged 389-437,000.  White blood cell count 6.4.  PT 55.7, INR 5.7 and  PTT 98 on admission.  PT 19.6, INR 1.6 on March 16, 2006.  Sodium  135, potassium low at 3.1, chloride 105, CO2 27.  Glucose ranging 121-  91.  BUN 8, creatinine 0.9.  Blood cultures negative.  Urine cultures  negative growth to date.  Urinalysis showed 7-10 red blood cells per  high powered field, a few epithelial cells, no nitrites, no leukocyte  esterase.   HOSPITAL COURSE:  Problem 1.  GI bleed.  The patient was monitored with  serial hemoglobins and hematocrits.  The bleeding resolved over the  course of the hospitalization.  She did not have a repeat large-volume  hematochezia while hospitalized.  She completed her colonoscopy prep,  and Dr. Juanda Chance found grade 1 internal hemorrhoids and diverticulosis.  The colon was tortuous.  There was just minimal diverticulosis in the  sigmoid colon.  No stigmata of any bleeding was noted anywhere.  It was  felt that probably the hemorrhoids were the source for the bleeding in  the setting of such a high INR.  The patient was prescribed Anusol hydrocortisone suppositories to  be  used at bedtime, and these will continue to be used as needed at home.  If she continues to have problems with chronic bleeding despite the use  of Anusol, it is probably going to be time to assess whether or not she  needs the Coumadin.  However, if she has minor amounts of bleeding and  her hemoglobin and hematocrit remain stable, she may be able to tolerate  a low level of GI bleeding with use  of p.r.n. suppositories.   Problem 2.  Iatrogenic coagulopathy.  The patient's INR was corrected  with a small 3 mg dose of subcutaneous vitamin K.  Her INR did approach  normal when rechecked.  Plan is to resume the patient's Coumadin, but  she should follow up next week with the Coumadin clinic in order to  ascertain that her Coumadin is at a safe dose.   Problem 3.  Mild anemia.  The patient did not require transfusions with  any blood products during this admission.   Problem 4.  Hypokalemia.  The patient was supplemented with oral  potassium.   Problem 5.  Atrial fibrillation.  The patient was not admitted to a  telemetry bed.  She did remain stable.  At times her heart rate was  regular, other times it was irregular, but it was never rapid.   Problem 6.  Temperature/fever to 101.4 on March 15, 2006.  This was  an isolated elevated temperature.  Blood cultures, urine cultures were  obtained but unrevealing.  She did not have any ongoing fevers, and her  white count was within normal limits.   DISCHARGE MEDICATIONS:  1. Protonix 40 mg p.o. daily.  2. Actonel 35 mg, one tablet p.o. every week.  3. Ambien CR 6.25 mg p.o. q.h.s.  4. Neurontin 100 mg p.o. daily.  5. Hydrocodone/APAP 10/325 mg one p.o. up to five times daily p.r.n.  6. Phenergan 12.5 mg p.o. b.i.d. p.r.n.  7. Spirolactone 25 mg p.o. daily.  8. Diazepam 5 mg one-half p.o. p.r.n. once to twice daily.  9. Premarin 0.625 mg one p.o. daily.  10.Toprol-XL 25 mg one p.o. daily.  11.Caltrate 600 mg one p.o. daily.   12.Multivitamin one p.o. daily.  13.Vitamin D and B6 supplement one tablet daily.  14.Viactiv supplement two tablets p.o. daily.  15.Coumadin 2.5 mg Tuesday and 1.25 mg Monday, Wednesday, Thursday,      Friday, Saturday, Sunday.  16.MiraLax one capful once to twice daily as needed.  17.Anusol-HC suppositories, one per rectum at bedtime for 3-4 days at      a time as needed for rectal bleeding.   FOLLOW UP:  She is to go to the Coumadin Clinic at [facility] next week,  and she can follow up with Dr. Jarold Motto as needed.  If she develops  further significant rectal bleeding or rectal bleeding persists, she  should contact Dr. Sheryn Bison and also let Dr. Eden Emms know.  She  may need to have her Coumadin rechecked a little more frequently if she  does continue to have bleeding along with blood counts.   CONDITION ON DISCHARGE:  Stable and improved.      Jennye Moccasin, PA-C      Hedwig Morton. Juanda Chance, MD Electronically Signed    SG/MEDQ  D:  03/18/2006  T:  03/18/2006  Job:  [id number]   cc:   Vania Rea. Jarold Motto, MD, [facility], FACP, FAGA  Noralyn Pick. Eden Emms, MD, [facility]

## 2010-06-08 NOTE — Letter (Signed)
April 15, 2006    Vania Rea. Jarold Motto, MD, FACG, FACP, FAGA  [address]  [place], [place] [address]   RE:  CALA, KRUCKENBERG  MRN:  [id number]  /  DOB:  [birth year]   Dear Onalee Hua:   Thank you for referring Ms. Cobey (Lib) for evaluation of her edema.   I reviewed in detail the hospital records from February 20 to March 18, 2006.   I believe that the edema is multifactorial.  Her GFR is low normal at  59, although her creatinine is well within normal limits @ 0.91.  She  has a total albumin of 2.2; she is apparently liberalizing salt in her  diet.   I have recommended that she not cook with salt or add it at the table.  She could use the product NoSalt  as seasoning.  I also recommend  getting soy protein from [facility], ingesting 15 gm per day to supplement the  low albumin.   I could not appreciate evidence of heart failure; she does have atria  fib with a slow rate.Specifically she had no NVD, HJR, or rales.   Additionally, she does have significant lipedema and venous  insufficiency.  I will Rx over the calf support hose.   Based on her response to these maneuvers, then she could be seen by Dr.  Charlton Haws with repeat echocardiogram or other evaluations as per his  expertise.    Sincerely,      Titus Dubin. Alwyn Ren, MD,FACP,FCCP  Electronically Signed    WFH/MedQ  DD: 04/15/2006  DT: 04/15/2006  Job #: [id number]   CC:    Noralyn Pick. Eden Emms, MD, [facility]

## 2010-06-08 NOTE — Op Note (Signed)
NAME:  Lindsey [name], Lindsey [name]          ACCOUNT NO.:  [id number]   MEDICAL RECORD NO.:  [id number]          PATIENT TYPE:  AMB   LOCATION:  DSC                          FACILITY:  [facility]   PHYSICIAN:  Adolph Pollack, M.D.DATE OF BIRTH:  1923/06/02   DATE OF PROCEDURE:  09/03/2005  DATE OF DISCHARGE:                                 OPERATIVE REPORT   PREOPERATIVE DIAGNOSIS:  Right breast mass.   POSTOPERATIVE DIAGNOSIS:  Right breast mass.   PROCEDURE:  Right breast biopsy.   SURGEON:  Adolph Pollack, M.D.   ANESTHESIA:  Local (1% lidocaine without epinephrine plus 0.25% Marcaine  plus sodium bicarbonate) with MAC.   INDICATIONS:  This is a 77 year old female who was discovered to have a  right breast mass at about the 10 to 11 o'clock position.  This has  persisted.  A mammogram did not show anything back in May.  However, the  mass was found after then.  Ultrasound in my office demonstrated a  combination of solid and cystic type mass and she now presents for excision.  We have discussed the procedure, risks, and aftercare preoperatively.   TECHNIQUE:  She is seen in the holding area and the area of the mass was  marked with my initials in the right breast.  She is brought to the  operating room and placed supine on the operating table and given  intravenous sedation.  The right breast was sterilely prepped and draped.  Local anesthetic was infiltrated superficially and deep in the upper outer  quadrant and a curvilinear incision was made through the skin and  subcutaneous tissue.  The mass was palpated and grasped with Allis forceps  and the mass and surrounding what appeared be normal breast type tissue was  then excised using electrocautery and sent to the lab.  The wound was  inspected and local anesthetic was infiltrated deep into the wound.  Once  hemostasis was adequate, the wound was closed in two layers closing the  subcutaneous tissue with interrupted 3-0 Vicryl  sutures and closing the skin  with a running 4-0 Monocryl subcuticular stitch.  Steri-Strips and sterile  dressing were applied.   She tolerated the procedure well without any apparent complications and was  taken to the recovery room in satisfactory condition.  She will be given  discharge instructions and follow-up in the office in about 7-10 days.      Adolph Pollack, M.D.  Electronically Signed    TJR/MEDQ  D:  09/03/2005  T:  09/03/2005  Job:  [id number]   cc:   Malachi Pro. Ambrose Mantle, M.D.  Titus Dubin. Alwyn Ren, MD,FACP,FCCP

## 2010-06-08 NOTE — Assessment & Plan Note (Signed)
[facility]                              CARDIOLOGY OFFICE NOTE   NAME:Lindsey [name], Lindsey [name]              MRN:          [id number]  DATE:11/29/2005                            DOB:          03/21/1923    Lindsey [name] returns today for followup.  She has chronic atrial fibrillation with  good anticoagulation.  Her Coumadin has been up and down.  At times, she has  had to have it checked every two weeks.  She has not had any bleeding  diathesis.  She had multiple previous GI problems and reflux.  She has had  guaiac-negative stool and sees Dr. Corinda Gubler for this.   Her review of systems is otherwise remarkable for recent right breast  surgery to remove a benign lump.  This was discovered by Dr. Ambrose Mantle and the  patient.  She is otherwise doing well.  She denied any significant chest  pain, palpation, PND, or orthopnea.   She has had a distant cath, which was normal, and has no previous structural  heart disease.   Her LV function has been normal in the past.   Her medications are listed in the chart.  From a cardiac perspective, she  takes Toprol 25 mg a day and has had good rate control on this.   PHYSICAL EXAMINATION:  HEENT:  Normal.  SKIN:  Warm and dry.  There is no bruising.  NECK:  There is no lymphadenopathy, no thyromegaly.  There is no carotid  bruit.  LUNGS:  Clear.  HEART:  S1, S2, with a soft systolic murmur.  ABDOMEN:  Benign.  LOWER EXTREMITIES:  Intact pulses, no edema.   IMPRESSION:  Chronic atrial fibrillation, good rate control on  anticoagulation.  Continue beta blocker and Coumadin.  The patient does not  have evidence of other structural heart disease.  She is not having chest  pain or shortness of breath.  She will continue to follow up with Dr. Alwyn Ren  for her medical needs.  I asked her about her flu shot today and she says  she normally does not take these.   We will see her back in six months.    ______________________________  Noralyn Pick. Eden Emms, MD, [facility]    PCN/MedQ  DD: 11/29/2005  DT: 11/29/2005  Job #: [id number]

## 2010-06-08 NOTE — Assessment & Plan Note (Signed)
[facility]                         GASTROENTEROLOGY OFFICE NOTE   NAME:Grandinetti, KRIS BURD              MRN:          [id number]  DATE:02/20/2006                            DOB:          [birth year]    Lindsey [name] called in recently with an episode of lower abdominal  pain, consistent with her previous episodes of diverticulitis.  She was  seen in urgent care and was treated for diverticulitis and was placed on  Flagyl 500 mg b.i.d. and Cipro 500 mg twice a day for 10 days.  She  returns today and is fairly asymptomatic.  It has never been documented  she actually had diverticulitis, and I repeated her CT scan on February 17, 2006 that was normal in terms of any evidence of inflammatory  diverticulitis.  She had had colonoscopy by Dr. Juanda Chance in 2003, which  was unremarkable, except for diverticulosis.   Mrs. Carriker relates that she usually gets left upper quadrant pain  when these attacks occur, and her last one was a year ago.  Both have  been precipitated by eating seedy substances.  Has chronic functional  constipation related to taking hydrocodone four times a day, along with  Caltrate daily.  She uses MiraLax on a regular basis.  She says fiber  substances in the past have made her constipation worse.  She had a  previous vagotomy and partial gastrectomy and has a Billroth II  gastroenterostomy.  She is on chronic Protonix therapy.  She is treated  periodically for bacterial overgrowth syndrome depending on her clinical  course.  She has been checked in the past for H. pylori, and this has  been negative.   PHYSICAL EXAMINATION:  GENERAL:  Awake and alert, in no acute distress,  appearing older than her stated age.  VITAL SIGNS:  She weighs 171 pounds, and blood pressure is 120/70, pulse  68 and regular.  ABDOMEN:  Unremarkable without hepatosplenomegaly, masses or tenderness.  Bowel sounds were normal.   ASSESSMENT:  I suspect Ms.  Godek did have an episode of subacute  diverticulitis and seems to be doing well at this time.  Her  constipation will continue to be a problem as long as she on codeine.   RECOMMENDATIONS:  1. Regular MiraLax at bedtime.  2. Continue PPI therapy.  3. High fiber diet as tolerated with avoidance of seedy substances.  4. Check Hemoccult cards and decide if follow up colonoscopy is      indicated.  5. Continue multiple medications including Coumadin therapy for      chronic atrial fibrillation.      This patient is at high risk for any type of endoscopic procedure.  6. Continue other medications as per Dr. Alwyn Ren and Dr. Eden Emms.     Vania Rea. Jarold Motto, MD, [facility], FAGA  Electronically Signed    DRP/MedQ  DD: 02/20/2006  DT: 02/20/2006  Job #: [id number]   cc:   Noralyn Pick. Eden Emms, MD, [facility]  Titus Dubin. Alwyn Ren, MD,FACP,FCCP

## 2010-06-08 NOTE — Discharge Summary (Signed)
Lindsey [name]. [facility]  Patient:    Lindsey [name], Lindsey [name] Visit Number: [id number] MRN: [id number]          Service Type: MED Location: [phone] Attending Physician:  Judie Petit Dictated by:   Janora Norlander, N.P. Admit Date:  07/19/2001 Discharge Date: 07/21/2001                             Discharge Summary  ADMISSION DIAGNOSIS:  Syncope, rule out myocardial infarction.  DISCHARGE DIAGNOSIS:  Likely vasovagal syncopal episode related to cervical pain.  PRIMARY M.D.:  Titus Dubin. Alwyn Ren, M.D. [facility].  Lemmie Evens, M.D. and Thyra Breed, M.D.  DISCHARGE MEDICATIONS:  The patient is to return to her preadmission medications.  1. Tiazac 120 mg p.o. q.d.  2. Diazepam 2.5 mg p.o. b.i.d. p.r.n.  3. Levsin p.r.n.  4. Darvocet p.r.n.  5. Bextra 10 mg p.o. q.d. p.r.n.  6. Klonopin not used yet per patient.  7. Ambien p.r.n. at night.  8. Sulfafate 1 gram q.6h. p.r.n.  9. Axid 150 mg p.o. q.d. 10. Premarin 0.625 mg p.o. q.d. 11. Hydrochlorothiazide 12.5 mg p.o. q.d. 12. Actinelle weekly. 13. She was prescribed Effexor, it made her feel sick and dizzy, and she has     not taken more than one pill. 14. Phenergan 12.5 mg p.o. q.4-6h. p.r.n. nausea.  ALLERGIES:  Lindsey [name].   FOLLOW-UP:  The family is currently trying to move the appointment with Dr. Vear Clock from July 7 to today, July 1.  LABORATORY DATA:  Ionized calcium 1.23, magnesium 2.3, BMET; sodium 129, potassium 3.3, chloride 93, CO2 28, glucose 104, BUN 7, creatinine 0.9, calcium 8.6.  Troponins were negative x3, they ranged from 0.02 to 0.01. Cardiac markers were also negative x3.  CBC on June 29; WBC 7.1, H&H 13.0 and 37.9, platelets 403.  UA showed ketones at 40 and proteins at 30.  Chest x-ray on June 29, showed cardiomegaly with no active disease.  CT of the cervical spine on June 29, showed no acute bony abnormality, specifically no evidence of fracture or  malalignment.  There is significant facet disease noted in the mid to lower cervical spine with mild to moderate bilateral neural foraminal narrowing from C4 through C5 through the cervicothoracic junction.  X-ray of the hip on June 29, status post fall; mild degenerative changes are present, no fracture, subluxation, or dislocation.  Left knee; mild degenerative changes are present.  No acute bony abnormality.  Right knee; no fractures, degenerative changes are present, no subluxation, no dislocation, or joint effusion.  Views of the spine.  Thoracic spine; again degenerative changes, no acute abnormality, no fracture or malignancy noted. Electrocardiogram on July 20, 2001; overall left ventricular systolic function was normal, LVEF was estimated at 55 to 65%, no left ventricular regional wall motion abnormalities, mild focal basal septal hypertrophy.  Aortic valve was mildly calcified.  There was mild aortic root dilatation.  Read by Noralyn Pick Eden Emms, M.D. [facility]  HOSPITAL COURSE:  [name] was admitted after a syncopal episode that she states occurred when she had gotten up.  The room was dark.  She had excruciating posterior neck pain, felt dizzy, and then the next thing she remembers is waking up on the floor.  The initial concerns were that we had to rule out for an MI which was done.  Secondly, her pain management needed to be addressed.  This was  addressed with Dr. Vear Clock office and she is scheduled to have an occipital nerve block scheduled for July 7.  After a lengthy conversation with the patient and her family, the patients family will attempt to move that appointment to today after discharge.  The patient suffered an epidose of nausea and vomiting which was relieved with Phenergan at 12.5 mg.  She states that she becomes nauseous when she attempts to ambulate and move about and the pain in her neck strikes her.  She states that the pain has been progressively worse over the  past week and a half.  At this point, we will discharge her having ruled out an MI.  We will provide Phenergan for relief of the nausea.  She is instructed to limit her ambulation to a minimum until seen by Dr. Vear Clock for her own safety.  These instructions were provided in the presence of her daughter and husband.  The plan is to attempt to move the appointment with Dr. Vear Clock to today or sooner than July 7, discharge the patient home, and have her follow up with Dr. Alwyn Ren in two weeks.  DISCHARGE PHYSICAL EXAMINATION:  The patient is awake, alert, and oriented, eating her breakfast with the family in the room.  She states that she is able to eat after being medicated for pain and nausea.  VITAL SIGNS: 97.8, 128/68, 74, 18, 94% on room air.  Her current sodium is 129 and her potassium is 3.3, that will be replenished prior to discharge with 40 mEq of KCL.  HEART: Regular rate and rhythm.  LUNGS: Clear to auscultation.  ABDOMEN: Good bowel sounds and soft.  She is cooperative with examination.  EXTREMITIES: No edema. At this point she is stable for discharge, though, with very limited activity.  FOLLOW-UP:  She is to follow up with Dr. Vear Clock as soon as possible Dictated by:   Janora Norlander, N.P. Attending Physician:  Judie Petit DD:  07/21/01 TD:  07/22/01 Job: [id number]

## 2010-06-08 NOTE — H&P (Signed)
NAME:  Lindsey [name], Lindsey [name]                    ACCOUNT NO.:  [id number]   MEDICAL RECORD NO.:  [id number]                   PATIENT TYPE:  INP   LOCATION:  5154                                 FACILITY:  [facility]   PHYSICIAN:  Georgina Quint. Plotnikov, M.D. [facility]      DATE OF BIRTH:  29-Sep-1923   DATE OF ADMISSION:  11/05/2001  DATE OF DISCHARGE:                                HISTORY & PHYSICAL   CHIEF COMPLAINT:  1. Sick to the stomach.  2. Abdominal pain.   HISTORY OF PRESENT ILLNESS:  The patient is a 77 year old female with  abdominal pain and vomiting of three days duration.  It started after she  initiated taking Avinza for chronic pain.  She had been vomiting x3-10 a  day.  Unable to keep much down.  Mild to moderate abdominal pain.  No bowel  movements for several days.  She was brought into the emergency room by the  family.  She was found to have low sodium.   MEDICATIONS:  1. Avinza 60 mg one a day, new.  2. Vicodin 10/325 mg q.5 h. p.r.n.  3. Tiazac 120 mg.  4. Bextra 10 mg a day.  5. Aciphex 20 mg a day.  6. Hydrochlorothiazide 12.5 mg a day.  7. Lasix unknown dose daily.   PAST MEDICAL HISTORY:  1. History of gastrectomy for ulcer.  2. Chronic neck pain.  3. Gastroesophageal reflux disease.  4. Hypertension.   ALLERGIES:  1. TALWIN.  2. IBUPROFEN.  3. A MUSCLE RELAXANT.   SOCIAL HISTORY:  Married.  Does not smoke or drink alcohol.   FAMILY HISTORY:  Both parents are deceased of unknown cause.   REVIEW OF SYSTEMS:  Denies fever.  No urinary symptoms.  Gastrointestinal  symptoms as above.  No chest pain.  Chronic neck pain.  The rest is  negative.   PHYSICAL EXAMINATION:  VITAL SIGNS:  Blood pressure 159/78, pulse 98,  respirations 24, temperature 97.4.  GENERAL:  She is in mildly acute distress.  Appears ill.  HEENT:  Dry mucosa.  NECK:  Supple. No thyromegaly or bruits.  Tender with range of motion.  HEART:  S1, S2 with no murmurs.  No gallops. Slightly  tachycardiac.  ABDOMEN:  Slightly distended.  Tender diffusely. No rebound symptoms.  No  masses felt.  EXTREMITIES:  Lower extremities without edema.  RECTAL:  Emergency room physician.  SKIN:  With aging tenures.  No cyanosis.  NEUROLOGICAL:  Cranial nerves II-XII normal.  DTRs and strength normal..   LABORATORY DATA:  X-ray abdominal with ileus.  Urinalysis normal.  Sodium  124, potassium 3.8, glucose 133, creatinine 0.9.  White count 15.2,  hemoglobin 12.2.   ASSESSMENT/PLAN:  1. Nausea and vomiting likely due to Avinza.  Avinza was discontinued.  IV     fluids.  2. Ileus.  Will keep n.p.o.  Nasogastric tube if needed.  3. Chronic pain.  She will continue with Vicodin 10/325 mg p.o. q.4-5 h.  p.r.n.  4. Dehydration.  IV fluids.  5. Hyponatremia.  Will replace with IV normal saline and give Lasix     tomorrow.  6. Constipation.  Will give Fleets enema.  7. __________ .  Will restart  __________ tomorrow.                                               Georgina Quint. Plotnikov, M.D. [facility]    AVP/MEDQ  D:  11/05/2001  T:  11/05/2001  Job:  [id number]   cc:   Titus Dubin. Alwyn Ren, M.D. [facility]   Mark L. Vear Clock, M.D.   Vania Rea. Jarold Motto, M.D. [facility]

## 2010-06-18 ENCOUNTER — Other Ambulatory Visit: Payer: Self-pay | Admitting: Gastroenterology

## 2010-06-19 ENCOUNTER — Other Ambulatory Visit: Payer: Self-pay | Admitting: Internal Medicine

## 2010-06-19 MED ORDER — POLYETHYLENE GLYCOL 3350 17 GM/SCOOP PO POWD: 17.0000 g | ORAL | Status: DC | PRN

## 2010-06-19 NOTE — Telephone Encounter (Signed)
RX sent to pharmacy  

## 2010-06-25 ENCOUNTER — Ambulatory Visit (INDEPENDENT_AMBULATORY_CARE_PROVIDER_SITE_OTHER): Payer: Medicare Other | Admitting: *Deleted

## 2010-06-25 DIAGNOSIS — Z7901 Long term (current) use of anticoagulants: Secondary | ICD-10-CM

## 2010-06-25 DIAGNOSIS — I4891 Unspecified atrial fibrillation: Secondary | ICD-10-CM

## 2010-06-25 LAB — POCT INR: INR: 2.5

## 2010-07-30 ENCOUNTER — Ambulatory Visit (INDEPENDENT_AMBULATORY_CARE_PROVIDER_SITE_OTHER): Payer: Medicare Other | Admitting: *Deleted

## 2010-07-30 DIAGNOSIS — Z7901 Long term (current) use of anticoagulants: Secondary | ICD-10-CM

## 2010-07-30 DIAGNOSIS — I4891 Unspecified atrial fibrillation: Secondary | ICD-10-CM

## 2010-07-30 LAB — POCT INR: INR: 2.9

## 2010-08-27 ENCOUNTER — Ambulatory Visit (INDEPENDENT_AMBULATORY_CARE_PROVIDER_SITE_OTHER): Payer: Medicare Other | Admitting: *Deleted

## 2010-08-27 DIAGNOSIS — Z7901 Long term (current) use of anticoagulants: Secondary | ICD-10-CM

## 2010-08-27 DIAGNOSIS — I4891 Unspecified atrial fibrillation: Secondary | ICD-10-CM

## 2010-08-28 ENCOUNTER — Encounter: Payer: Self-pay | Admitting: Internal Medicine

## 2010-08-30 ENCOUNTER — Encounter: Payer: Self-pay | Admitting: Internal Medicine

## 2010-09-15 ENCOUNTER — Other Ambulatory Visit: Payer: Self-pay | Admitting: Internal Medicine

## 2010-09-18 ENCOUNTER — Encounter (INDEPENDENT_AMBULATORY_CARE_PROVIDER_SITE_OTHER): Payer: Self-pay | Admitting: General Surgery

## 2010-09-18 ENCOUNTER — Other Ambulatory Visit (INDEPENDENT_AMBULATORY_CARE_PROVIDER_SITE_OTHER): Payer: Self-pay

## 2010-09-18 ENCOUNTER — Ambulatory Visit (INDEPENDENT_AMBULATORY_CARE_PROVIDER_SITE_OTHER): Payer: Medicare Other | Admitting: General Surgery

## 2010-09-18 VITALS — BP 118/78 | HR 58 | Temp 97.0°F | Ht 65.0 in | Wt 188.4 lb

## 2010-09-18 DIAGNOSIS — N6459 Other signs and symptoms in breast: Secondary | ICD-10-CM | POA: Insufficient documentation

## 2010-09-18 DIAGNOSIS — I4891 Unspecified atrial fibrillation: Secondary | ICD-10-CM

## 2010-09-18 NOTE — Progress Notes (Signed)
Chief Complaint  Patient presents with  . Other    eval right nipple discharge     HPI Lindsey [name] is a 77 y.o. female.   HPI  She is referred by Dr. Ambrose Mantle for further evaluation of right nipple discharge. This has been going on for about a year. It had been 2 cases where it is bloody. It is soaking her clothes.  Most of the time it is clear. He had a ductogram which reportedly was unremarkable. She had a recent ultrasound which demonstrated some duct ectasia. She denies any mass in her breast. She denies any trauma to the right breast. She had a sister about her age that was diagnosed with breast cancer in the past.  Past Medical History  Diagnosis Date  . Atrial fibrillation     with bradycardia  . Diverticulitis 2008- 2003  . GERD (gastroesophageal reflux disease)   . Osteoporosis     T score -3.9 @ forearm  . Vitamin D deficiency     Vitamin D 27 in 2010  . Anemia     HX of  . Duodenal ulcer 1976    surgery   . 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 Despina Hick  . Hx of colonoscopy 02/2006    Internal hemorrhoids  . Nipple discharge   . Arthritis   . Blood transfusion during current hospitalization   . Hemorrhoids   . Leg swelling   . Hearing loss     Past Surgical History  Procedure Date  . Appendectomy   . Cholecystectomy   . Tonsillectomy and adenoidectomy   . Breast biopsy     breast lump removed    Family History  Problem Relation Age of Onset  . Emphysema Father 50    deceased  . Other Mother 75    deceased old age  . Diverticulosis Brother   . Coronary artery disease Brother     s/p CABG  . Breast cancer Sister     survivor    Social History History  Substance Use Topics  . Smoking status: Never Smoker   . Smokeless tobacco: Not on file  . Alcohol Use: No    Allergies  Allergen Reactions  . Haloperidol Lactate   . Hydromorphone Hcl     REACTION: SICK ON STOMACH  . Ibuprofen   . Melatonin   . Morphine   . Nortriptyline Hcl    . Pentazocine Lactate     Current Outpatient Prescriptions  Medication Sig Dispense Refill  . Calcium Carbonate-Vitamin D (CALTRATE 600+D) 600-400 MG-UNIT per tablet Take 1 tablet by mouth 2 (two) times daily.        . Cholecalciferol (VITAMIN D3) 2000 UNITS TABS Take 1 tablet by mouth daily.        . diazepam (VALIUM) 5 MG tablet Take 5 mg by mouth every 6 (six) hours as needed.        . docusate sodium (COLACE) 100 MG capsule Take 100 mg by mouth daily.        [name] estrogens, conjugated, (PREMARIN) 0.625 MG tablet Take 0.625 mg by mouth daily. Take daily for 21 days then do not take for 7 days.       . ferrous sulfate 325 (65 FE) MG tablet Take 325 mg by mouth daily with breakfast.        . gabapentin (NEURONTIN) 100 MG capsule Take 100 mg by mouth. 2 tablets at bed time       . HYDROcodone-acetaminophen (  NORCO) 5-325 MG per tablet Take 1 tablet by mouth every 6 (six) hours as needed.        . lidocaine (LIDODERM) 5 % Place 1 patch onto the skin. Remove & Discard patch within 12 hours or as directed by MD       . metoprolol succinate (TOPROL-XL) 25 MG 24 hr tablet Take 25 mg by mouth daily.        . Multiple Vitamin (MULTIVITAMIN) tablet Take 1 tablet by mouth daily.        . naproxen sodium (ALEVE) 220 MG tablet Take 220 mg by mouth as needed.        . pantoprazole (PROTONIX) 40 MG tablet TAKE 1 TABLET DAILY  90 tablet  1  . Probiotic Product (ALIGN) 4 MG CAPS Take 1 capsule by mouth daily.        [name] pyridOXINE (VITAMIN B-6) 100 MG tablet Take 100 mg by mouth daily.        [name] spironolactone (ALDACTONE) 25 MG tablet Take 25 mg by mouth daily.        [name] trolamine salicylate (ASPERCREME/ALOE) 10 % cream Apply topically. As directred       . vitamin B-12 (CYANOCOBALAMIN) 100 MCG tablet Take 250 mcg by mouth daily.        [name] warfarin (COUMADIN) 2.5 MG tablet Take 1 tablet (2.5 mg total) by mouth as directed.  30 tablet  3  . warfarin (COUMADIN) 5 MG tablet Take by mouth as directed.        .  polyethylene glycol powder (MIRALAX) powder Take 17 g by mouth as needed.  255 g  1    Review of Systems Review of Systems  HENT: Positive for hearing loss.   Cardiovascular: Positive for leg swelling.  Gastrointestinal: Positive for constipation.       Hemorrhoids.  Musculoskeletal: Positive for joint pain.  Endo/Heme/Allergies: Bruises/bleeds easily.       Anemia.    Blood pressure 118/78, pulse 58, temperature 97 F (36.1 C), height 5\' 5"  (1.651 m), weight 188 lb 6 oz (85.446 kg).  Physical Exam Physical Exam  Constitutional:       Elderly female in Nad.  Cardiovascular:       Irregular rate, irregular rhythm.  Respiratory:       Right breast with lateral scar. No dominant mass. Clear nipple discharge from the superior aspect.  Left breast with no dominant mass or nipple discharge.  Nodes-no palpable axillary, cervical, or supraclavicular adenopathy.  GI: Soft. There is no tenderness.       Multiple scars.  Musculoskeletal: She exhibits edema.     Data Reviewed Ultrasound report.  Assessment    Right nipple discharge with 2 episodes where it has been bloody. I explained to her that in most peopel this was a benign process. There is a small chance it could be malignant.  She is interested in having something done about it.    Plan    Excision of right nipple ducts and biopsy was discussed with her and her daughter. We went over the procedure and risks.  I have explained the procedure, risks, and aftercare to her.  Risks include but are not limited to bleeding, infection, wound problems, seroma formation, anesthesia.  She seems to understand and agrees with the plan.  She will need preoperative cardiac clearance by Dr. Charlton Haws and we will request this from him.       ROSENBOWER,TODD J 09/18/2010, 3:22 PM

## 2010-09-19 ENCOUNTER — Other Ambulatory Visit (INDEPENDENT_AMBULATORY_CARE_PROVIDER_SITE_OTHER): Payer: Self-pay | Admitting: General Surgery

## 2010-09-19 DIAGNOSIS — I4891 Unspecified atrial fibrillation: Secondary | ICD-10-CM

## 2010-09-26 ENCOUNTER — Ambulatory Visit (INDEPENDENT_AMBULATORY_CARE_PROVIDER_SITE_OTHER): Payer: Medicare Other | Admitting: *Deleted

## 2010-09-26 DIAGNOSIS — I4891 Unspecified atrial fibrillation: Secondary | ICD-10-CM

## 2010-09-26 DIAGNOSIS — Z7901 Long term (current) use of anticoagulants: Secondary | ICD-10-CM

## 2010-09-28 ENCOUNTER — Telehealth: Payer: Self-pay | Admitting: Pharmacist

## 2010-09-28 NOTE — Telephone Encounter (Signed)
Pt and daughter aware okay to hold Coumadin.

## 2010-09-28 NOTE — Telephone Encounter (Signed)
Message copied by Velda Shell on Fri Sep 28, 2010  2:15 PM ------      Message from: Wendall Stade      Created: Fri Sep 28, 2010  1:03 PM       Ok to stop coumadin with no lovenox overlap      ----- Message -----         From: Mariane Masters, PHARMD         Sent: 09/26/2010   2:13 PM           To: Wendall Stade, MD            Pt having breast duct removal on 9/18.  Needs clearance to hold Coumadin 5 days prior and possibly 3 days afterwards.  Thanks.

## 2010-10-04 ENCOUNTER — Telehealth (INDEPENDENT_AMBULATORY_CARE_PROVIDER_SITE_OTHER): Payer: Self-pay

## 2010-10-04 NOTE — Telephone Encounter (Signed)
Pt's daughter called confused about her mom's cardiac clearance.  She was given an appointment with [facility] but she already sees Dr Eden Emms.  It looks like we made the referral for cardiology clearance.  There is a note from [place] that looks like Dr Eden Emms is working on clearance.  I instructed the daughter to call and confirm that with Dr Fabio Bering office.  I also told her to call and cancel the appointment with [facility].  I informed her there is no surgery date yet until we get clearance because she was concerned about when to have her stop the Coumadin.

## 2010-10-05 ENCOUNTER — Telehealth (INDEPENDENT_AMBULATORY_CARE_PROVIDER_SITE_OTHER): Payer: Self-pay

## 2010-10-05 ENCOUNTER — Telehealth: Payer: Self-pay | Admitting: Cardiovascular Disease

## 2010-10-05 NOTE — Telephone Encounter (Signed)
I spoke with the patient's daughter and made her aware I do not see a surgical clearance request in Dr. Fabio Bering paperwork. The patient's daughter states she is not sure that it ever came to [place] to start with because some paperwork they had had [facility] cardiology written on it. She states she called [facility] and made them aware the patient sees [facility]. I explained I would call [facility] and ask them to send [place] a clearance form on her. I have spoken with Arline Asp at [facility] and she will make this request known to [place] who works with Dr. Abbey Chatters. I will forward this to Dr. Fabio Bering nurse for review.

## 2010-10-05 NOTE — Telephone Encounter (Signed)
Lindsey [name] at Dr Fabio Bering office called requesting clearance form be faxed to them for clearance on [facility] . Pt called them and stated she needed clearance prior to surgery with Dr Abbey Chatters.

## 2010-10-05 NOTE — Telephone Encounter (Signed)
Patient daughter Lindsey [name] calling have you receive the fax from [facility] regarding surgery clearance.

## 2010-10-12 NOTE — Telephone Encounter (Signed)
Left message for pts dtr, paperwork received from dr Purnell Shoemaker. Will have dr Eden Emms review Lindsey [name]

## 2010-10-15 ENCOUNTER — Telehealth (INDEPENDENT_AMBULATORY_CARE_PROVIDER_SITE_OTHER): Payer: Self-pay

## 2010-10-15 NOTE — Telephone Encounter (Signed)
Spoke with Lindsey [name] at Dr. Fabio Bering office.  He will review notes sent for CC.  Lindsey [name] will let us know if pt is cleared.

## 2010-10-16 ENCOUNTER — Telehealth: Payer: Self-pay | Admitting: *Deleted

## 2010-10-16 NOTE — Telephone Encounter (Signed)
Spoke with pt, she is aware per dr Eden Emms she is clear for surgery. She was also given clearance to hold her coumadin 5 days prior to the procedure. Will forward to dr rosenbower's office Lindsey [name]

## 2010-10-22 ENCOUNTER — Ambulatory Visit (INDEPENDENT_AMBULATORY_CARE_PROVIDER_SITE_OTHER): Payer: Medicare Other | Admitting: *Deleted

## 2010-10-22 DIAGNOSIS — Z7901 Long term (current) use of anticoagulants: Secondary | ICD-10-CM

## 2010-10-22 DIAGNOSIS — I4891 Unspecified atrial fibrillation: Secondary | ICD-10-CM

## 2010-10-22 LAB — POCT INR: INR: 2.6

## 2010-10-26 ENCOUNTER — Other Ambulatory Visit: Payer: Self-pay | Admitting: Internal Medicine

## 2010-10-30 LAB — COMPREHENSIVE METABOLIC PANEL
Alkaline Phosphatase: 77
BUN: 12
CO2: 28
Chloride: 101
Creatinine, Ser: 1.04
GFR calc non Af Amer: 51 — ABNORMAL LOW
Potassium: 5
Total Bilirubin: 1.3 — ABNORMAL HIGH

## 2010-10-30 LAB — CBC
HCT: 31 — ABNORMAL LOW
Hemoglobin: 10.3 — ABNORMAL LOW
MCV: 90.2
RBC: 3.44 — ABNORMAL LOW
WBC: 6.6

## 2010-10-30 LAB — DIFFERENTIAL
Basophils Absolute: 0
Basophils Relative: 0
Eosinophils Relative: 2
Lymphocytes Relative: 32
Monocytes Absolute: 0.4
Neutro Abs: 3.9

## 2010-10-30 LAB — PROTIME-INR
INR: 1.2
Prothrombin Time: 15.6 — ABNORMAL HIGH
Prothrombin Time: 28.5 — ABNORMAL HIGH

## 2010-11-05 ENCOUNTER — Ambulatory Visit (HOSPITAL_COMMUNITY)
Admission: RE | Admit: 2010-11-05 | Discharge: 2010-11-05 | Disposition: A | Discharge status: Home / Self Care | Payer: Medicare Other | Source: Ambulatory Visit | Attending: General Surgery | Admitting: General Surgery

## 2010-11-05 ENCOUNTER — Other Ambulatory Visit: Payer: Self-pay | Admitting: Cardiovascular Disease

## 2010-11-05 ENCOUNTER — Other Ambulatory Visit (INDEPENDENT_AMBULATORY_CARE_PROVIDER_SITE_OTHER): Payer: Self-pay | Admitting: General Surgery

## 2010-11-05 DIAGNOSIS — N6019 Diffuse cystic mastopathy of unspecified breast: Secondary | ICD-10-CM

## 2010-11-05 DIAGNOSIS — Z01810 Encounter for preprocedural cardiovascular examination: Secondary | ICD-10-CM | POA: Insufficient documentation

## 2010-11-05 DIAGNOSIS — N6459 Other signs and symptoms in breast: Secondary | ICD-10-CM

## 2010-11-05 DIAGNOSIS — I10 Essential (primary) hypertension: Secondary | ICD-10-CM | POA: Insufficient documentation

## 2010-11-05 DIAGNOSIS — I4891 Unspecified atrial fibrillation: Secondary | ICD-10-CM | POA: Insufficient documentation

## 2010-11-05 DIAGNOSIS — N6049 Mammary duct ectasia of unspecified breast: Secondary | ICD-10-CM | POA: Insufficient documentation

## 2010-11-05 DIAGNOSIS — Z7901 Long term (current) use of anticoagulants: Secondary | ICD-10-CM | POA: Insufficient documentation

## 2010-11-05 DIAGNOSIS — Z01818 Encounter for other preprocedural examination: Secondary | ICD-10-CM | POA: Insufficient documentation

## 2010-11-05 HISTORY — PX: BREAST BIOPSY: SHX20

## 2010-11-05 LAB — CBC
HCT: 31.6 % — ABNORMAL LOW (ref 36.0–46.0)
Hemoglobin: 10.2 g/dL — ABNORMAL LOW (ref 12.0–15.0)
MCH: 31.3 pg (ref 26.0–34.0)
MCHC: 32.3 g/dL (ref 30.0–36.0)
RDW: 12.9 % (ref 11.5–15.5)

## 2010-11-05 LAB — BASIC METABOLIC PANEL
BUN: 16 mg/dL (ref 6–23)
Chloride: 100 mEq/L (ref 96–112)
Creatinine, Ser: 1 mg/dL (ref 0.50–1.10)
Glucose, Bld: 107 mg/dL — ABNORMAL HIGH (ref 70–99)
Potassium: 4.3 mEq/L (ref 3.5–5.1)

## 2010-11-05 LAB — PROTIME-INR: INR: 1.11 (ref 0.00–1.49)

## 2010-11-08 NOTE — Op Note (Signed)
  [name], [name]          ACCOUNT NO.:  [id number]  MEDICAL RECORD NO.:  [id number]  LOCATION:                               FACILITY:  [facility]  PHYSICIAN:  Adolph Pollack, M.D.DATE OF BIRTH:  1923-04-21  DATE OF PROCEDURE:  11/05/2010 DATE OF DISCHARGE:                              OPERATIVE REPORT   PREOPERATIVE DIAGNOSIS:  Right breast nipple discharge (bloody at times).  POSTOPERATIVE DIAGNOSIS:  Right breast nipple discharge (bloody at times).  PROCEDURE: 1. Excision of right nipple ducts. 2. Right breast biopsy.  SURGEON:  Adolph Pollack, M.D..  ANESTHESIA:  MAC with local (mixture of Xylocaine and Marcaine).  INDICATION:  Lindsey [name] is an 77 year old female who has a history of right nipple discharge, intermittently bloody.  Ductogram was unremarkable.  Ultrasound demonstrated ductal ectasia.  This is very irritating and concerning to her and she would like to have something done about it and thus, she presents for the above procedure.  Her Coumadin has been held.  TECHNIQUE:  She was seen in the holding area and the right breast marked with my initials.  She was then brought to the operating room, placed supine on the operating table, and given intravenous sedation.  I was able to express some discharge from the central aspect of that duct just slightly superior to the central area.  The right breast were then sterilely prepped and draped.  Local anesthetic was infiltrated in the circumareolar region, superficially and deep.  From the 9 o'clock to 3 o'clock position, a circumareolar incision was made through the skin and dermis.  Using sharp dissection, I then excised ducts from the nipple and let them fall away back into the breast tissue.  I then grasped this breast tissue with an Allis clamp, and using electrocautery I performed a biopsy of the underlying breast tissue and the specimen also included the ducts that had been excised.  I  manipulated the nipple and no further discharge was noted.  The specimen was oriented with sutures.  Following this, bleeding was controlled with electrocautery.  Once hemostasis was adequate, I closed the subcutaneous tissue with interrupted 3-0 Vicryl sutures.  The skin was closed with a 4-0 Monocryl subcuticular stitch.  Steri-Strips and sterile dressing were applied.  She tolerated the procedure without any apparent complications and was taken to the recovery room in satisfactory condition.     Adolph Pollack, M.D.     Kari Baars  D:  11/05/2010  T:  11/05/2010  Job:  [id number]  cc:   Malachi Pro. Ambrose Mantle, M.D. Fax: [phone]  Noralyn Pick. Eden Emms, MD, [facility] [address]  [address] [place] [address]  Electronically Signed by Avel Peace M.D. on 11/08/2010 10:16:19 AM

## 2010-11-18 ENCOUNTER — Other Ambulatory Visit: Payer: Self-pay | Admitting: Cardiovascular Disease

## 2010-11-19 ENCOUNTER — Encounter (INDEPENDENT_AMBULATORY_CARE_PROVIDER_SITE_OTHER): Payer: Self-pay

## 2010-11-19 ENCOUNTER — Other Ambulatory Visit: Payer: Self-pay | Admitting: Cardiovascular Disease

## 2010-11-19 ENCOUNTER — Ambulatory Visit (INDEPENDENT_AMBULATORY_CARE_PROVIDER_SITE_OTHER): Payer: Medicare Other | Admitting: *Deleted

## 2010-11-19 DIAGNOSIS — Z7901 Long term (current) use of anticoagulants: Secondary | ICD-10-CM

## 2010-11-19 DIAGNOSIS — I4891 Unspecified atrial fibrillation: Secondary | ICD-10-CM

## 2010-11-19 LAB — POCT INR: INR: 1.5

## 2010-11-19 MED ORDER — WARFARIN SODIUM 5 MG PO TABS: ORAL_TABLET | ORAL | Status: DC

## 2010-11-20 ENCOUNTER — Ambulatory Visit (INDEPENDENT_AMBULATORY_CARE_PROVIDER_SITE_OTHER): Payer: Medicare Other | Admitting: General Surgery

## 2010-11-20 ENCOUNTER — Encounter (INDEPENDENT_AMBULATORY_CARE_PROVIDER_SITE_OTHER): Payer: Self-pay | Admitting: General Surgery

## 2010-11-20 VITALS — BP 148/86 | HR 66 | Temp 97.1°F | Resp 14 | Ht 65.0 in | Wt 190.4 lb

## 2010-11-20 DIAGNOSIS — N6459 Other signs and symptoms in breast: Secondary | ICD-10-CM

## 2010-11-20 DIAGNOSIS — N6019 Diffuse cystic mastopathy of unspecified breast: Secondary | ICD-10-CM

## 2010-11-20 NOTE — Patient Instructions (Signed)
Wear a nonstick pad on wound.

## 2010-11-20 NOTE — Progress Notes (Signed)
Operation:  Excision of right nipple ducts and breast biopsy  Date:  11/05/10  Pathology:  Fibrocystic change and duct ectasia.  No malignancy.  HPI:  She is here for her first postop visit.  She has had a little bleeding from the wound.   Physical Exam:  Right breast- wound is clean with some swelling and ecchymosis; no drainage.   Assessment:  Wound okay and path benign.  Plan:  Keep a dry dressing on wound.  Return visit in one month.

## 2010-11-29 ENCOUNTER — Encounter: Payer: Self-pay | Admitting: Internal Medicine

## 2010-11-30 ENCOUNTER — Ambulatory Visit (INDEPENDENT_AMBULATORY_CARE_PROVIDER_SITE_OTHER): Payer: Medicare Other | Admitting: Internal Medicine

## 2010-11-30 ENCOUNTER — Encounter: Payer: Self-pay | Admitting: Internal Medicine

## 2010-11-30 VITALS — BP 124/76 | HR 63 | Resp 12 | Ht 65.5 in | Wt 190.0 lb

## 2010-11-30 DIAGNOSIS — E559 Vitamin D deficiency, unspecified: Secondary | ICD-10-CM

## 2010-11-30 DIAGNOSIS — R5381 Other malaise: Secondary | ICD-10-CM

## 2010-11-30 DIAGNOSIS — Z23 Encounter for immunization: Secondary | ICD-10-CM

## 2010-11-30 DIAGNOSIS — R7309 Other abnormal glucose: Secondary | ICD-10-CM

## 2010-11-30 DIAGNOSIS — M810 Age-related osteoporosis without current pathological fracture: Secondary | ICD-10-CM

## 2010-11-30 DIAGNOSIS — R5383 Other fatigue: Secondary | ICD-10-CM

## 2010-11-30 DIAGNOSIS — Z0000 Encounter for general adult medical examination without abnormal findings: Secondary | ICD-10-CM

## 2010-11-30 DIAGNOSIS — I10 Essential (primary) hypertension: Secondary | ICD-10-CM

## 2010-11-30 DIAGNOSIS — D649 Anemia, unspecified: Secondary | ICD-10-CM

## 2010-11-30 DIAGNOSIS — E538 Deficiency of other specified B group vitamins: Secondary | ICD-10-CM

## 2010-11-30 LAB — CBC WITH DIFFERENTIAL/PLATELET
Basophils Absolute: 0 10*3/uL (ref 0.0–0.1)
Basophils Relative: 0 % (ref 0–1)
Lymphocytes Relative: 28 % (ref 12–46)
MCHC: 32.3 g/dL (ref 30.0–36.0)
Monocytes Absolute: 0.7 10*3/uL (ref 0.1–1.0)
Neutro Abs: 3.4 10*3/uL (ref 1.7–7.7)
Neutrophils Relative %: 55 % (ref 43–77)
Platelets: 278 10*3/uL (ref 150–400)
RDW: 13.4 % (ref 11.5–15.5)
WBC: 6.2 10*3/uL (ref 4.0–10.5)

## 2010-11-30 LAB — HEMOGLOBIN A1C
Hgb A1c MFr Bld: 5.6 % (ref <5.7)
Mean Plasma Glucose: 114 mg/dL (ref <117)

## 2010-11-30 MED ORDER — POLYETHYLENE GLYCOL 3350 17 GM/SCOOP PO POWD: 17.0000 g | ORAL | Status: DC | PRN

## 2010-11-30 MED ORDER — PANTOPRAZOLE SODIUM 40 MG PO TBEC: DELAYED_RELEASE_TABLET | ORAL | Status: DC

## 2010-11-30 NOTE — Patient Instructions (Signed)
.  Share results with all MDs seen  

## 2010-11-30 NOTE — Progress Notes (Signed)
Subjective:    Patient ID: Lindsey [name], female    DOB: Oct 04, 1923, 77 y.o.   MRN: [id number]  HPI Medicare Wellness Visit:  The following psychosocial & medical history were reviewed as required by Medicare.   Social history: caffeine: tea 2 glasses / day , alcohol:  no ,  tobacco use : never  & exercise : no.   Home & personal  safety / fall risk: walks with cane ( single or 4 pronged), activities of daily living: no help needed, daughter helps with home cleaning , seatbelt use : yes , and smoke alarm employment : yes .  Power of Attorney/Living Will status : in place  Vision ( as recorded per Nurse) & Hearing  evaluation :  Ophth appt every 6 mos ; S/P cataract surgery. Audiologist monitors hearing aids Orientation :oriented X 3 , memory & recall : 3of 3 recalled ,  math testing: good,and mood & affect : normal . Depression / anxiety: depressed @ times Travel history : [place] in 1980s , immunization status :? Shingles needed , transfusion history:  Yes post THR, and preventive health surveillance ( colonoscopies, BMD , etc as per protocol/ [facility]): last endoscopy 2008, Dental care:  Every 6 mos . Chart reviewed &  Updated. Active issues reviewed & addressed.       Review of Systems  HYPERTENSION: Disease Monitoring  Blood pressure range: not monitored  Chest pain: occasionally from ERD post spicey foods   Dyspnea: yes, only when rushed   Claudication: no   Medication compliance: yes  Medication Side Effects  Lightheadedness: no   Urinary frequency: no   Edema: yes, chronically    Preventitive Healthcare:  Diet Pattern: no plan except avoidance of greens due to warfarin  Salt Restriction: no added salt  GERD: She denies significant abdominal pain, dysphagia, rectal bleeding, or melena. She has been anemic in the past. Med compliance: Protonix each am       Objective:   Physical Exam Gen.: well-nourished in appearance. Appropriate and cooperative throughout  exam. Eyes: No corneal or conjunctival inflammation noted. Arcus senilis Mouth: Oral mucosa and oropharynx reveal no lesions or exudates. Teeth in good repair. Neck: No deformities, masses, or tenderness noted.  Thyroid no nodules palpated. Lungs: Normal respiratory effort; chest expands symmetrically. Lungs are clear to auscultation without rales, wheezes, or increased work of breathing. Heart: Irregualr rate and rhythm. Normal S1 and S2. No gallop, click, or rub. Intermittent flow murmur. Abdomen: Bowel sounds normal; abdomen soft and nontender. No masses, organomegaly or hernias noted.                                                                                  Musculoskeletal/extremities: Lordosis noted of  the thoracic spine. No clubbing, cyanosis noted. .Joints : DJD finger changes. Nail health  Good. 1/2- 1 + edema to mid shin Vascular: Carotid, radial artery, dorsalis pedis and  posterior tibial pulses are full and equal. No bruits present.Pedal pulses decreased by edema Neurologic: Alert and oriented x3.           Skin: Intact without suspicious lesions or rashes. Venous spiders of legs Lymph: No cervical, axillary lymphadenopathy  present. Psych: Mood and affect are normal. Normally interactive                                                                                         Assessment & Plan:  #1 Medicare Wellness Exam; criteria met ; data entered #2 Problem List reviewed ; Assessment/ Recommendations made Plan: see Orders

## 2010-11-30 NOTE — Progress Notes (Signed)
Addended by: Legrand Como on: 11/30/2010 03:11 PM   Modules accepted: Orders

## 2010-11-30 NOTE — Progress Notes (Signed)
Addended by: Edgardo Roys on: 11/30/2010 03:09 PM   Modules accepted: Orders

## 2010-12-01 LAB — VITAMIN D 25 HYDROXY (VIT D DEFICIENCY, FRACTURES): Vit D, 25-Hydroxy: 36 ng/mL (ref 30–89)

## 2010-12-01 LAB — TSH: TSH: 3.029 u[IU]/mL (ref 0.350–4.500)

## 2010-12-01 LAB — IBC PANEL: TIBC: 318 ug/dL (ref 250–470)

## 2010-12-03 ENCOUNTER — Ambulatory Visit (INDEPENDENT_AMBULATORY_CARE_PROVIDER_SITE_OTHER): Payer: Medicare Other | Admitting: *Deleted

## 2010-12-03 DIAGNOSIS — I4891 Unspecified atrial fibrillation: Secondary | ICD-10-CM

## 2010-12-03 DIAGNOSIS — Z7901 Long term (current) use of anticoagulants: Secondary | ICD-10-CM

## 2010-12-03 LAB — POCT INR: INR: 3.6

## 2010-12-12 ENCOUNTER — Encounter (INDEPENDENT_AMBULATORY_CARE_PROVIDER_SITE_OTHER): Payer: Self-pay | Admitting: General Surgery

## 2010-12-17 ENCOUNTER — Ambulatory Visit (INDEPENDENT_AMBULATORY_CARE_PROVIDER_SITE_OTHER): Payer: Medicare Other | Admitting: *Deleted

## 2010-12-17 DIAGNOSIS — Z7901 Long term (current) use of anticoagulants: Secondary | ICD-10-CM

## 2010-12-17 DIAGNOSIS — I4891 Unspecified atrial fibrillation: Secondary | ICD-10-CM

## 2010-12-18 ENCOUNTER — Encounter (INDEPENDENT_AMBULATORY_CARE_PROVIDER_SITE_OTHER): Payer: Self-pay | Admitting: General Surgery

## 2010-12-18 ENCOUNTER — Ambulatory Visit (INDEPENDENT_AMBULATORY_CARE_PROVIDER_SITE_OTHER): Payer: Medicare Other | Admitting: General Surgery

## 2010-12-18 VITALS — BP 110/82 | HR 60 | Temp 97.6°F | Resp 12 | Ht 65.0 in | Wt 191.8 lb

## 2010-12-18 DIAGNOSIS — Z9889 Other specified postprocedural states: Secondary | ICD-10-CM

## 2010-12-18 NOTE — Progress Notes (Signed)
Operation:  Excision of right nipple ducts and breast biopsy  Date:  11/05/10  Pathology:  Fibrocystic change and duct ectasia.  No malignancy.  HPI:  She is here for her second  postop visit.  No drainage from the wound; she has minimal discomfort.   Physical Exam:  Right breast- wound is clean, dry, and intact.   Assessment:  Wound healing well.  Plan:  Return visit prn.

## 2011-01-17 ENCOUNTER — Ambulatory Visit (INDEPENDENT_AMBULATORY_CARE_PROVIDER_SITE_OTHER): Payer: Medicare Other | Admitting: *Deleted

## 2011-01-17 DIAGNOSIS — I4891 Unspecified atrial fibrillation: Secondary | ICD-10-CM

## 2011-01-17 DIAGNOSIS — Z7901 Long term (current) use of anticoagulants: Secondary | ICD-10-CM

## 2011-02-12 ENCOUNTER — Other Ambulatory Visit: Payer: Self-pay | Admitting: Internal Medicine

## 2011-02-18 ENCOUNTER — Ambulatory Visit (INDEPENDENT_AMBULATORY_CARE_PROVIDER_SITE_OTHER): Payer: Medicare Other | Admitting: Pharmacist

## 2011-02-18 DIAGNOSIS — I4891 Unspecified atrial fibrillation: Secondary | ICD-10-CM

## 2011-02-18 DIAGNOSIS — Z7901 Long term (current) use of anticoagulants: Secondary | ICD-10-CM

## 2011-02-18 LAB — POCT INR: INR: 2.4

## 2011-03-18 ENCOUNTER — Ambulatory Visit (INDEPENDENT_AMBULATORY_CARE_PROVIDER_SITE_OTHER): Payer: Medicare Other | Admitting: *Deleted

## 2011-03-18 DIAGNOSIS — Z7901 Long term (current) use of anticoagulants: Secondary | ICD-10-CM

## 2011-03-18 DIAGNOSIS — I4891 Unspecified atrial fibrillation: Secondary | ICD-10-CM

## 2011-04-01 ENCOUNTER — Ambulatory Visit (INDEPENDENT_AMBULATORY_CARE_PROVIDER_SITE_OTHER): Payer: Medicare Other | Admitting: Cardiovascular Disease

## 2011-04-01 DIAGNOSIS — Z7901 Long term (current) use of anticoagulants: Secondary | ICD-10-CM

## 2011-04-01 DIAGNOSIS — I10 Essential (primary) hypertension: Secondary | ICD-10-CM

## 2011-04-01 DIAGNOSIS — I4891 Unspecified atrial fibrillation: Secondary | ICD-10-CM

## 2011-04-01 DIAGNOSIS — Z8679 Personal history of other diseases of the circulatory system: Secondary | ICD-10-CM

## 2011-04-01 NOTE — Assessment & Plan Note (Signed)
Aldactone.  Low sodium  Peripheral varicosities unchanged

## 2011-04-01 NOTE — Assessment & Plan Note (Signed)
Well controlled.  Continue current medications and low sodium Dash type diet.    

## 2011-04-01 NOTE — Assessment & Plan Note (Signed)
Good rate control and anticoagulation.  May need to stop or cut back beta blocker in future as she gets older.  Pulse was also in mid 70's during exam so I dont think she is too slow currently

## 2011-04-01 NOTE — Patient Instructions (Signed)
Your physician wants you to follow-up in:  6 MONTHS WITH DR NISHAN  You will receive a reminder letter in the mail two months in advance. If you don't receive a letter, please call our office to schedule the follow-up appointment. Your physician recommends that you continue on your current medications as directed. Please refer to the Current Medication list given to you today. 

## 2011-04-01 NOTE — Progress Notes (Signed)
Lindsey [name] is seen today for f/U for chronic afib. Despite her age she is a good coumadin candidate. She drove herself to the office today and walks well with a cane. She has not had any palpitations, PND or orthopne, TIA or syncope. She has chronic LE edema from varicosities and this is stable on aldactone.  ROS: Denies fever, malais, weight loss, blurry vision, decreased visual acuity, cough, sputum, SOB, hemoptysis, pleuritic pain, palpitaitons, heartburn, abdominal pain, melena, lower extremity edema, claudication, or rash.  All other systems reviewed and negative  General: Affect appropriate Healthy:  appears stated age HEENT: normal Neck supple with no adenopathy JVP normal no bruits no thyromegaly Lungs clear with no wheezing and good diaphragmatic motion Heart:  S1/S2 no murmur, no rub, gallop or click PMI normal Abdomen: benighn, BS positve, no tenderness, no AAA no bruit.  No HSM or HJR Distal pulses intact with no bruits Plus one edema with superficial varicosities Neuro non-focal Skin warm and dry No muscular weakness   Current Outpatient Prescriptions  Medication Sig Dispense Refill  . Calcium Carbonate-Vitamin D (CALTRATE 600+D) 600-400 MG-UNIT per tablet Take 1 tablet by mouth 2 (two) times daily.        . Cholecalciferol (VITAMIN D3) 2000 UNITS TABS Take 1 tablet by mouth daily.        . diazepam (VALIUM) 5 MG tablet Take 5 mg by mouth every 6 (six) hours as needed.        . docusate sodium (COLACE) 100 MG capsule Take 100 mg by mouth daily.        [name] estrogens, conjugated, (PREMARIN) 0.625 MG tablet Take 0.625 mg by mouth daily. Take daily for 21 days then do not take for 7 days.       . ferrous sulfate 325 (65 FE) MG tablet Take 325 mg by mouth daily with breakfast.        . gabapentin (NEURONTIN) 100 MG capsule Take 100 mg by mouth. 2 tablets at bed time       . HYDROcodone-acetaminophen (NORCO) 5-325 MG per tablet Take 1 tablet by mouth every 6 (six) hours as needed.         . lidocaine (LIDODERM) 5 % Place 1 patch onto the skin. Remove & Discard patch within 12 hours or as directed by MD       . metoprolol succinate (TOPROL-XL) 25 MG 24 hr tablet TAKE ONE TABLET BY MOUTH EVERY DAY  30 tablet  12  . Multiple Vitamin (MULTIVITAMIN) tablet Take 1 tablet by mouth daily.        . naproxen sodium (ALEVE) 220 MG tablet Take 220 mg by mouth as needed.        . pantoprazole (PROTONIX) 40 MG tablet TAKE 1 TABLET DAILY  90 tablet  3  . polyethylene glycol powder (MIRALAX) powder Take 17 g by mouth as needed.  255 g  11  . Probiotic Product (ALIGN) 4 MG CAPS Take 1 capsule by mouth daily.        [name] pyridOXINE (VITAMIN B-6) 100 MG tablet Take 100 mg by mouth daily.        [name] spironolactone (ALDACTONE) 25 MG tablet TAKE ONE TABLET BY MOUTH EVERY DAY  90 tablet  3  . trolamine salicylate (ASPERCREME/ALOE) 10 % cream Apply topically. As directred       . vitamin B-12 (CYANOCOBALAMIN) 100 MCG tablet Take 250 mcg by mouth daily.        [name] warfarin (COUMADIN) 2.5 MG  tablet TAKE ONE TABLET BY MOUTH EVERY DAY AS DIRECTED  30 tablet  3    Allergies  Haloperidol lactate; Hydromorphone hcl; Ibuprofen; Melatonin; Morphine; Nortriptyline hcl; and Pentazocine lactate  Electrocardiogram: afib rate 56 no other abnormalities  Assessment and Plan

## 2011-04-01 NOTE — Assessment & Plan Note (Signed)
No bleeding issues.  F/U coumadin clinic in 6 weeks

## 2011-04-29 ENCOUNTER — Ambulatory Visit (INDEPENDENT_AMBULATORY_CARE_PROVIDER_SITE_OTHER): Payer: Medicare Other | Admitting: *Deleted

## 2011-04-29 DIAGNOSIS — Z7901 Long term (current) use of anticoagulants: Secondary | ICD-10-CM

## 2011-04-29 DIAGNOSIS — I4891 Unspecified atrial fibrillation: Secondary | ICD-10-CM

## 2011-05-27 ENCOUNTER — Ambulatory Visit (INDEPENDENT_AMBULATORY_CARE_PROVIDER_SITE_OTHER): Payer: Medicare Other | Admitting: *Deleted

## 2011-05-27 DIAGNOSIS — Z7901 Long term (current) use of anticoagulants: Secondary | ICD-10-CM

## 2011-05-27 DIAGNOSIS — I4891 Unspecified atrial fibrillation: Secondary | ICD-10-CM

## 2011-06-24 ENCOUNTER — Ambulatory Visit (INDEPENDENT_AMBULATORY_CARE_PROVIDER_SITE_OTHER): Payer: Medicare Other | Admitting: *Deleted

## 2011-06-24 DIAGNOSIS — Z7901 Long term (current) use of anticoagulants: Secondary | ICD-10-CM

## 2011-06-24 DIAGNOSIS — I4891 Unspecified atrial fibrillation: Secondary | ICD-10-CM

## 2011-07-22 ENCOUNTER — Ambulatory Visit (INDEPENDENT_AMBULATORY_CARE_PROVIDER_SITE_OTHER): Payer: Medicare Other | Admitting: *Deleted

## 2011-07-22 DIAGNOSIS — Z7901 Long term (current) use of anticoagulants: Secondary | ICD-10-CM

## 2011-07-22 DIAGNOSIS — I4891 Unspecified atrial fibrillation: Secondary | ICD-10-CM

## 2011-07-22 LAB — POCT INR: INR: 2.5

## 2011-07-31 ENCOUNTER — Ambulatory Visit
Admission: RE | Admit: 2011-07-31 | Discharge: 2011-07-31 | Disposition: A | Discharge status: Home / Self Care | Payer: Medicare Other | Source: Ambulatory Visit | Attending: Anesthesiology | Admitting: Anesthesiology

## 2011-07-31 ENCOUNTER — Other Ambulatory Visit: Payer: Self-pay | Admitting: Anesthesiology

## 2011-07-31 DIAGNOSIS — M25569 Pain in unspecified knee: Secondary | ICD-10-CM

## 2011-08-29 ENCOUNTER — Other Ambulatory Visit: Payer: Self-pay | Admitting: Cardiovascular Disease

## 2011-09-02 ENCOUNTER — Ambulatory Visit (INDEPENDENT_AMBULATORY_CARE_PROVIDER_SITE_OTHER): Payer: Medicare Other

## 2011-09-02 DIAGNOSIS — I4891 Unspecified atrial fibrillation: Secondary | ICD-10-CM

## 2011-09-02 DIAGNOSIS — Z7901 Long term (current) use of anticoagulants: Secondary | ICD-10-CM

## 2011-09-02 LAB — POCT INR: INR: 2.6

## 2011-10-14 ENCOUNTER — Ambulatory Visit (INDEPENDENT_AMBULATORY_CARE_PROVIDER_SITE_OTHER): Payer: Medicare Other | Admitting: *Deleted

## 2011-10-14 DIAGNOSIS — Z7901 Long term (current) use of anticoagulants: Secondary | ICD-10-CM

## 2011-10-14 DIAGNOSIS — I4891 Unspecified atrial fibrillation: Secondary | ICD-10-CM

## 2011-11-12 ENCOUNTER — Ambulatory Visit (INDEPENDENT_AMBULATORY_CARE_PROVIDER_SITE_OTHER): Payer: Medicare Other | Admitting: *Deleted

## 2011-11-12 DIAGNOSIS — Z7901 Long term (current) use of anticoagulants: Secondary | ICD-10-CM

## 2011-11-12 DIAGNOSIS — I4891 Unspecified atrial fibrillation: Secondary | ICD-10-CM

## 2011-11-14 ENCOUNTER — Observation Stay (HOSPITAL_COMMUNITY)
Admission: EM | Admit: 2011-11-14 | Discharge: 2011-11-15 | Disposition: A | Discharge status: Home / Self Care | Payer: Medicare Other | Attending: Cardiology | Admitting: Cardiology

## 2011-11-14 ENCOUNTER — Encounter (HOSPITAL_COMMUNITY): Payer: Self-pay | Admitting: Nurse Practitioner

## 2011-11-14 ENCOUNTER — Emergency Department (HOSPITAL_COMMUNITY): Payer: Medicare Other

## 2011-11-14 DIAGNOSIS — M810 Age-related osteoporosis without current pathological fracture: Secondary | ICD-10-CM | POA: Diagnosis present

## 2011-11-14 DIAGNOSIS — R0602 Shortness of breath: Secondary | ICD-10-CM | POA: Insufficient documentation

## 2011-11-14 DIAGNOSIS — I872 Venous insufficiency (chronic) (peripheral): Secondary | ICD-10-CM

## 2011-11-14 DIAGNOSIS — R61 Generalized hyperhidrosis: Secondary | ICD-10-CM | POA: Insufficient documentation

## 2011-11-14 DIAGNOSIS — I999 Unspecified disorder of circulatory system: Secondary | ICD-10-CM | POA: Insufficient documentation

## 2011-11-14 DIAGNOSIS — E559 Vitamin D deficiency, unspecified: Secondary | ICD-10-CM | POA: Insufficient documentation

## 2011-11-14 DIAGNOSIS — R0789 Other chest pain: Principal | ICD-10-CM | POA: Insufficient documentation

## 2011-11-14 DIAGNOSIS — Z7901 Long term (current) use of anticoagulants: Secondary | ICD-10-CM | POA: Insufficient documentation

## 2011-11-14 DIAGNOSIS — I482 Chronic atrial fibrillation, unspecified: Secondary | ICD-10-CM | POA: Diagnosis present

## 2011-11-14 DIAGNOSIS — K219 Gastro-esophageal reflux disease without esophagitis: Secondary | ICD-10-CM | POA: Insufficient documentation

## 2011-11-14 DIAGNOSIS — I4891 Unspecified atrial fibrillation: Secondary | ICD-10-CM | POA: Insufficient documentation

## 2011-11-14 DIAGNOSIS — D649 Anemia, unspecified: Secondary | ICD-10-CM | POA: Diagnosis present

## 2011-11-14 DIAGNOSIS — I10 Essential (primary) hypertension: Secondary | ICD-10-CM | POA: Insufficient documentation

## 2011-11-14 DIAGNOSIS — I359 Nonrheumatic aortic valve disorder, unspecified: Secondary | ICD-10-CM | POA: Insufficient documentation

## 2011-11-14 HISTORY — DX: Other specified disorders of veins: I87.8

## 2011-11-14 HISTORY — DX: Syncope and collapse: R55

## 2011-11-14 LAB — CBC WITH DIFFERENTIAL/PLATELET
Basophils Absolute: 0 10*3/uL (ref 0.0–0.1)
Eosinophils Absolute: 0.2 10*3/uL (ref 0.0–0.7)
Eosinophils Relative: 4 % (ref 0–5)
MCH: 31.1 pg (ref 26.0–34.0)
MCHC: 32.2 g/dL (ref 30.0–36.0)
MCV: 96.5 fL (ref 78.0–100.0)
Platelets: 248 10*3/uL (ref 150–400)
RDW: 13.1 % (ref 11.5–15.5)
WBC: 6 10*3/uL (ref 4.0–10.5)

## 2011-11-14 LAB — PROTIME-INR
INR: 2.16 — ABNORMAL HIGH (ref 0.00–1.49)
Prothrombin Time: 23.2 seconds — ABNORMAL HIGH (ref 11.6–15.2)
Prothrombin Time: 22.1 seconds — ABNORMAL HIGH (ref 11.6–15.2)

## 2011-11-14 LAB — BASIC METABOLIC PANEL
Calcium: 9.2 mg/dL (ref 8.4–10.5)
Creatinine, Ser: 1.09 mg/dL (ref 0.50–1.10)
GFR calc non Af Amer: 45 mL/min — ABNORMAL LOW (ref >90)
Sodium: 134 mEq/L — ABNORMAL LOW (ref 135–145)

## 2011-11-14 LAB — PRO B NATRIURETIC PEPTIDE: Pro B Natriuretic peptide (BNP): 2508 pg/mL — ABNORMAL HIGH (ref 0–450)

## 2011-11-14 MED ORDER — VITAMIN B-6 100 MG PO TABS
100.0000 mg | ORAL_TABLET | Freq: Every day | ORAL | Status: DC
  Administered 2011-11-15: 100 mg via ORAL
  Filled 2011-11-14 (×2): qty 1

## 2011-11-14 MED ORDER — METOPROLOL SUCCINATE 12.5 MG HALF TABLET
12.5000 mg | ORAL_TABLET | Freq: Every day | ORAL | Status: DC
  Administered 2011-11-15: 12.5 mg via ORAL
  Filled 2011-11-14: qty 1

## 2011-11-14 MED ORDER — FERROUS SULFATE 325 (65 FE) MG PO TABS
325.0000 mg | ORAL_TABLET | Freq: Every day | ORAL | Status: DC
  Administered 2011-11-15: 325 mg via ORAL
  Filled 2011-11-14 (×2): qty 1

## 2011-11-14 MED ORDER — ESTROGENS CONJUGATED 0.625 MG PO TABS
0.6250 mg | ORAL_TABLET | ORAL | Status: DC
  Filled 2011-11-14: qty 1

## 2011-11-14 MED ORDER — METOPROLOL SUCCINATE 12.5 MG HALF TABLET
12.5000 mg | ORAL_TABLET | Freq: Every day | ORAL | Status: DC
  Filled 2011-11-14 (×2): qty 1

## 2011-11-14 MED ORDER — DOCUSATE SODIUM 100 MG PO CAPS
100.0000 mg | ORAL_CAPSULE | Freq: Every day | ORAL | Status: DC
  Administered 2011-11-15: 100 mg via ORAL
  Filled 2011-11-14 (×2): qty 1

## 2011-11-14 MED ORDER — ALIGN 4 MG PO CAPS: 1.0000 | ORAL_CAPSULE | Freq: Every day | ORAL | Status: DC

## 2011-11-14 MED ORDER — POLYETHYLENE GLYCOL 3350 17 G PO PACK
17.0000 g | PACK | ORAL | Status: DC | PRN
  Filled 2011-11-14: qty 1

## 2011-11-14 MED ORDER — WARFARIN 1.25 MG HALF TABLET
1.2500 mg | ORAL_TABLET | ORAL | Status: DC
  Filled 2011-11-14: qty 1

## 2011-11-14 MED ORDER — PANTOPRAZOLE SODIUM 40 MG PO TBEC
40.0000 mg | DELAYED_RELEASE_TABLET | Freq: Every day | ORAL | Status: DC
  Administered 2011-11-14 – 2011-11-15 (×2): 40 mg via ORAL
  Filled 2011-11-14 (×2): qty 1

## 2011-11-14 MED ORDER — NITROGLYCERIN 0.4 MG SL SUBL: 0.4000 mg | SUBLINGUAL_TABLET | SUBLINGUAL | Status: DC | PRN

## 2011-11-14 MED ORDER — CYANOCOBALAMIN 250 MCG PO TABS
250.0000 ug | ORAL_TABLET | Freq: Every day | ORAL | Status: DC
  Administered 2011-11-15: 250 ug via ORAL
  Filled 2011-11-14 (×2): qty 1

## 2011-11-14 MED ORDER — FUROSEMIDE 10 MG/ML IJ SOLN
40.0000 mg | Freq: Two times a day (BID) | INTRAMUSCULAR | Status: DC
  Administered 2011-11-14 – 2011-11-15 (×2): 40 mg via INTRAVENOUS
  Filled 2011-11-14 (×3): qty 4

## 2011-11-14 MED ORDER — ESTROGENS CONJUGATED 0.625 MG PO TABS
0.6250 mg | ORAL_TABLET | ORAL | Status: DC
  Administered 2011-11-15: 0.625 mg via ORAL
  Filled 2011-11-14: qty 1

## 2011-11-14 MED ORDER — WARFARIN 1.25 MG HALF TABLET
1.2500 mg | ORAL_TABLET | Freq: Once | ORAL | Status: AC
  Administered 2011-11-14: 1.25 mg via ORAL
  Filled 2011-11-14: qty 1

## 2011-11-14 MED ORDER — VITAMIN D3 25 MCG (1000 UNIT) PO TABS
2000.0000 [IU] | ORAL_TABLET | Freq: Every day | ORAL | Status: DC
  Administered 2011-11-15: 2000 [IU] via ORAL
  Filled 2011-11-14 (×2): qty 2

## 2011-11-14 MED ORDER — NITROGLYCERIN 2 % TD OINT
0.5000 [in_us] | TOPICAL_OINTMENT | Freq: Four times a day (QID) | TRANSDERMAL | Status: DC
  Administered 2011-11-14 – 2011-11-15 (×2): 0.5 [in_us] via TOPICAL
  Filled 2011-11-14: qty 30

## 2011-11-14 MED ORDER — SPIRONOLACTONE 25 MG PO TABS
25.0000 mg | ORAL_TABLET | Freq: Every day | ORAL | Status: DC
  Administered 2011-11-15: 25 mg via ORAL
  Filled 2011-11-14 (×2): qty 1

## 2011-11-14 MED ORDER — ONDANSETRON HCL 4 MG/2ML IJ SOLN: 4.0000 mg | Freq: Four times a day (QID) | INTRAMUSCULAR | Status: DC | PRN

## 2011-11-14 MED ORDER — ACETAMINOPHEN 325 MG PO TABS
650.0000 mg | ORAL_TABLET | ORAL | Status: DC | PRN
  Administered 2011-11-15: 650 mg via ORAL
  Filled 2011-11-14: qty 2

## 2011-11-14 MED ORDER — ALUM & MAG HYDROXIDE-SIMETH 200-200-20 MG/5ML PO SUSP
30.0000 mL | ORAL | Status: DC | PRN
  Administered 2011-11-14: 30 mL via ORAL
  Filled 2011-11-14: qty 30

## 2011-11-14 MED ORDER — ASPIRIN 300 MG RE SUPP: 300.0000 mg | RECTAL | Status: AC

## 2011-11-14 MED ORDER — HYDROCODONE-ACETAMINOPHEN 5-325 MG PO TABS
1.0000 | ORAL_TABLET | Freq: Once | ORAL | Status: AC
  Administered 2011-11-14: 1 via ORAL
  Filled 2011-11-14: qty 1

## 2011-11-14 MED ORDER — ASPIRIN 81 MG PO CHEW
324.0000 mg | CHEWABLE_TABLET | ORAL | Status: AC
  Administered 2011-11-14: 324 mg via ORAL
  Filled 2011-11-14: qty 4

## 2011-11-14 MED ORDER — ONE-DAILY MULTI VITAMINS PO TABS: 1.0000 | ORAL_TABLET | Freq: Every day | ORAL | Status: DC

## 2011-11-14 MED ORDER — RISAQUAD PO CAPS
1.0000 | ORAL_CAPSULE | Freq: Every day | ORAL | Status: DC
  Administered 2011-11-15: 1 via ORAL
  Filled 2011-11-14 (×2): qty 1

## 2011-11-14 MED ORDER — CALCIUM CARBONATE-VITAMIN D 500-200 MG-UNIT PO TABS
1.0000 | ORAL_TABLET | Freq: Two times a day (BID) | ORAL | Status: DC
  Administered 2011-11-15: 1 via ORAL
  Filled 2011-11-14 (×3): qty 1

## 2011-11-14 MED ORDER — WARFARIN SODIUM 2.5 MG PO TABS: 2.5000 mg | ORAL_TABLET | ORAL | Status: DC

## 2011-11-14 MED ORDER — WARFARIN - PHARMACIST DOSING INPATIENT: Freq: Every day | Status: DC

## 2011-11-14 MED ORDER — VITAMIN D3 50 MCG (2000 UT) PO TABS: 1.0000 | ORAL_TABLET | Freq: Every day | ORAL | Status: DC

## 2011-11-14 MED ORDER — CALCIUM CARBONATE-VITAMIN D 600-400 MG-UNIT PO TABS: 1.0000 | ORAL_TABLET | Freq: Two times a day (BID) | ORAL | Status: DC

## 2011-11-14 MED ORDER — HYDROCODONE-ACETAMINOPHEN 5-325 MG PO TABS
1.0000 | ORAL_TABLET | Freq: Four times a day (QID) | ORAL | Status: DC | PRN
Start: 2011-11-14 — End: 2011-11-15
  Administered 2011-11-14 – 2011-11-15 (×4): 1 via ORAL
  Filled 2011-11-14 (×4): qty 1

## 2011-11-14 MED ORDER — DIAZEPAM 5 MG PO TABS
5.0000 mg | ORAL_TABLET | Freq: Four times a day (QID) | ORAL | Status: DC | PRN
  Administered 2011-11-14 – 2011-11-15 (×2): 5 mg via ORAL
  Filled 2011-11-14 (×2): qty 1

## 2011-11-14 MED ORDER — GABAPENTIN 100 MG PO CAPS
200.0000 mg | ORAL_CAPSULE | Freq: Every day | ORAL | Status: DC
  Administered 2011-11-15: 200 mg via ORAL
  Filled 2011-11-14 (×2): qty 2

## 2011-11-14 MED ORDER — ASPIRIN EC 81 MG PO TBEC
81.0000 mg | DELAYED_RELEASE_TABLET | Freq: Every day | ORAL | Status: DC
  Administered 2011-11-15: 81 mg via ORAL
  Filled 2011-11-14: qty 1

## 2011-11-14 MED ORDER — POLYETHYLENE GLYCOL 3350 17 GM/SCOOP PO POWD: 17.0000 g | ORAL | Status: DC | PRN

## 2011-11-14 MED ORDER — ADULT MULTIVITAMIN W/MINERALS CH
1.0000 | ORAL_TABLET | Freq: Every day | ORAL | Status: DC
  Administered 2011-11-15: 1 via ORAL
  Filled 2011-11-14 (×2): qty 1

## 2011-11-14 NOTE — Progress Notes (Signed)
ANTICOAGULATION CONSULT NOTE - Initial Consult  Pharmacy Consult for Coumadin Indication: atrial fibrillation  Allergies  Allergen Reactions  . Haloperidol Lactate   . Hydromorphone Hcl     REACTION: SICK ON STOMACH  . Ibuprofen   . Melatonin   . Morphine   . Nortriptyline Hcl   . Pentazocine Lactate    Labs:  Basename 11/14/11 1721 11/14/11 1200 11/12/11 1533  HGB -- 10.7* --  HCT -- 33.2* --  PLT -- 248 --  APTT -- -- --  LABPROT -- 23.2* --  INR -- 2.16* 3.2  HEPARINUNFRC -- -- --  CREATININE -- 1.09 --  CKTOTAL -- -- --  CKMB -- -- --  TROPONINI <0.30 <0.30 --    The CrCl is unknown because both a height and weight (above a minimum accepted value) are required for this calculation.   Medical History: Past Medical History  Diagnosis Date  . Atrial fibrillation     a. with bradycardia - remains on bb;  b. chronic coumadin.  . Diverticulitis 2003-2008  . GERD (gastroesophageal reflux disease)   . Osteoporosis     T score -3.9 @ forearm  . Vitamin D deficiency     Vitamin D 27 in 2010  . Anemia     PMH  of  . Duodenal ulcer 1976    surgery   . 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 Despina Hick  . Hx of colonoscopy 02/2006    Internal hemorrhoids  . Nipple discharge   . Arthritis   . Hemorrhoids   . Venous stasis     a. bilat LE w/ variscosities.  [name] Hearing loss   . Syncope     a. 2003 - felt to be vasovagal;  b. 06/2001 Echo: EF 55-60%, no rwma;    Assessment: 78 year old on Coumadin PTA for Afib.  Admitted with CP and placed in observation to cycle enzymes.  Planning cath if CE are positive or ECHO abnormal  INR is therapeutic  Home dose of Coumadin = 2.5 mg on Monday, 1.25 mg other days  Goal of Therapy:  INR 2-3 Monitor platelets by anticoagulation protocol: Yes   Plan:  1) Resume home dose 2) Daily INR for now  Thank you. Okey Regal, PharmD [phone]  11/14/2011,7:11 PM

## 2011-11-14 NOTE — ED Notes (Signed)
To ED from home via EMS, pt called EMS for sudden onset substernal CP with no radiation, first responders reported pt with HR in 40s, weak, SOB, diaphoretic, on medic arrival, pt's HR was in 60s, was pain free and with no complaints, arrives with no pain, denies SOB, 18g LAC, VSS

## 2011-11-14 NOTE — ED Provider Notes (Signed)
History     CSN: [id number]  Arrival date & time 11/14/11  1119   First MD Initiated Contact with Patient 11/14/11 1138      Chief Complaint  Patient presents with  . Chest Pain    (Consider location/radiation/quality/duration/timing/severity/associated sxs/prior treatment) HPI Pt with history of atrial fibrillation but no known CAD reports she began having moderate to severe midsternal chest pain associated with diaphoresis and general weakness but no SOB, nausea or radiation earlier this morning, not relieved with Maalox. EMS was called, first responders reports HR in 40s. She states symptoms resolved spontaneously, she is asymptomatic now.  Past Medical History  Diagnosis Date  . Atrial fibrillation     with bradycardia  . Diverticulitis 2003-2008  . GERD (gastroesophageal reflux disease)   . Osteoporosis     T score -3.9 @ forearm  . Vitamin d deficiency     Vitamin D 27 in 2010  . Anemia     PMH  of  . Duodenal ulcer 1976    surgery   . 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 Despina Hick  . Hx of colonoscopy 02/2006    Internal hemorrhoids  . Nipple discharge   . Arthritis   . Hemorrhoids   . Leg swelling   . Hearing loss     Past Surgical History  Procedure Date  . Appendectomy   . Cholecystectomy   . Tonsillectomy and adenoidectomy   . Total hip arthroplasty 2010    transfusions  . Breast biopsy 11/05/10    breast lump removed- rt br  . Laparoscopic gastrotomy w/ repair of ulcer   . Abdominal hysterectomy     Family History  Problem Relation Age of Onset  . Emphysema Father 64    deceased  . Other Mother 34    deceased old age  . Diverticulosis Brother   . Heart disease Brother   . Coronary artery disease Brother     S/P CABG  . Breast cancer Sister   . Cancer Sister     breast    History  Substance Use Topics  . Smoking status: Never Smoker   . Smokeless tobacco: Never Used  . Alcohol Use: No    OB History    Grav Para Term Preterm  Abortions TAB SAB Ect Mult Living                  Review of Systems All other systems reviewed and are negative except as noted in HPI.   Allergies  Haloperidol lactate; Hydromorphone hcl; Ibuprofen; Melatonin; Morphine; Nortriptyline hcl; and Pentazocine lactate  Home Medications   Current Outpatient Rx  Name Route Sig Dispense Refill  . CALCIUM CARBONATE-VITAMIN D 600-400 MG-UNIT PO TABS Oral Take 1 tablet by mouth 2 (two) times daily.      [name] VITAMIN D3 2000 UNITS PO TABS Oral Take 1 tablet by mouth daily.      [name] DIAZEPAM 5 MG PO TABS Oral Take 5 mg by mouth every 6 (six) hours as needed.      [name] DOCUSATE SODIUM 100 MG PO CAPS Oral Take 100 mg by mouth daily.      [name] ESTROGENS CONJUGATED 0.625 MG PO TABS Oral Take 0.625 mg by mouth daily. Take daily for 21 days then do not take for 7 days.     Di Kindle SULFATE 325 (65 FE) MG PO TABS Oral Take 325 mg by mouth daily with breakfast.      . GABAPENTIN 100  MG PO CAPS Oral Take 100 mg by mouth. 2 tablets at bed time     . HYDROCODONE-ACETAMINOPHEN 5-325 MG PO TABS Oral Take 1 tablet by mouth every 6 (six) hours as needed.      [name] LIDOCAINE 5 % EX PTCH Transdermal Place 1 patch onto the skin. Remove & Discard patch within 12 hours or as directed by MD     . METOPROLOL SUCCINATE ER 25 MG PO TB24  TAKE ONE TABLET BY MOUTH EVERY DAY 30 tablet 12  . ONE-DAILY MULTI VITAMINS PO TABS Oral Take 1 tablet by mouth daily.      [name] NAPROXEN SODIUM 220 MG PO TABS Oral Take 220 mg by mouth as needed.      [name] PANTOPRAZOLE SODIUM 40 MG PO TBEC  TAKE 1 TABLET DAILY 90 tablet 3  . POLYETHYLENE GLYCOL 3350 PO POWD Oral Take 17 g by mouth as needed. 255 g 11  . ALIGN 4 MG PO CAPS Oral Take 1 capsule by mouth daily.      [name] VITAMIN B-6 100 MG PO TABS Oral Take 100 mg by mouth daily.      [name] SPIRONOLACTONE 25 MG PO TABS  TAKE ONE TABLET BY MOUTH EVERY DAY 90 tablet 3  . TROLAMINE SALICYLATE 10 % EX CREA Topical Apply topically. As directred     . VITAMIN B-12 100  MCG PO TABS Oral Take 250 mcg by mouth daily.      . WARFARIN SODIUM 2.5 MG PO TABS  Take as directed by the Anticoagulation Clinic 30 tablet 3    BP 154/61  Pulse 61  Temp 97.7 F (36.5 C) (Oral)  Resp 20  SpO2 98%  Physical Exam  Nursing note and vitals reviewed. Constitutional: She is oriented to person, place, and time. She appears well-developed and well-nourished.  HENT:  Head: Normocephalic and atraumatic.  Eyes: EOM are normal. Pupils are equal, round, and reactive to light.  Neck: Normal range of motion. Neck supple.  Cardiovascular: Intact distal pulses.        irregular and slow  Pulmonary/Chest: Effort normal and breath sounds normal.  Abdominal: Bowel sounds are normal. She exhibits no distension. There is no tenderness.  Musculoskeletal: Normal range of motion. She exhibits no edema and no tenderness.  Neurological: She is alert and oriented to person, place, and time. She has normal strength. No cranial nerve deficit or sensory deficit.  Skin: Skin is warm and dry. No rash noted.  Psychiatric: She has a normal mood and affect.    ED Course  Procedures (including critical care time)  Labs Reviewed  CBC WITH DIFFERENTIAL - Abnormal; Notable for the following:    RBC 3.44 (*)     Hemoglobin 10.7 (*)     HCT 33.2 (*)     All other components within normal limits  BASIC METABOLIC PANEL - Abnormal; Notable for the following:    Sodium 134 (*)     Glucose, Bld 115 (*)     GFR calc non Af Amer 45 (*)     GFR calc Af Amer 52 (*)     All other components within normal limits  PROTIME-INR - Abnormal; Notable for the following:    Prothrombin Time 23.2 (*)     INR 2.16 (*)     All other components within normal limits  PRO B NATRIURETIC PEPTIDE - Abnormal; Notable for the following:    Pro B Natriuretic peptide (BNP) 2508.0 (*)  All other components within normal limits  PROTIME-INR - Abnormal; Notable for the following:    Prothrombin Time 22.1 (*)     INR  2.03 (*)     All other components within normal limits  TROPONIN I  TROPONIN I  D-DIMER, QUANTITATIVE  TROPONIN I  TROPONIN I  TROPONIN I  TSH  CBC  BASIC METABOLIC PANEL  PROTIME-INR  LIPID PANEL   Dg Chest 2 View  11/14/2011  *RADIOLOGY REPORT*  Clinical Data: Chest pain, shortness of breath  CHEST - 2 VIEW  Comparison: 11/05/2010; 07/19/2009  Findings:  Grossly unchanged enlarged cardiac silhouette and mediastinal contours.  Pulmonary vasculature is less distinct on the present examination with cephalization of flow.  The lungs appear mildly hyperexpanded with flattening of bilateral hemidiaphragms.  No definite pleural effusion or pneumothorax.  Grossly unchanged bones including multilevel thoracic spine degenerative change.  Multiple surgical clips overlie the expected location of the gastric fundus.  IMPRESSION: Cardiomegaly and findings suggestive of pulmonary edema.   Original Report Authenticated By: Waynard Reeds, M.D.      1. Atrial fibrillation   2. Esophageal reflux   3. Long term current use of anticoagulant   4. Chest pain, musculoskeletal       MDM   Date: 11/14/2011  Rate: 48  Rhythm: afib with slow ventricular response  QRS Axis: normal  Intervals: normal  ST/T Wave abnormalities: normal  Conduction Disutrbances:none  Narrative Interpretation:   Old EKG Reviewed: unchanged   Pt remains pain free in the ED, to be evaluated by  Hayes Ludwig B. Bernette Mayers, MD 11/14/11 2225

## 2011-11-14 NOTE — ED Notes (Signed)
Patient transported to X-ray 

## 2011-11-14 NOTE — H&P (Signed)
CARDIOLOGY CONSULT NOTE  Patient ID: Lindsey [name] MRN: [id number], DOB/AGE: 1923/09/07   Admit date: 11/14/2011 Date of Consult: 11/14/2011  Primary Physician: Marga Melnick, MD Primary Cardiologist: P. Eden Emms, MD  Pt. Profile  78 year old female without prior h/o CAD who presented to the ED today secondary to chest pain.  Problem List  Past Medical History  Diagnosis Date  . Atrial fibrillation     a. with bradycardia - remains on bb;  b. chronic coumadin.  . Diverticulitis 2003-2008  . GERD (gastroesophageal reflux disease)   . Osteoporosis     T score -3.9 @ forearm  . Vitamin D deficiency     Vitamin D 27 in 2010  . Anemia     PMH  of  . Duodenal ulcer 1976    surgery   . 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 Despina Hick  . Hx of colonoscopy 02/2006    Internal hemorrhoids  . Nipple discharge   . Arthritis   . Hemorrhoids   . Venous stasis     a. bilat LE w/ variscosities.  [name] Hearing loss   . Syncope     a. 2003 - felt to be vasovagal;  b. 06/2001 Echo: EF 55-60%, no rwma;    Past Surgical History  Procedure Date  . Appendectomy   . Cholecystectomy   . Tonsillectomy and adenoidectomy   . Total hip arthroplasty 2010    transfusions  . Breast biopsy 11/05/10    breast lump removed- rt br  . Laparoscopic gastrotomy w/ repair of ulcer   . Abdominal hysterectomy     Allergies  Allergies  Allergen Reactions  . Haloperidol Lactate   . Hydromorphone Hcl     REACTION: SICK ON STOMACH  . Ibuprofen   . Melatonin   . Morphine   . Nortriptyline Hcl   . Pentazocine Lactate    HPI  78 year old female with the above problem list.  She has a h/o afib and is on chronic coumadin anticoagulation.  She has no prior h/o CAD and reports a normal stress test > 10 yrs ago.  She was in her USOH until this AM @ 1000 when she began to experience non-radiating 10/10 epigastric pain described as sharp and constant. This was associated with diaphoresis, but she  denies dyspnea, N, V. This pain felt similar to GERD pain except was more severe. She took Maalox without relief and contacted EMS. Upon EMS arrival symptoms remained and her HR was in the 40's.  En route to the ED, symptoms had eased off though they have recurred since arrival in the ED.  She currently c/o 2/10 substernal and epigastric pain.  She has had no further diaphoresis.  She denies lightheadedness, palpitations, headache, wkns, presyncope, syncope, or speech alterations.   Home Medications  Prior to Admission medications   Medication Sig Start Date End Date Taking? Authorizing Provider  Calcium Carbonate-Vitamin D (CALTRATE 600+D) 600-400 MG-UNIT per tablet Take 1 tablet by mouth 2 (two) times daily.     Yes Historical Provider, MD  Cholecalciferol (VITAMIN D3) 2000 UNITS TABS Take 1 tablet by mouth daily.     Yes Historical Provider, MD  diazepam (VALIUM) 5 MG tablet Take 5 mg by mouth every 6 (six) hours as needed.     Yes Historical Provider, MD  docusate sodium (COLACE) 100 MG capsule Take 100 mg by mouth daily.     Yes Historical Provider, MD  estrogens, conjugated, (PREMARIN) 0.625  MG tablet Take 0.625 mg by mouth every other day. Take daily for 21 days then do not take for 7 days.   Yes Historical Provider, MD  ferrous sulfate 325 (65 FE) MG tablet Take 325 mg by mouth daily with breakfast.     Yes Historical Provider, MD  gabapentin (NEURONTIN) 100 MG capsule Take 100 mg by mouth. 2 tablets at bed time    Yes Historical Provider, MD  HYDROcodone-acetaminophen (NORCO) 5-325 MG per tablet Take 1 tablet by mouth every 6 (six) hours as needed.     Yes Historical Provider, MD  lidocaine (LIDODERM) 5 % Place 1 patch onto the skin. Remove & Discard patch within 12 hours or as directed by MD    Yes Historical Provider, MD  metoprolol succinate (TOPROL-XL) 25 MG 24 hr tablet Take 25 mg by mouth daily.   Yes Historical Provider, MD  Multiple Vitamin (MULTIVITAMIN) tablet Take 1 tablet by  mouth daily.     Yes Historical Provider, MD  naproxen sodium (ALEVE) 220 MG tablet Take 220 mg by mouth as needed.     Yes Historical Provider, MD  pantoprazole (PROTONIX) 40 MG tablet Take 40 mg by mouth daily.   Yes Historical Provider, MD  polyethylene glycol powder (MIRALAX) powder Take 17 g by mouth as needed. 11/30/10  Yes Pecola Lawless, MD  Probiotic Product (ALIGN) 4 MG CAPS Take 1 capsule by mouth daily.     Yes Historical Provider, MD  pyridOXINE (VITAMIN B-6) 100 MG tablet Take 100 mg by mouth daily.     Yes Historical Provider, MD  spironolactone (ALDACTONE) 25 MG tablet Take 25 mg by mouth daily.   Yes Historical Provider, MD  vitamin B-12 (CYANOCOBALAMIN) 100 MCG tablet Take 250 mcg by mouth daily.     Yes Historical Provider, MD  warfarin (COUMADIN) 2.5 MG tablet Take 1.25-2.5 mg by mouth daily. Take 2.5mg  on Monday. Take 1.25mg  the rest of the week.   Yes Historical Provider, MD     . HYDROcodone-acetaminophen  1 tablet Oral Once   Family History Family History  Problem Relation Age of Onset  . Emphysema Father 76    deceased  . Other Mother 12    deceased old age  . Diverticulosis Brother   . Heart disease Brother   . Coronary artery disease Brother     S/P CABG  . Breast cancer Sister   . Cancer Sister     breast    Social History History   Social History  . Marital Status: Widowed    Spouse Name: N/A    Number of Children: N/A  . Years of Education: N/A   Occupational History  . Not on file.   Social History Main Topics  . Smoking status: Never Smoker   . Smokeless tobacco: Never Used  . Alcohol Use: No  . Drug Use: No  . Sexually Active: Not on file   Other Topics Concern  . Not on file   Social History Narrative   Lives in [place] by herself, though dtr and son-in-law live next door.    Review of Systems  General:  No chills, fever, night sweats or weight changes.  Cardiovascular:  ++ chest pain associated with diaphoresis as outlined above.   She has chronic LE edema. No dyspnea on exertion, orthopnea, palpitations, paroxysmal nocturnal dyspnea. Dermatological: No rash, lesions/masses Respiratory: No cough, dyspnea Urologic: No hematuria, dysuria Abdominal:   No nausea, vomiting, diarrhea, bright red blood per rectum,  melena, or hematemesis Neurologic:  No visual changes, wkns, changes in mental status. All other systems reviewed and are otherwise negative except as noted above.  Physical Exam  Blood pressure 187/73, pulse 70, temperature 97.7 F (36.5 C), temperature source Oral, resp. rate 16, SpO2 95.00%.  General: Pleasant, NAD Psych: Normal affect. Neuro: Alert and oriented X 3. Moves all extremities spontaneously. HEENT: Normal  Neck: Supple without bruits or JVD. Lungs:  Resp regular and unlabored, with fine crackles in the right base. Heart: IR, IR, brady.  No s3, s4, or murmurs. Abdomen: Soft, non-tender, non-distended, BS + x 4.  Extremities: +1 Edema to BLE with variscosities. No clubbing, cyanosis. DP/PT/Radials 2+ and equal bilaterally. Labs  Basename 11/14/11 1200  CKTOTAL --  CKMB --  TROPONINI <0.30   Lab Results  Component Value Date   WBC 6.0 11/14/2011   HGB 10.7* 11/14/2011   HCT 33.2* 11/14/2011   MCV 96.5 11/14/2011   PLT 248 11/14/2011     Lab 11/14/11 1200  NA 134*  K 4.8  CL 98  CO2 27  BUN 21  CREATININE 1.09  CALCIUM 9.2  PROT --  BILITOT --  ALKPHOS --  ALT --  AST --  GLUCOSE 115*  Radiology/Studies Chest 2 View  11/14/2011  IMPRESSION: Cardiomegaly and findings suggestive of pulmonary edema.    ECG: AF with slow ventricular response, rate 48. No Acute ST/T changes.   ASSESSMENT AND PLAN 1. Chest and Epigastric Pain: Patient currently complains of 2/10 lower chest and epigastric pain with no associated symptoms. At this point, there is no objective evidence of ischemia.  Place patient in observation and cycle cardiac enzymes.  Cont home meds.  As cxr suggests edema,  with crackles on exam, we will obtain 2d echo (nl in 2003).  If either CE are positive or echo abnl, would plan on cath on Monday (after holding coumadin for the weekend).  Otw, consider myoview as an outpt.  2.  Pulmonary Edema:  CXR suggestive of edema.  She does have crackles on exam, though neck veins are flat.  She usually takes spironolactone daily, though didn't take it the AM as she had plans to go to the bank and grocery shopping.  Will provide IV lasix tonight.  Check pBNP and echo as above.    3. Atrial Fibrillation: she does have a h/o slow response and HR was in 40's earlier today.  She is on low-dose bb therapy and we will reduce this to 12.5mg  daily at this time.  She is on coumadin therapy and INR is therapeutic.  If it is determined that she requires cath, we will need to hold coumadin.  4. HTN: BP elevated in ED.  She did not take her spironolactone this AM.  Follow with diuresis.  With downward titration of bb, she may require an additional agent.  5.  Chronic venous stasis:  Resume spiro.  SCD's.  Nicolasa Ducking, NP 11/14/2011, 4:57 PM  Cardiology Attending Patient interviewed and examined. Discussed with Nicolasa Ducking, NP.  Above note annotated and modified based upon my findings.  She presents with atypical chest discomfort and without acute EKG changes; however, chest x-ray suggests the presence of pulmonary edema, which does not explain her presenting problems. We will initiate a gentle diuresis with intravenous furosemide pending further testing to include d-dimer, BNP level and echocardiography.  Do to a relatively low likelihood of coronary disease and current therapy with warfarin, treatment with anticoagulants appropriate for acute coronary  syndromes will be deferred.  Aspirin will be added, but is of uncertain value in the setting of chronic treatment with warfarin and should be discontinued prior to discharge if myocardial ischemia is not the leading diagnosis at  that time.   Oral analgesics will be utilized for pain, which appears quite modest at the present time.  She is deriving uncertain benefit from low dose beta blocker, which can be discontinued while we have the luxury of monitoring her.  [place] Bing, MD 11/14/2011, 6:34 PM

## 2011-11-15 DIAGNOSIS — I359 Nonrheumatic aortic valve disorder, unspecified: Secondary | ICD-10-CM

## 2011-11-15 DIAGNOSIS — R079 Chest pain, unspecified: Secondary | ICD-10-CM

## 2011-11-15 LAB — BASIC METABOLIC PANEL
BUN: 21 mg/dL (ref 6–23)
CO2: 26 mEq/L (ref 19–32)
Chloride: 96 mEq/L (ref 96–112)
Creatinine, Ser: 1.23 mg/dL — ABNORMAL HIGH (ref 0.50–1.10)

## 2011-11-15 LAB — CBC
HCT: 33.9 % — ABNORMAL LOW (ref 36.0–46.0)
MCV: 95.2 fL (ref 78.0–100.0)
Platelets: 284 10*3/uL (ref 150–400)
RBC: 3.56 MIL/uL — ABNORMAL LOW (ref 3.87–5.11)
WBC: 7.4 10*3/uL (ref 4.0–10.5)

## 2011-11-15 LAB — TROPONIN I
Troponin I: <0.3 ng/mL (ref <0.30)
Troponin I: <0.3 ng/mL (ref <0.30)

## 2011-11-15 LAB — LIPID PANEL
HDL: 93 mg/dL (ref >39)
LDL Cholesterol: 88 mg/dL (ref 0–99)
Triglycerides: 61 mg/dL (ref <150)
VLDL: 12 mg/dL (ref 0–40)

## 2011-11-15 LAB — PROTIME-INR: Prothrombin Time: 23.5 seconds — ABNORMAL HIGH (ref 11.6–15.2)

## 2011-11-15 MED ORDER — NITROGLYCERIN 0.4 MG SL SUBL: 0.4000 mg | SUBLINGUAL_TABLET | SUBLINGUAL | Status: DC | PRN

## 2011-11-15 MED ORDER — TROLAMINE SALICYLATE 10 % EX CREA
TOPICAL_CREAM | CUTANEOUS | Status: DC | PRN
  Filled 2011-11-15: qty 85

## 2011-11-15 MED ORDER — METOPROLOL SUCCINATE ER 25 MG PO TB24: 12.5000 mg | ORAL_TABLET | Freq: Every day | ORAL | Status: DC

## 2011-11-15 NOTE — Discharge Summary (Signed)
Patient ID: Lindsey [name],  MRN: [id number], DOB/AGE: 1923/04/20 78 y.o.  Admit date: 11/14/2011 Discharge date: 11/15/2011  Primary Care Provider: Marga Melnick Primary Cardiologist: P. Eden Emms, MD  Discharge Diagnoses Principal Problem:  *Chest pain, musculoskeletal Active Problems:  VITAMIN D DEFICIENCY  Unspecified anemia  HYPERTENSION  Atrial fibrillation  GERD  OSTEOPOROSIS  VENOUS INSUFFICIENCY, CHRONIC, HX OF  Allergies Allergies  Allergen Reactions  . Haloperidol Lactate   . Hydromorphone Hcl     REACTION: SICK ON STOMACH  . Ibuprofen   . Melatonin   . Morphine   . Nortriptyline Hcl   . Pentazocine Lactate    Procedures  2D Echocardiogram 11/14/2011  Study Conclusions  - Left ventricle: The cavity size was normal. Wall thickness was normal. Systolic function was normal. The estimated ejection fraction was in the range of 60% to 65%. - Aortic valve: Mild regurgitation. - Left atrium: The atrium was mildly dilated. _____________  History of Present Illness  78 year old female with the above problem list. She has a h/o afib and is on chronic coumadin anticoagulation. She has no prior h/o CAD and reports a normal stress test > 10 yrs ago. She was in her USOH until the morning of admission when she began to experience non-radiating 10/10 epigastric pain described as sharp and constant. This was associated with diaphoresis and felt similar to GERD pain except was more severe. She took Maalox without relief and contacted EMS. Upon EMS arrival symptoms remained and her HR was in the 40's. En route to the ED, symptoms had eased off though they recurred following arrival in the ED.  Initial troponins were normal and ECG was non-acute.  CXR suggested mild edema.  She was placed in observation for further evaluation.  Hospital Course  Pt r/o for MI.  Chest pain resolved.  As she had mild edema on cxr and crackles on exam, she was treated with 1 dose of IV lasix  in the ED and her home dose of spironolactone was resumed.  2D echo this AM shows normal LV function.  She has been ambulating without difficulty and will be discharged home today in good condition.  Discharge Vitals Blood pressure 145/67, pulse 70, temperature 98.3 F (36.8 C), temperature source Oral, resp. rate 16, SpO2 92.00%.   Labs  CBC  Basename 11/15/11 0800 11/14/11 1200  WBC 7.4 6.0  NEUTROABS -- 3.9  HGB 11.4* 10.7*  HCT 33.9* 33.2*  MCV 95.2 96.5  PLT 284 248   Basic Metabolic Panel  Basename 11/15/11 0800 11/14/11 1200  NA 134* 134*  K 4.0 4.8  CL 96 98  CO2 26 27  GLUCOSE 183* 115*  BUN 21 21  CREATININE 1.23* 1.09  CALCIUM 9.4 9.2  MG -- --  PHOS -- --   Cardiac Enzymes  Basename 11/15/11 0850 11/15/11 0030 11/14/11 1934  CKTOTAL -- -- --  CKMB -- -- --  CKMBINDEX -- -- --  TROPONINI <0.30 <0.30 <0.30   D-Dimer  Basename 11/14/11 1933  DDIMER <0.27   Fasting Lipid Panel  Basename 11/15/11 0800  CHOL 193  HDL 93  LDLCALC 88  TRIG 61  CHOLHDL 2.1  LDLDIRECT --   Thyroid Function Tests  Basename 11/14/11 1933  TSH 1.760  T4TOTAL --  T3FREE --  THYROIDAB --   Disposition  Pt is being discharged home today in good condition.  Follow-up Plans & Appointments      Follow-up Information    Follow up with  [facility]. On 11/25/2011. (11:45 AM)    Contact information:   [address] [place] [phone]      Follow up with Charlton Haws, MD. On 12/06/2011. (9:30 AM)    Contact information:   [address] [place] [address] [phone]         Discharge Medications    Medication List     As of 11/15/2011  1:45 PM    STOP taking these medications         ALEVE 220 MG tablet   Generic drug: naproxen sodium      TAKE these medications         ALIGN 4 MG Caps   Take 1 capsule by mouth daily.      CALTRATE 600+D 600-400 MG-UNIT per tablet   Generic drug: Calcium  Carbonate-Vitamin D   Take 1 tablet by mouth 2 (two) times daily.      diazepam 5 MG tablet   Commonly known as: VALIUM   Take 5 mg by mouth every 6 (six) hours as needed.      docusate sodium 100 MG capsule   Commonly known as: COLACE   Take 100 mg by mouth daily.      ferrous sulfate 325 (65 FE) MG tablet   Take 325 mg by mouth daily with breakfast.      gabapentin 100 MG capsule   Commonly known as: NEURONTIN   Take 100 mg by mouth. 2 tablets at bed time      HYDROcodone-acetaminophen 5-325 MG per tablet   Commonly known as: NORCO/VICODIN   Take 1 tablet by mouth every 6 (six) hours as needed.      LIDODERM 5 %   Generic drug: lidocaine   Place 1 patch onto the skin. Remove & Discard patch within 12 hours or as directed by MD      metoprolol succinate 25 MG 24 hr tablet   Commonly known as: TOPROL-XL   Take 0.5 tablets (12.5 mg total) by mouth daily.      multivitamin tablet   Take 1 tablet by mouth daily.      nitroGLYCERIN 0.4 MG SL tablet   Commonly known as: NITROSTAT   Place 1 tablet (0.4 mg total) under the tongue every 5 (five) minutes x 3 doses as needed for chest pain.      pantoprazole 40 MG tablet   Commonly known as: PROTONIX   Take 40 mg by mouth daily.      polyethylene glycol powder powder   Commonly known as: GLYCOLAX/MIRALAX   Take 17 g by mouth as needed.      PREMARIN 0.625 MG tablet   Generic drug: estrogens (conjugated)   Take 0.625 mg by mouth every other day. Take daily for 21 days then do not take for 7 days.      pyridOXINE 100 MG tablet   Commonly known as: VITAMIN B-6   Take 100 mg by mouth daily.      spironolactone 25 MG tablet   Commonly known as: ALDACTONE   Take 25 mg by mouth daily.      vitamin B-12 100 MCG tablet   Commonly known as: CYANOCOBALAMIN   Take 250 mcg by mouth daily.      Vitamin D3 2000 UNITS Tabs   Take 1 tablet by mouth daily.      warfarin 2.5 MG tablet   Commonly known as: COUMADIN   Take 1.25-2.5  mg by mouth daily. Take 2.5mg  on Monday. Take 1.25mg  the rest of the week.        Outstanding Labs/Studies  None  Duration of Discharge Encounter   Greater than 30 minutes including physician time.  Signed, Nicolasa Ducking NP 11/15/2011, 1:45 PM

## 2011-11-15 NOTE — Progress Notes (Signed)
*  PRELIMINARY RESULTS* Echocardiogram 2D Echocardiogram has been performed.  Jeryl Columbia 11/15/2011, 12:00 PM

## 2011-11-15 NOTE — Progress Notes (Signed)
ANTICOAGULATION CONSULT NOTE - Follow Up Consult  Pharmacy Consult for Coumadin Indication: atrial fibrillation  Allergies  Allergen Reactions  . Haloperidol Lactate   . Hydromorphone Hcl     REACTION: SICK ON STOMACH  . Ibuprofen   . Melatonin   . Morphine   . Nortriptyline Hcl   . Pentazocine Lactate     Patient Measurements:   Heparin Dosing Weight:   Vital Signs: Temp: 98.3 F (36.8 C) (10/25 0504) Temp src: Oral (10/25 0504) BP: 145/67 mmHg (10/25 0504) Pulse Rate: 70  (10/25 0504)  Labs:  Basename 11/15/11 0850 11/15/11 0800 11/15/11 0030 11/14/11 1934 11/14/11 1933 11/14/11 1200  HGB -- 11.4* -- -- -- 10.7*  HCT -- 33.9* -- -- -- 33.2*  PLT -- 284 -- -- -- 248  APTT -- -- -- -- -- --  LABPROT -- 23.5* -- -- 22.1* 23.2*  INR -- 2.20* -- -- 2.03* 2.16*  HEPARINUNFRC -- -- -- -- -- --  CREATININE -- 1.23* -- -- -- 1.09  CKTOTAL -- -- -- -- -- --  CKMB -- -- -- -- -- --  TROPONINI <0.30 -- <0.30 <0.30 -- --    The CrCl is unknown because both a height and weight (above a minimum accepted value) are required for this calculation.   Medications:  Scheduled:    . acidophilus  1 capsule Oral Daily  . aspirin  324 mg Oral NOW   Or  . aspirin  300 mg Rectal NOW  . aspirin EC  81 mg Oral Daily  . calcium-vitamin D  1 tablet Oral BID  . cholecalciferol  2,000 Units Oral Daily  . docusate sodium  100 mg Oral Daily  . estrogens (conjugated)  0.625 mg Oral QODAY  . ferrous sulfate  325 mg Oral Q breakfast  . furosemide  40 mg Intravenous BID  . gabapentin  200 mg Oral QHS  . HYDROcodone-acetaminophen  1 tablet Oral Once  . metoprolol succinate  12.5 mg Oral Daily  . multivitamin with minerals  1 tablet Oral Daily  . nitroGLYCERIN  0.5 inch Topical Q6H  . pantoprazole  40 mg Oral Daily  . pyridOXINE  100 mg Oral Daily  . spironolactone  25 mg Oral Daily  . vitamin B-12  250 mcg Oral Daily  . warfarin  1.25 mg Oral Custom  . warfarin  1.25 mg Oral Once  .  warfarin  2.5 mg Oral Q Mon-1800  . Warfarin - Pharmacist Dosing Inpatient   Does not apply q1800  . DISCONTD: ALIGN  1 capsule Oral Daily  . DISCONTD: Calcium Carbonate-Vitamin D  1 tablet Oral BID  . DISCONTD: estrogens (conjugated)  0.625 mg Oral QODAY  . DISCONTD: metoprolol succinate  12.5 mg Oral Daily  . DISCONTD: multivitamin  1 tablet Oral Daily  . DISCONTD: Vitamin D3  1 tablet Oral Daily    Assessment: 78yo female with hx AFib who presented with atypical chest pain.  INR 2.2 this AM, therapeutic on home dose.  No bleeding problems noted.  Goal of Therapy:  INR 2-3 Monitor platelets by anticoagulation protocol: Yes   Plan:  1.  Continue home dose of Coumadin 2.  F/U in AM  Marisue Humble, PharmD Clinical Pharmacist  [facility]- [facility]

## 2011-11-15 NOTE — Progress Notes (Signed)
Patient ID: Lindsey [name], female   DOB: 11-11-1923, 78 y.o.   MRN: [id number]    Subjective:  Denies SSCP, palpitations or Dyspnea Anxious Couldn't sleep well  Objective:  Filed Vitals:   11/14/11 1812 11/14/11 1849 11/14/11 2153 11/15/11 0504  BP: 158/91 187/75 165/83 145/67  Pulse: 70 70 77 70  Temp: 98 F (36.7 C) 98.2 F (36.8 C) 98.6 F (37 C) 98.3 F (36.8 C)  TempSrc: Oral  Oral Oral  Resp: 18 18 16 16   SpO2: 99% 97% 93% 92%    Intake/Output from previous day: No intake or output data in the 24 hours ending 11/15/11 0842  Physical Exam: Affect appropriate Healthy:  appears stated age HEENT: normal Neck supple with no adenopathy JVP normal no bruits no thyromegaly Lungs clear with no wheezing and good diaphragmatic motion Heart:  S1/S2 no murmur, no rub, gallop or click PMI normal Abdomen: benighn, BS positve, no tenderness, no AAA no bruit.  No HSM or HJR Distal pulses intact with no bruits Plus one bilateral  Edema varicose veins Neuro non-focal Skin warm and dry No muscular weakness   Lab Results: Basic Metabolic Panel:  Basename 11/14/11 1200  NA 134*  K 4.8  CL 98  CO2 27  GLUCOSE 115*  BUN 21  CREATININE 1.09  CALCIUM 9.2  MG --  PHOS --   Liver Function Tests: No results found for this basename: AST:2,ALT:2,ALKPHOS:2,BILITOT:2,PROT:2,ALBUMIN:2 in the last 72 hours No results found for this basename: LIPASE:2,AMYLASE:2 in the last 72 hours CBC:  Basename 11/14/11 1200  WBC 6.0  NEUTROABS 3.9  HGB 10.7*  HCT 33.2*  MCV 96.5  PLT 248   Cardiac Enzymes:  Basename 11/15/11 0030 11/14/11 1934 11/14/11 1721  CKTOTAL -- -- --  CKMB -- -- --  CKMBINDEX -- -- --  TROPONINI <0.30 <0.30 <0.30   BNP: No components found with this basename: POCBNP:3 D-Dimer:  Basename 11/14/11 1933  DDIMER <0.27   Hemoglobin A1C: No results found for this basename: HGBA1C in the last 72 hours Fasting Lipid Panel: No results found for this  basename: CHOL,HDL,LDLCALC,TRIG,CHOLHDL,LDLDIRECT in the last 72 hours Thyroid Function Tests:  Basename 11/14/11 1933  TSH 1.760  T4TOTAL --  T3FREE --  THYROIDAB --   Anemia Panel: No results found for this basename: VITAMINB12,FOLATE,FERRITIN,TIBC,IRON,RETICCTPCT in the last 72 hours  Imaging: Dg Chest 2 View  11/14/2011  *RADIOLOGY REPORT*  Clinical Data: Chest pain, shortness of breath  CHEST - 2 VIEW  Comparison: 11/05/2010; 07/19/2009  Findings:  Grossly unchanged enlarged cardiac silhouette and mediastinal contours.  Pulmonary vasculature is less distinct on the present examination with cephalization of flow.  The lungs appear mildly hyperexpanded with flattening of bilateral hemidiaphragms.  No definite pleural effusion or pneumothorax.  Grossly unchanged bones including multilevel thoracic spine degenerative change.  Multiple surgical clips overlie the expected location of the gastric fundus.  IMPRESSION: Cardiomegaly and findings suggestive of pulmonary edema.   Original Report Authenticated By: Waynard Reeds, M.D.     Cardiac Studies:  ECG:  afib nonspecific ST/T wave changes  No acute ischemic changes 10/24   Telemetry:  Afib no long pauses  11/15/2011   Echo: pending  Medications:     . acidophilus  1 capsule Oral Daily  . aspirin  324 mg Oral NOW   Or  . aspirin  300 mg Rectal NOW  . aspirin EC  81 mg Oral Daily  . calcium-vitamin D  1 tablet Oral BID  .  cholecalciferol  2,000 Units Oral Daily  . docusate sodium  100 mg Oral Daily  . estrogens (conjugated)  0.625 mg Oral QODAY  . ferrous sulfate  325 mg Oral Q breakfast  . furosemide  40 mg Intravenous BID  . gabapentin  200 mg Oral QHS  . HYDROcodone-acetaminophen  1 tablet Oral Once  . metoprolol succinate  12.5 mg Oral Daily  . multivitamin with minerals  1 tablet Oral Daily  . nitroGLYCERIN  0.5 inch Topical Q6H  . pantoprazole  40 mg Oral Daily  . pyridOXINE  100 mg Oral Daily  . spironolactone  25  mg Oral Daily  . vitamin B-12  250 mcg Oral Daily  . warfarin  1.25 mg Oral Custom  . warfarin  1.25 mg Oral Once  . warfarin  2.5 mg Oral Q Mon-1800  . Warfarin - Pharmacist Dosing Inpatient   Does not apply q1800  . DISCONTD: ALIGN  1 capsule Oral Daily  . DISCONTD: Calcium Carbonate-Vitamin D  1 tablet Oral BID  . DISCONTD: estrogens (conjugated)  0.625 mg Oral QODAY  . DISCONTD: metoprolol succinate  12.5 mg Oral Daily  . DISCONTD: multivitamin  1 tablet Oral Daily  . DISCONTD: Vitamin D3  1 tablet Oral Daily       Assessment/Plan:  Chest Pain: Atypical R/O no ECG changes.  D-dimer negative  If echo shows good EF D/C home.  Givne age and coumadin with atypical  Presentation don not think cath or myovue needed Diastolic CHF:  Mild increase in BNP clinically stable lasix given in hospital lungs clear Echo pending Afib:  Good rate control would continue beta blocker and coumadin INR Rx  D/C home if echo ok  Charlton Haws 11/15/2011, 8:42 AM

## 2011-11-25 ENCOUNTER — Ambulatory Visit (INDEPENDENT_AMBULATORY_CARE_PROVIDER_SITE_OTHER): Payer: Medicare Other

## 2011-11-25 DIAGNOSIS — Z7901 Long term (current) use of anticoagulants: Secondary | ICD-10-CM

## 2011-11-25 DIAGNOSIS — I4891 Unspecified atrial fibrillation: Secondary | ICD-10-CM

## 2011-11-25 LAB — POCT INR: INR: 2.3

## 2011-12-06 ENCOUNTER — Encounter: Payer: Medicare Other | Admitting: Cardiovascular Disease

## 2011-12-09 ENCOUNTER — Ambulatory Visit (INDEPENDENT_AMBULATORY_CARE_PROVIDER_SITE_OTHER): Payer: Medicare Other | Admitting: Cardiovascular Disease

## 2011-12-09 ENCOUNTER — Ambulatory Visit (INDEPENDENT_AMBULATORY_CARE_PROVIDER_SITE_OTHER): Payer: Medicare Other | Admitting: *Deleted

## 2011-12-09 ENCOUNTER — Encounter: Payer: Self-pay | Admitting: Cardiovascular Disease

## 2011-12-09 VITALS — BP 149/72 | HR 80 | Ht 65.0 in | Wt 190.0 lb

## 2011-12-09 DIAGNOSIS — I4891 Unspecified atrial fibrillation: Secondary | ICD-10-CM

## 2011-12-09 DIAGNOSIS — R0789 Other chest pain: Secondary | ICD-10-CM

## 2011-12-09 DIAGNOSIS — Z8679 Personal history of other diseases of the circulatory system: Secondary | ICD-10-CM

## 2011-12-09 DIAGNOSIS — Z7901 Long term (current) use of anticoagulants: Secondary | ICD-10-CM

## 2011-12-09 DIAGNOSIS — I10 Essential (primary) hypertension: Secondary | ICD-10-CM

## 2011-12-09 NOTE — Patient Instructions (Signed)
Your physician wants you to follow-up in: 6 MONTHS WITH DR. NISHAN You will receive a reminder letter in the mail two months in advance. If you don't receive a letter, please call our office to schedule the follow-up appointment.  

## 2011-12-09 NOTE — Assessment & Plan Note (Signed)
Atypical R/O no recurrence since D/C  Given age and no prevoius CAD will observe

## 2011-12-09 NOTE — Assessment & Plan Note (Signed)
Good rate control and anticoagulation INR today in clinic 

## 2011-12-09 NOTE — Progress Notes (Signed)
Patient ID: Lindsey [name], female   DOB: 21-Feb-1923, 78 y.o.   MRN: [id number] 78 year old female with  a h/o afib and is on chronic coumadin anticoagulation. She has no prior h/o CAD and reports a normal stress test > 10 yrs ago. She was in her USOH until the morning of 10/24  when she began to experience non-radiating 10/10 epigastric pain described as sharp and constant. This was associated with diaphoresis and felt similar to GERD pain except was more severe. She took Maalox without relief and contacted EMS. Upon EMS arrival symptoms remained and her HR was in the 40's. En route to the ED, symptoms had eased off though they recurred following arrival in the ED. Initial troponins were normal and ECG was non-acute. CXR suggested mild edema. She was placed in observation for further evaluation. R/O with no further issues and D/C on home meds  Still with some vestibular symptoms and dizzyness  ROS: Denies fever, malais, weight loss, blurry vision, decreased visual acuity, cough, sputum, SOB, hemoptysis, pleuritic pain, palpitaitons, heartburn, abdominal pain, melena, lower extremity edema, claudication, or rash.  All other systems reviewed and negative  General: Affect appropriate Elderly white female with kyphosis HEENT: normal Neck supple with no adenopathy JVP normal no bruits no thyromegaly Lungs clear with no wheezing and good diaphragmatic motion Heart:  S1/S2 no murmur, no rub, gallop or click PMI normal Abdomen: benighn, BS positve, no tenderness, no AAA no bruit.  No HSM or HJR Distal pulses intact with no bruits Plus 2 LLE  Edema with varicosities  Neuro non-focal Skin warm and dry No muscular weakness   Current Outpatient Prescriptions  Medication Sig Dispense Refill  . Calcium Carbonate-Vitamin D (CALTRATE 600+D) 600-400 MG-UNIT per tablet Take 1 tablet by mouth 2 (two) times daily.        . Cholecalciferol (VITAMIN D3) 2000 UNITS TABS Take 1 tablet by mouth daily.          . diazepam (VALIUM) 5 MG tablet Take 5 mg by mouth every 6 (six) hours as needed.        . docusate sodium (COLACE) 100 MG capsule Take 100 mg by mouth daily.        [name] estrogens, conjugated, (PREMARIN) 0.625 MG tablet Take 0.625 mg by mouth every other day. Take daily for 21 days then do not take for 7 days.      . ferrous sulfate 325 (65 FE) MG tablet Take 325 mg by mouth daily with breakfast.        . gabapentin (NEURONTIN) 100 MG capsule Take 100 mg by mouth. 2 tablets at bed time       . HYDROcodone-acetaminophen (NORCO) 5-325 MG per tablet Take 1 tablet by mouth every 6 (six) hours as needed.        . lidocaine (LIDODERM) 5 % Place 1 patch onto the skin. Remove & Discard patch within 12 hours or as directed by MD       . metoprolol succinate (TOPROL-XL) 25 MG 24 hr tablet Take 0.5 tablets (12.5 mg total) by mouth daily.      . Multiple Vitamin (MULTIVITAMIN) tablet Take 1 tablet by mouth daily.        . nitroGLYCERIN (NITROSTAT) 0.4 MG SL tablet Place 1 tablet (0.4 mg total) under the tongue every 5 (five) minutes x 3 doses as needed for chest pain.  25 tablet  3  . pantoprazole (PROTONIX) 40 MG tablet Take 40 mg by mouth daily.      [name]  polyethylene glycol powder (MIRALAX) powder Take 17 g by mouth as needed.  255 g  11  . Probiotic Product (ALIGN) 4 MG CAPS Take 1 capsule by mouth daily.        [name] pyridOXINE (VITAMIN B-6) 100 MG tablet Take 100 mg by mouth daily.        [name] spironolactone (ALDACTONE) 25 MG tablet Take 25 mg by mouth daily.      . vitamin B-12 (CYANOCOBALAMIN) 100 MCG tablet Take 250 mcg by mouth daily.        [name] warfarin (COUMADIN) 2.5 MG tablet Take 1.25-2.5 mg by mouth daily. Take 2.5mg  on Monday. Take 1.25mg  the rest of the week.        Allergies  Haloperidol lactate; Hydromorphone hcl; Ibuprofen; Melatonin; Morphine; Nortriptyline hcl; and Pentazocine lactate  Electrocardiogram: 10/25  Afib rate 48 nonspecific St/T wave changes  Assessment and Plan

## 2011-12-09 NOTE — Assessment & Plan Note (Signed)
Well controlled.  Continue current medications and low sodium Dash type diet.    

## 2011-12-09 NOTE — Assessment & Plan Note (Signed)
Variocose veins in both legs with left worse than right.  Weight stable at 186  Contininue current diuretic

## 2011-12-23 ENCOUNTER — Ambulatory Visit (INDEPENDENT_AMBULATORY_CARE_PROVIDER_SITE_OTHER): Payer: Medicare Other | Admitting: *Deleted

## 2011-12-23 DIAGNOSIS — I4891 Unspecified atrial fibrillation: Secondary | ICD-10-CM

## 2011-12-23 DIAGNOSIS — Z7901 Long term (current) use of anticoagulants: Secondary | ICD-10-CM

## 2011-12-23 LAB — POCT INR: INR: 2.1

## 2011-12-26 ENCOUNTER — Other Ambulatory Visit: Payer: Self-pay | Admitting: Cardiovascular Disease

## 2011-12-26 MED ORDER — METOPROLOL SUCCINATE ER 25 MG PO TB24: 12.5000 mg | ORAL_TABLET | Freq: Every day | ORAL | Status: DC

## 2012-01-07 ENCOUNTER — Ambulatory Visit (INDEPENDENT_AMBULATORY_CARE_PROVIDER_SITE_OTHER): Payer: Medicare Other | Admitting: *Deleted

## 2012-01-07 DIAGNOSIS — Z7901 Long term (current) use of anticoagulants: Secondary | ICD-10-CM

## 2012-01-07 DIAGNOSIS — I4891 Unspecified atrial fibrillation: Secondary | ICD-10-CM

## 2012-01-27 ENCOUNTER — Ambulatory Visit (INDEPENDENT_AMBULATORY_CARE_PROVIDER_SITE_OTHER): Payer: Medicare Other | Admitting: *Deleted

## 2012-01-27 DIAGNOSIS — I4891 Unspecified atrial fibrillation: Secondary | ICD-10-CM

## 2012-01-27 DIAGNOSIS — Z7901 Long term (current) use of anticoagulants: Secondary | ICD-10-CM

## 2012-01-30 ENCOUNTER — Emergency Department (HOSPITAL_COMMUNITY)
Admission: EM | Admit: 2012-01-30 | Discharge: 2012-01-30 | Disposition: A | Discharge status: Home / Self Care | Payer: Medicare Other | Attending: Emergency Medicine | Admitting: Emergency Medicine

## 2012-01-30 ENCOUNTER — Encounter (HOSPITAL_COMMUNITY): Payer: Self-pay | Admitting: Emergency Medicine

## 2012-01-30 ENCOUNTER — Emergency Department (HOSPITAL_COMMUNITY): Payer: Medicare Other

## 2012-01-30 DIAGNOSIS — M810 Age-related osteoporosis without current pathological fracture: Secondary | ICD-10-CM | POA: Insufficient documentation

## 2012-01-30 DIAGNOSIS — Z8639 Personal history of other endocrine, nutritional and metabolic disease: Secondary | ICD-10-CM | POA: Insufficient documentation

## 2012-01-30 DIAGNOSIS — Z79899 Other long term (current) drug therapy: Secondary | ICD-10-CM | POA: Insufficient documentation

## 2012-01-30 DIAGNOSIS — Z96649 Presence of unspecified artificial hip joint: Secondary | ICD-10-CM | POA: Insufficient documentation

## 2012-01-30 DIAGNOSIS — Z8719 Personal history of other diseases of the digestive system: Secondary | ICD-10-CM | POA: Insufficient documentation

## 2012-01-30 DIAGNOSIS — Z8739 Personal history of other diseases of the musculoskeletal system and connective tissue: Secondary | ICD-10-CM | POA: Insufficient documentation

## 2012-01-30 DIAGNOSIS — Z8679 Personal history of other diseases of the circulatory system: Secondary | ICD-10-CM | POA: Insufficient documentation

## 2012-01-30 DIAGNOSIS — M129 Arthropathy, unspecified: Secondary | ICD-10-CM | POA: Insufficient documentation

## 2012-01-30 DIAGNOSIS — Z7901 Long term (current) use of anticoagulants: Secondary | ICD-10-CM | POA: Insufficient documentation

## 2012-01-30 DIAGNOSIS — Z862 Personal history of diseases of the blood and blood-forming organs and certain disorders involving the immune mechanism: Secondary | ICD-10-CM | POA: Insufficient documentation

## 2012-01-30 DIAGNOSIS — I4891 Unspecified atrial fibrillation: Secondary | ICD-10-CM | POA: Insufficient documentation

## 2012-01-30 DIAGNOSIS — R079 Chest pain, unspecified: Secondary | ICD-10-CM

## 2012-01-30 DIAGNOSIS — R0789 Other chest pain: Secondary | ICD-10-CM | POA: Insufficient documentation

## 2012-01-30 DIAGNOSIS — D509 Iron deficiency anemia, unspecified: Secondary | ICD-10-CM | POA: Insufficient documentation

## 2012-01-30 DIAGNOSIS — K219 Gastro-esophageal reflux disease without esophagitis: Secondary | ICD-10-CM | POA: Insufficient documentation

## 2012-01-30 LAB — COMPREHENSIVE METABOLIC PANEL
BUN: 22 mg/dL (ref 6–23)
CO2: 23 mEq/L (ref 19–32)
Chloride: 101 mEq/L (ref 96–112)
Creatinine, Ser: 1.08 mg/dL (ref 0.50–1.10)
GFR calc Af Amer: 52 mL/min — ABNORMAL LOW (ref >90)
GFR calc non Af Amer: 45 mL/min — ABNORMAL LOW (ref >90)
Glucose, Bld: 124 mg/dL — ABNORMAL HIGH (ref 70–99)
Total Bilirubin: 0.6 mg/dL (ref 0.3–1.2)

## 2012-01-30 LAB — CBC WITH DIFFERENTIAL/PLATELET
Basophils Relative: 0 % (ref 0–1)
Eosinophils Absolute: 0.2 10*3/uL (ref 0.0–0.7)
Eosinophils Relative: 2 % (ref 0–5)
Hemoglobin: 11 g/dL — ABNORMAL LOW (ref 12.0–15.0)
Lymphs Abs: 1.5 10*3/uL (ref 0.7–4.0)
MCH: 32.4 pg (ref 26.0–34.0)
MCHC: 33.2 g/dL (ref 30.0–36.0)
MCV: 97.4 fL (ref 78.0–100.0)
Monocytes Absolute: 0.9 10*3/uL (ref 0.1–1.0)
RBC: 3.4 MIL/uL — ABNORMAL LOW (ref 3.87–5.11)

## 2012-01-30 LAB — TROPONIN I: Troponin I: <0.3 ng/mL (ref <0.30)

## 2012-01-30 MED ORDER — HYDROCODONE-ACETAMINOPHEN 5-325 MG PO TABS
1.0000 | ORAL_TABLET | Freq: Once | ORAL | Status: AC
  Administered 2012-01-30: 1 via ORAL
  Filled 2012-01-30: qty 1

## 2012-01-30 NOTE — ED Provider Notes (Signed)
History     CSN: [id number]  Arrival date & time 01/30/12  1507   First MD Initiated Contact with Patient 01/30/12 1528      Chief Complaint  Patient presents with  . Chest Pain    (Consider location/radiation/quality/duration/timing/severity/associated sxs/prior treatment) Patient is a 79 y.o. female presenting with chest pain. The history is provided by the patient (the pt complains of epigastric abd pain). No language interpreter was used.  Chest Pain The chest pain began 3 - 5 hours ago. Chest pain occurs constantly. The chest pain is resolved. Associated with: nothing. At its most intense, the pain is at 5/10. The pain is currently at 2/10. The severity of the pain is moderate. The quality of the pain is described as aching. The pain does not radiate. Exacerbated by: nothing. Pertinent negatives for primary symptoms include no fever, no fatigue, no cough and no abdominal pain.  Pertinent negatives for past medical history include no seizures.     Past Medical History  Diagnosis Date  . Atrial fibrillation     a. with bradycardia - remains on bb;  b. chronic coumadin.  . Diverticulitis 2003-2008  . GERD (gastroesophageal reflux disease)   . Osteoporosis     T score -3.9 @ forearm  . Vitamin D deficiency     Vitamin D 27 in 2010  . Anemia     PMH  of  . Duodenal ulcer 1976    surgery   . 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 Despina Hick  . Hx of colonoscopy 02/2006    Internal hemorrhoids  . Nipple discharge   . Arthritis   . Hemorrhoids   . Venous stasis     a. bilat LE w/ variscosities.  [name] Hearing loss   . Syncope     a. 2003 - felt to be vasovagal;  b. 06/2001 Echo: EF 55-60%, no rwma;    Past Surgical History  Procedure Date  . Appendectomy   . Cholecystectomy   . Tonsillectomy and adenoidectomy   . Total hip arthroplasty 2010    transfusions  . Breast biopsy 11/05/10    breast lump removed- rt br  . Laparoscopic gastrotomy w/ repair of ulcer   .  Abdominal hysterectomy     Family History  Problem Relation Age of Onset  . Emphysema Father 18    deceased  . Other Mother 70    deceased old age  . Diverticulosis Brother   . Heart disease Brother   . Coronary artery disease Brother     S/P CABG  . Breast cancer Sister   . Cancer Sister     breast    History  Substance Use Topics  . Smoking status: Never Smoker   . Smokeless tobacco: Never Used  . Alcohol Use: No    OB History    Grav Para Term Preterm Abortions TAB SAB Ect Mult Living                  Review of Systems  Constitutional: Negative for fever and fatigue.  HENT: Negative for congestion, sinus pressure and ear discharge.   Eyes: Negative for discharge.  Respiratory: Negative for cough.   Cardiovascular: Positive for chest pain.  Gastrointestinal: Negative for abdominal pain and diarrhea.  Genitourinary: Negative for frequency and hematuria.  Musculoskeletal: Negative for back pain.  Skin: Negative for rash.  Neurological: Negative for seizures and headaches.  Hematological: Negative.   Psychiatric/Behavioral: Negative for hallucinations.    Allergies  Haloperidol lactate; Hydromorphone hcl; Ibuprofen; Melatonin; Morphine; Nortriptyline hcl; and Pentazocine lactate  Home Medications   Current Outpatient Rx  Name  Route  Sig  Dispense  Refill  . CALCIUM CARBONATE-VITAMIN D 600-400 MG-UNIT PO TABS   Oral   Take 1 tablet by mouth 2 (two) times daily.           [name] VITAMIN D3 2000 UNITS PO TABS   Oral   Take 1 tablet by mouth daily.           [name] DIAZEPAM 5 MG PO TABS   Oral   Take 5 mg by mouth every 6 (six) hours as needed. For anxiety         . DOCUSATE SODIUM 100 MG PO CAPS   Oral   Take 100 mg by mouth daily.           [name] ESTROGENS CONJUGATED 0.625 MG PO TABS   Oral   Take 0.625 mg by mouth every other day. Take daily for 21 days then do not take for 7 days.         Di Kindle SULFATE 325 (65 FE) MG PO TABS   Oral   Take 325  mg by mouth daily with breakfast.           . GABAPENTIN 100 MG PO CAPS   Oral   Take 200 mg by mouth at bedtime.          [name] HYDROCODONE-ACETAMINOPHEN 5-325 MG PO TABS   Oral   Take 1 tablet by mouth every 5 (five) hours as needed. For pain         . METOPROLOL SUCCINATE ER 25 MG PO TB24   Oral   Take 0.5 tablets (12.5 mg total) by mouth daily.   30 tablet   3   . ONE-DAILY MULTI VITAMINS PO TABS   Oral   Take 1 tablet by mouth daily.           [name] NITROGLYCERIN 0.4 MG SL SUBL   Sublingual   Place 1 tablet (0.4 mg total) under the tongue every 5 (five) minutes x 3 doses as needed for chest pain.   25 tablet   3   . PANTOPRAZOLE SODIUM 40 MG PO TBEC   Oral   Take 40 mg by mouth daily.         [name] POLYETHYLENE GLYCOL 3350 PO PACK   Oral   Take 17 g by mouth daily as needed. For constipation         . ALIGN 4 MG PO CAPS   Oral   Take 1 capsule by mouth daily.           [name] VITAMIN B-6 100 MG PO TABS   Oral   Take 100 mg by mouth daily.           [name] SPIRONOLACTONE 25 MG PO TABS   Oral   Take 25 mg by mouth daily.         . WARFARIN SODIUM 2.5 MG PO TABS   Oral   Take 1.25-2.5 mg by mouth every evening. Take 2.5mg  on Fridays. Take 1.25mg  the rest of the week.           BP 140/59  Temp 98.3 F (36.8 C) (Oral)  Resp 19  SpO2 95%  Physical Exam  Constitutional: She is oriented to person, place, and time. She appears well-developed.  HENT:  Head: Normocephalic and atraumatic.  Eyes: Conjunctivae normal and  EOM are normal. No scleral icterus.  Neck: Neck supple. No thyromegaly present.  Cardiovascular: Normal rate and regular rhythm.  Exam reveals no gallop and no friction rub.   No murmur heard. Pulmonary/Chest: No stridor. She has no wheezes. She has no rales. She exhibits no tenderness.  Abdominal: She exhibits no distension. There is tenderness. There is no rebound.       Epigastric tenerness  Musculoskeletal: Normal range of motion. She  exhibits no edema.  Lymphadenopathy:    She has no cervical adenopathy.  Neurological: She is oriented to person, place, and time. Coordination normal.  Skin: No rash noted. No erythema.  Psychiatric: She has a normal mood and affect. Her behavior is normal.    ED Course  Procedures (including critical care time)  Labs Reviewed  CBC WITH DIFFERENTIAL - Abnormal; Notable for the following:    WBC 11.6 (*)  WHITE COUNT CONFIRMED ON SMEAR   RBC 3.40 (*)     Hemoglobin 11.0 (*)     HCT 33.1 (*)     Neutro Abs 9.0 (*)     All other components within normal limits  COMPREHENSIVE METABOLIC PANEL - Abnormal; Notable for the following:    Glucose, Bld 124 (*)     Albumin 3.4 (*)     AST 69 (*)     GFR calc non Af Amer 45 (*)     GFR calc Af Amer 52 (*)     All other components within normal limits  TROPONIN I   Dg Chest Port 1 View  01/30/2012  *RADIOLOGY REPORT*  Clinical Data: Chest pain  PORTABLE CHEST - 1 VIEW  Comparison: 11/14/2011  Findings: The heart is again enlarged in size.  The lungs are well aerated without focal infiltrate.  Mild interstitial changes are seen likely of a chronic nature.  No bony abnormality is noted.  IMPRESSION: Mild interstitial change likely chronic in nature.  No acute abnormality is noted.   Original Report Authenticated By: Alcide Clever, M.D.      1. Chest pain      Date: 01/30/2012  Rate: 50  Rhythm: atrial fibrillation  QRS Axis: normal  Intervals: normal  ST/T Wave abnormalities: nonspecific ST changes  Conduction Disutrbances:none  Narrative Interpretation:   Old EKG Reviewed: unchanged    MDM          Benny Lennert, MD 01/30/12 1947

## 2012-01-30 NOTE — ED Notes (Signed)
Lab at bedside

## 2012-01-30 NOTE — ED Notes (Signed)
Radiology at bedside to perform CXR.

## 2012-01-30 NOTE — ED Notes (Signed)
Pt was given 324 ASA by EMS.

## 2012-01-30 NOTE — ED Notes (Signed)
Pt denies CP and mid epi gastric pain at this time.

## 2012-01-30 NOTE — ED Notes (Signed)
Pt c/o sharp CP, reports she had same thing about a month ago dx with GERD. Pt reports she has also been having some abd pain. HR 54, on beta blockers. Pt took 3 Nitro before EMS arrived. Pt denies CP at this time but is still having some abd pain. BP 118 palpated. EKG unremarkable.

## 2012-02-05 ENCOUNTER — Encounter: Payer: Self-pay | Admitting: Internal Medicine

## 2012-02-05 ENCOUNTER — Ambulatory Visit (INDEPENDENT_AMBULATORY_CARE_PROVIDER_SITE_OTHER): Payer: Medicare Other | Admitting: Internal Medicine

## 2012-02-05 VITALS — BP 122/68 | HR 83 | Temp 98.6°F | Wt 189.0 lb

## 2012-02-05 DIAGNOSIS — J309 Allergic rhinitis, unspecified: Secondary | ICD-10-CM

## 2012-02-05 DIAGNOSIS — K219 Gastro-esophageal reflux disease without esophagitis: Secondary | ICD-10-CM

## 2012-02-05 MED ORDER — PANTOPRAZOLE SODIUM 40 MG PO TBEC: 40.0000 mg | DELAYED_RELEASE_TABLET | Freq: Two times a day (BID) | ORAL | Status: DC

## 2012-02-05 MED ORDER — POLYETHYLENE GLYCOL 3350 17 G PO PACK: 17.0000 g | PACK | Freq: Every day | ORAL | Status: DC | PRN

## 2012-02-05 MED ORDER — SPIRONOLACTONE 25 MG PO TABS: 25.0000 mg | ORAL_TABLET | Freq: Every day | ORAL | Status: DC

## 2012-02-05 NOTE — Patient Instructions (Addendum)
Plain Mucinex (NOT D) for thick secretions ;force NON dairy fluids .   Plain  Loratidine 10 mg daily  as needed for itchy eyes & sneezing. The triggers for reflux  include stress; the "aspirin family" ; alcohol; peppermint; and caffeine (coffee, tea, cola, and chocolate). The aspirin family would include aspirin and the nonsteroidal agents such as ibuprofen &  Naproxen. Tylenol would not cause reflux. If having symptoms ; food & drink should be avoided for @ least 2 hours before going to bed.

## 2012-02-05 NOTE — Progress Notes (Signed)
  Subjective:    Patient ID: Lindsey [name], female    DOB: [birth year], 79 y.o.   MRN: [id number]  HPI The respiratory tract symptoms began 02/04/12 as rhinitis &  dry cough without  sputum. Fever , chills and sweats not present .Cough was not associated with shortness of breath or wheezing. Extrinsic symptoms of itchy eyes present .  Symptoms not present include frontal headache, facial pain, dental pain, sore throat, nasal purulence, earache , and otic discharge Watery eyes & sneezing were not noted.  Treatment with  Corcidin HP was partially effective. There is no history of asthma. The patient had never smoked                   Review of Systems  Hospital records 01/30/12 were reviewed. She developed chest pain unresponsive to 3 nitroglycerin after ingesting an egg salad sandwich which had vinegar. She had  also eaten some citrus fruits. After the 3 nitroglycerin  she broke out in a sweat and had blurred vision. Troponin was negative. There's been no significant recurrence of symptoms following the  ER visit.    Objective:   Physical Exam  General appearance:well nourished; no acute distress or increased work of breathing is present.  No  lymphadenopathy about the head, neck, or axilla noted.  Eyes: No conjunctival inflammation or lid edema is present. There is no scleral icterus. Ears:  External ear exam shows no significant lesions or deformities.  Hearing aids bilaterally Nose:  External nasal examination shows no deformity or inflammation. Nasal mucosa are pink and moist without lesions or exudates. No septal dislocation or deviation.No obstruction to airflow.  Oral exam: Dental hygiene is good; lips and gums are healthy appearing.There is no oropharyngeal erythema or exudate noted.  Neck:  No deformities,  masses, or tenderness noted. Accentuated curvature of upper thoracic  spine.  Heart:  Normal rate and slightly irregular rhythm. S1 and S2 normal without gallop,  click, rub or other extra sounds. S4 with slurring at  LSB  Lungs:Chest clear to auscultation; no wheezes, rhonchi,rales ,or rubs present.No increased work of breathing.   Extremities:  No cyanosis or clubbing  noted . DJD of hands. 1 + ankle edema Skin: Warm & dry          Assessment & Plan:  #1 rhinitis, probable extrinsic. No evidence of rhinosinusitis  #2 chest pain, most likely esophageal spasm related to reflux due to dietary triggers  Plan: See recommendations

## 2012-02-07 ENCOUNTER — Telehealth: Payer: Self-pay | Admitting: Internal Medicine

## 2012-02-07 MED ORDER — AMOXICILLIN 500 MG PO TABS: 500.0000 mg | ORAL_TABLET | Freq: Three times a day (TID) | ORAL | Status: DC

## 2012-02-07 NOTE — Telephone Encounter (Signed)
Patient Information:  Caller Name: Ella  Phone: [phone]  Patient: Lindsey [name], Lindsey [name]  Gender: Female  DOB: 31-Oct-1923  Age: 79 Years  PCP: Marga Melnick  Office Follow Up:  Does the office need to follow up with this patient?: No  Instructions For The Office: N/A   Symptoms  Reason For Call & Symptoms: Patient states she was in the office on 02/05/12 for cold congestion. She was diangosed with allergic Rhinitis.  She states today she coughed up yellow productive mucus and it worrried her. No further productive cough. Afebrile. She is eating and drinking  Reviewed Health History In EMR: Yes  Reviewed Medications In EMR: Yes  Reviewed Allergies In EMR: Yes  Reviewed Surgeries / Procedures: No  Date of Onset of Symptoms: 02/05/2012  Treatments Tried: [facility] -compared Cloracedin  Treatments Tried Worked: No  Guideline(s) Used:  Colds  Disposition Per Guideline:   Home Care  Reason For Disposition Reached:   Colds with no complications  Advice Given:  Reassurance  It sounds like an uncomplicated cold that we can treat at home.  Colds are very common and may make you feel uncomfortable.  Colds are caused by viruses, and no medicine or "shot" will cure an uncomplicated cold.  Colds are usually not serious.  Here is some care advice that should help.  For a Runny Nose With Profuse Discharge:   Nasal mucus and discharge helps to wash viruses and bacteria out of the nose and sinuses.  Blowing the nose is all that is needed.  If the skin around your nostrils gets irritated, apply a tiny amount of petroleum ointment to the nasal openings once or twice a day.  Humidifier:  If the air in your home is dry, use a cool-mist humidifier  Treatment for Associated Symptoms of Colds:  For muscle aches, headaches, or moderate fever (more than 101 F or 38.9 C): Take acetaminophen every 4 hours.  Sore throat: Try throat lozenges, hard candy, or warm chicken broth.  Cough: Use  cough drops.  Hydrate: Drink adequate liquids.  Call Back If:  Difficulty breathing occurs  Fever lasts more than 3 days  Nasal discharge lasts more than 10 days  Cough lasts more than 3 weeks  You become worse

## 2012-02-07 NOTE — Telephone Encounter (Signed)
Per Dr.Hopper Amoxicillin 500 mg tid #21/0 refills, spoke with patient. Patient aware if no improvement with symptoms per at home instruction given by RX, antibiotic will be available at pharmacy

## 2012-02-21 ENCOUNTER — Ambulatory Visit: Payer: Medicare Other | Admitting: Internal Medicine

## 2012-02-24 ENCOUNTER — Ambulatory Visit (INDEPENDENT_AMBULATORY_CARE_PROVIDER_SITE_OTHER): Payer: Medicare Other | Admitting: *Deleted

## 2012-02-24 DIAGNOSIS — Z7901 Long term (current) use of anticoagulants: Secondary | ICD-10-CM

## 2012-02-24 DIAGNOSIS — I4891 Unspecified atrial fibrillation: Secondary | ICD-10-CM

## 2012-02-24 LAB — POCT INR: INR: 3.3

## 2012-03-23 ENCOUNTER — Ambulatory Visit (INDEPENDENT_AMBULATORY_CARE_PROVIDER_SITE_OTHER): Payer: Medicare Other | Admitting: Pharmacist

## 2012-03-23 DIAGNOSIS — Z7901 Long term (current) use of anticoagulants: Secondary | ICD-10-CM

## 2012-03-23 DIAGNOSIS — I4891 Unspecified atrial fibrillation: Secondary | ICD-10-CM

## 2012-03-24 ENCOUNTER — Other Ambulatory Visit: Payer: Self-pay | Admitting: *Deleted

## 2012-03-24 MED ORDER — WARFARIN SODIUM 2.5 MG PO TABS: ORAL_TABLET | ORAL | Status: DC

## 2012-04-20 ENCOUNTER — Ambulatory Visit (INDEPENDENT_AMBULATORY_CARE_PROVIDER_SITE_OTHER): Payer: Medicare Other | Admitting: Pharmacist

## 2012-04-20 DIAGNOSIS — I4891 Unspecified atrial fibrillation: Secondary | ICD-10-CM

## 2012-04-20 DIAGNOSIS — Z7901 Long term (current) use of anticoagulants: Secondary | ICD-10-CM

## 2012-05-03 ENCOUNTER — Other Ambulatory Visit: Payer: Self-pay | Admitting: Internal Medicine

## 2012-05-20 ENCOUNTER — Ambulatory Visit (INDEPENDENT_AMBULATORY_CARE_PROVIDER_SITE_OTHER): Payer: Medicare Other | Admitting: *Deleted

## 2012-05-20 DIAGNOSIS — Z7901 Long term (current) use of anticoagulants: Secondary | ICD-10-CM

## 2012-05-20 DIAGNOSIS — I4891 Unspecified atrial fibrillation: Secondary | ICD-10-CM

## 2012-06-18 ENCOUNTER — Encounter: Payer: Self-pay | Admitting: Cardiovascular Disease

## 2012-06-18 ENCOUNTER — Ambulatory Visit (INDEPENDENT_AMBULATORY_CARE_PROVIDER_SITE_OTHER): Payer: Medicare Other | Admitting: Cardiovascular Disease

## 2012-06-18 ENCOUNTER — Ambulatory Visit (INDEPENDENT_AMBULATORY_CARE_PROVIDER_SITE_OTHER): Payer: Medicare Other | Admitting: *Deleted

## 2012-06-18 VITALS — BP 100/70 | HR 84 | Ht 65.0 in | Wt 188.0 lb

## 2012-06-18 DIAGNOSIS — I4891 Unspecified atrial fibrillation: Secondary | ICD-10-CM

## 2012-06-18 DIAGNOSIS — Z8679 Personal history of other diseases of the circulatory system: Secondary | ICD-10-CM

## 2012-06-18 DIAGNOSIS — Z7901 Long term (current) use of anticoagulants: Secondary | ICD-10-CM

## 2012-06-18 DIAGNOSIS — I10 Essential (primary) hypertension: Secondary | ICD-10-CM

## 2012-06-18 NOTE — Assessment & Plan Note (Signed)
Stable continue current dose of lasix 

## 2012-06-18 NOTE — Progress Notes (Signed)
Patient ID: Lindsey [name], female   DOB: 1923/02/03, 79 y.o.   MRN: [id number] 79 year old female with a h/o afib and is on chronic coumadin anticoagulation. She has no prior h/o CAD and reports a normal stress test > 10 yrs ago. She was in her USOH until the morning of 10/24 when she began to experience non-radiating 10/10 epigastric pain described as sharp and constant. This was associated with diaphoresis and felt similar to GERD pain except was more severe. She took Maalox without relief and contacted EMS. Upon EMS arrival symptoms remained and her HR was in the 40's. En route to the ED, symptoms had eased off though they recurred following arrival in the ED. Initial troponins were normal and ECG was non-acute. CXR suggested mild edema. She was placed in observation for further evaluation. R/O with no further issues and D/C on home meds Still with some vestibular symptoms and dizzyness  Echo 11/15/11 Study Conclusions  - Left ventricle: The cavity size was normal. Wall thickness was normal. Systolic function was normal. The estimated ejection fraction was in the range of 60% to 65%. - Aortic valve: Mild regurgitation. - Left atrium: The atrium was mildly dilated.  Doing well with dyspnea Chronic pain on norco.  Needs INR checked today  ROS: Denies fever, malais, weight loss, blurry vision, decreased visual acuity, cough, sputum, SOB, hemoptysis, pleuritic pain, palpitaitons, heartburn, abdominal pain, melena, lower extremity edema, claudication, or rash.  All other systems reviewed and negative  General: Affect appropriate Elderly kyphotic female HEENT: normal Neck supple with no adenopathy JVP normal no bruits no thyromegaly Lungs clear with no wheezing and good diaphragmatic motion Heart:  S1/S2 no murmur, no rub, gallop or click PMI normal Abdomen: benighn, BS positve, no tenderness, no AAA no bruit.  No HSM or HJR Distal pulses intact with no bruits Plus one bilateral edema  with numerous varicosities Neuro non-focal Skin warm and dry No muscular weakness   Current Outpatient Prescriptions  Medication Sig Dispense Refill  . amoxicillin (AMOXIL) 500 MG tablet Take 1 tablet (500 mg total) by mouth 3 (three) times daily.  21 tablet  0  . Calcium Carbonate-Vitamin D (CALTRATE 600+D) 600-400 MG-UNIT per tablet Take 1 tablet by mouth 2 (two) times daily.        . Cholecalciferol (VITAMIN D3) 2000 UNITS TABS Take 1 tablet by mouth daily.        . diazepam (VALIUM) 5 MG tablet Take 5 mg by mouth every 6 (six) hours as needed. For anxiety      . docusate sodium (COLACE) 100 MG capsule Take 100 mg by mouth daily.        . ferrous sulfate 325 (65 FE) MG tablet Take 325 mg by mouth daily with breakfast.        . gabapentin (NEURONTIN) 100 MG capsule Take 200 mg by mouth at bedtime.       [name] HYDROcodone-acetaminophen (NORCO) 5-325 MG per tablet Take 1 tablet by mouth every 5 (five) hours as needed. For pain      . metoprolol succinate (TOPROL-XL) 25 MG 24 hr tablet Take 0.5 tablets (12.5 mg total) by mouth daily.  30 tablet  3  . Multiple Vitamin (MULTIVITAMIN) tablet Take 1 tablet by mouth daily.        . nitroGLYCERIN (NITROSTAT) 0.4 MG SL tablet Place 1 tablet (0.4 mg total) under the tongue every 5 (five) minutes x 3 doses as needed for chest pain.  25 tablet  3  . pantoprazole (PROTONIX) 40 MG tablet Take 1 tablet (40 mg total) by mouth 2 (two) times daily.  90 tablet  1  . polyethylene glycol (MIRALAX / GLYCOLAX) packet Take 17 g by mouth daily as needed. For constipation  30 packet  5  . Probiotic Product (ALIGN) 4 MG CAPS Take 1 capsule by mouth daily.        [name] pyridOXINE (VITAMIN B-6) 100 MG tablet Take 100 mg by mouth daily.        [name] spironolactone (ALDACTONE) 25 MG tablet Take 1 tablet (25 mg total) by mouth daily.  90 tablet  1  . warfarin (COUMADIN) 2.5 MG tablet Take as directed by coumadin clinic  30 tablet  3  . estrogens, conjugated, (PREMARIN) 0.625 MG  tablet Take 0.625 mg by mouth every other day. Take daily for 21 days then do not take for 7 days.       No current facility-administered medications for this visit.    Allergies  Haloperidol lactate; Hydromorphone hcl; Ibuprofen; Melatonin; Morphine; Nortriptyline hcl; and Pentazocine lactate  Electrocardiogram:  Assessment and Plan

## 2012-06-18 NOTE — Assessment & Plan Note (Signed)
Good rate control and anticoagulation INR today

## 2012-06-18 NOTE — Assessment & Plan Note (Signed)
Well controlled.  Continue current medications and low sodium Dash type diet.    

## 2012-06-22 ENCOUNTER — Ambulatory Visit: Payer: Medicare Other | Admitting: Cardiovascular Disease

## 2012-06-29 ENCOUNTER — Ambulatory Visit (INDEPENDENT_AMBULATORY_CARE_PROVIDER_SITE_OTHER): Payer: Medicare Other | Admitting: *Deleted

## 2012-06-29 DIAGNOSIS — I4891 Unspecified atrial fibrillation: Secondary | ICD-10-CM

## 2012-06-29 DIAGNOSIS — Z7901 Long term (current) use of anticoagulants: Secondary | ICD-10-CM

## 2012-07-15 ENCOUNTER — Ambulatory Visit (INDEPENDENT_AMBULATORY_CARE_PROVIDER_SITE_OTHER): Payer: Medicare Other | Admitting: *Deleted

## 2012-07-15 DIAGNOSIS — I4891 Unspecified atrial fibrillation: Secondary | ICD-10-CM

## 2012-07-15 DIAGNOSIS — Z7901 Long term (current) use of anticoagulants: Secondary | ICD-10-CM

## 2012-07-18 ENCOUNTER — Other Ambulatory Visit: Payer: Self-pay | Admitting: Internal Medicine

## 2012-07-20 NOTE — Telephone Encounter (Signed)
Refill done per protocol.  

## 2012-08-05 ENCOUNTER — Ambulatory Visit (INDEPENDENT_AMBULATORY_CARE_PROVIDER_SITE_OTHER): Payer: Medicare Other | Admitting: *Deleted

## 2012-08-05 DIAGNOSIS — Z7901 Long term (current) use of anticoagulants: Secondary | ICD-10-CM

## 2012-08-05 DIAGNOSIS — I4891 Unspecified atrial fibrillation: Secondary | ICD-10-CM

## 2012-08-05 LAB — POCT INR: INR: 2.1

## 2012-08-25 ENCOUNTER — Other Ambulatory Visit: Payer: Self-pay | Admitting: Internal Medicine

## 2012-08-31 ENCOUNTER — Other Ambulatory Visit: Payer: Self-pay | Admitting: *Deleted

## 2012-08-31 ENCOUNTER — Ambulatory Visit (INDEPENDENT_AMBULATORY_CARE_PROVIDER_SITE_OTHER): Payer: Medicare Other | Admitting: *Deleted

## 2012-08-31 ENCOUNTER — Other Ambulatory Visit: Payer: Self-pay

## 2012-08-31 ENCOUNTER — Telehealth: Payer: Self-pay

## 2012-08-31 DIAGNOSIS — Z7901 Long term (current) use of anticoagulants: Secondary | ICD-10-CM

## 2012-08-31 DIAGNOSIS — I4891 Unspecified atrial fibrillation: Secondary | ICD-10-CM

## 2012-08-31 LAB — POCT INR: INR: 2.6

## 2012-08-31 MED ORDER — METOPROLOL SUCCINATE ER 25 MG PO TB24: 12.5000 mg | ORAL_TABLET | Freq: Every day | ORAL | Status: DC

## 2012-08-31 NOTE — Telephone Encounter (Signed)
Patient walked in office requesting refill on metoprolol.Metoprolol refill sent to pharmacy.

## 2012-10-05 ENCOUNTER — Ambulatory Visit (INDEPENDENT_AMBULATORY_CARE_PROVIDER_SITE_OTHER): Payer: Medicare Other | Admitting: *Deleted

## 2012-10-05 DIAGNOSIS — Z7901 Long term (current) use of anticoagulants: Secondary | ICD-10-CM

## 2012-10-05 DIAGNOSIS — I4891 Unspecified atrial fibrillation: Secondary | ICD-10-CM

## 2012-10-05 LAB — POCT INR: INR: 2.7

## 2012-10-10 ENCOUNTER — Other Ambulatory Visit: Payer: Self-pay | Admitting: Cardiovascular Disease

## 2012-11-16 ENCOUNTER — Ambulatory Visit (INDEPENDENT_AMBULATORY_CARE_PROVIDER_SITE_OTHER): Payer: Medicare Other | Admitting: *Deleted

## 2012-11-16 DIAGNOSIS — Z7901 Long term (current) use of anticoagulants: Secondary | ICD-10-CM

## 2012-11-16 DIAGNOSIS — I4891 Unspecified atrial fibrillation: Secondary | ICD-10-CM

## 2012-11-16 LAB — POCT INR: INR: 2.7

## 2012-12-28 ENCOUNTER — Ambulatory Visit (INDEPENDENT_AMBULATORY_CARE_PROVIDER_SITE_OTHER): Payer: Medicare Other | Admitting: *Deleted

## 2012-12-28 DIAGNOSIS — Z7901 Long term (current) use of anticoagulants: Secondary | ICD-10-CM

## 2012-12-28 DIAGNOSIS — I4891 Unspecified atrial fibrillation: Secondary | ICD-10-CM

## 2012-12-28 LAB — POCT INR: INR: 3.4

## 2013-01-18 ENCOUNTER — Ambulatory Visit: Payer: Medicare Other | Admitting: Cardiovascular Disease

## 2013-01-20 ENCOUNTER — Ambulatory Visit (INDEPENDENT_AMBULATORY_CARE_PROVIDER_SITE_OTHER): Payer: Medicare Other | Admitting: Pharmacist

## 2013-01-20 DIAGNOSIS — Z7901 Long term (current) use of anticoagulants: Secondary | ICD-10-CM

## 2013-01-20 DIAGNOSIS — I4891 Unspecified atrial fibrillation: Secondary | ICD-10-CM

## 2013-02-15 ENCOUNTER — Ambulatory Visit (INDEPENDENT_AMBULATORY_CARE_PROVIDER_SITE_OTHER): Payer: Medicare Other

## 2013-02-15 ENCOUNTER — Ambulatory Visit (INDEPENDENT_AMBULATORY_CARE_PROVIDER_SITE_OTHER): Payer: Medicare Other | Admitting: Cardiovascular Disease

## 2013-02-15 VITALS — BP 158/78 | HR 78 | Wt 190.0 lb

## 2013-02-15 DIAGNOSIS — I4891 Unspecified atrial fibrillation: Secondary | ICD-10-CM

## 2013-02-15 DIAGNOSIS — Z8679 Personal history of other diseases of the circulatory system: Secondary | ICD-10-CM

## 2013-02-15 DIAGNOSIS — Z7901 Long term (current) use of anticoagulants: Secondary | ICD-10-CM

## 2013-02-15 LAB — POCT INR: INR: 4

## 2013-02-15 NOTE — Assessment & Plan Note (Signed)
Well controlled.  Continue current medications and low sodium Dash type diet.    

## 2013-02-15 NOTE — Assessment & Plan Note (Signed)
Despite age she is a good candidate for anticoagulation for stroke prevention.  Prefer coumadin to novel agent  No bleeding from spider veins.  Some mild bleeding from hemorrhoids  Continue stool softener and miralax

## 2013-02-15 NOTE — Assessment & Plan Note (Signed)
Stable will see if she can get compression stockings that are a bit less tight

## 2013-02-15 NOTE — Patient Instructions (Signed)
Your physician wants you to follow-up in: YEAR WITH DR NISHAN  You will receive a reminder letter in the mail two months in advance. If you don't receive a letter, please call our office to schedule the follow-up appointment.  Your physician recommends that you continue on your current medications as directed. Please refer to the Current Medication list given to you today. 

## 2013-02-15 NOTE — Progress Notes (Signed)
Patient ID: Lindsey [name], female   DOB: [birth year], 80 y.o.   MRN: [id number] 80 year old female with a h/o afib and is on chronic coumadin anticoagulation. She has no prior h/o CAD and reports a normal stress test > 10 yrs ago. She was in her USOH until the morning of 10/24 when she began to experience non-radiating 10/10 epigastric pain described as sharp and constant. This was associated with diaphoresis and felt similar to GERD pain except was more severe. She took Maalox without relief and contacted EMS. Upon EMS arrival symptoms remained and her HR was in the 40's. En route to the ED, symptoms had eased off though they recurred following arrival in the ED. Initial troponins were normal and ECG was non-acute. CXR suggested mild edema. She was placed in observation for further evaluation. R/O with no further issues and D/C on home meds Still with some vestibular symptoms and dizzyness  Echo 11/15/11  Study Conclusions  - Left ventricle: The cavity size was normal. Wall thickness was normal. Systolic function was normal. The estimated ejection fraction was in the range of 60% to 65%. - Aortic valve: Mild regurgitation. - Left atrium: The atrium was mildly dilated.  Doing well with dyspnea Chronic pain on norco. Needs INR checked today  Has horrible spider veins in both legs and compression stockings are hard to put on       ROS: Denies fever, malais, weight loss, blurry vision, decreased visual acuity, cough, sputum, SOB, hemoptysis, pleuritic pain, palpitaitons, heartburn, abdominal pain, melena, lower extremity edema, claudication, or rash.  All other systems reviewed and negative  General: Affect appropriate Healthy:  appears stated age HEENT: normal Neck supple with no adenopathy JVP normal no bruits no thyromegaly Lungs clear with no wheezing and good diaphragmatic motion Heart:  S1/S2 no murmur, no rub, gallop or click PMI normal Abdomen: benighn, BS positve, no tenderness,  no AAA no bruit.  No HSM or HJR Distal pulses intact with no bruits Plus one bilateral edema with large spider veins  Neuro non-focal Skin warm and dry No muscular weakness   Current Outpatient Prescriptions  Medication Sig Dispense Refill  . amoxicillin (AMOXIL) 500 MG tablet Take 1 tablet (500 mg total) by mouth 3 (three) times daily.  21 tablet  0  . Calcium Carbonate-Vitamin D (CALTRATE 600+D) 600-400 MG-UNIT per tablet Take 1 tablet by mouth 2 (two) times daily.        . Cholecalciferol (VITAMIN D3) 2000 UNITS TABS Take 1 tablet by mouth daily.        . diazepam (VALIUM) 5 MG tablet Take 5 mg by mouth every 6 (six) hours as needed. For anxiety      . docusate sodium (COLACE) 100 MG capsule Take 100 mg by mouth daily.        [name] estrogens, conjugated, (PREMARIN) 0.625 MG tablet Take 0.625 mg by mouth every other day. Take daily for 21 days then do not take for 7 days.      . ferrous sulfate 325 (65 FE) MG tablet Take 325 mg by mouth daily with breakfast.        . gabapentin (NEURONTIN) 100 MG capsule Take 200 mg by mouth at bedtime.       [name] HYDROcodone-acetaminophen (NORCO) 5-325 MG per tablet Take 1 tablet by mouth every 5 (five) hours as needed. For pain      . metoprolol succinate (TOPROL-XL) 25 MG 24 hr tablet Take 0.5 tablets (12.5 mg total) by  mouth daily.  45 tablet  3  . Multiple Vitamin (MULTIVITAMIN) tablet Take 1 tablet by mouth daily.        . nitroGLYCERIN (NITROSTAT) 0.4 MG SL tablet Place 1 tablet (0.4 mg total) under the tongue every 5 (five) minutes x 3 doses as needed for chest pain.  25 tablet  3  . pantoprazole (PROTONIX) 40 MG tablet TAKE 1 TABLET BY MOUTH TWICE DAILY  180 tablet  1  . polyethylene glycol (MIRALAX / GLYCOLAX) packet Take 17 g by mouth daily as needed. For constipation  30 packet  5  . polyethylene glycol powder (GLYCOLAX/MIRALAX) powder TAKE 17 GRAMS BY MOUTH DAILY AS NEEDED FOR CONSTIPATION.  527 g  5  . Probiotic Product (ALIGN) 4 MG CAPS Take 1  capsule by mouth daily.        [name]. pyridOXINE (VITAMIN B-6) 100 MG tablet Take 100 mg by mouth daily.        [name]. spironolactone (ALDACTONE) 25 MG tablet Take 1 tablet (25 mg total) by mouth daily.  90 tablet  1  . warfarin (COUMADIN) 2.5 MG tablet TAKE AS DIRECTED BY COUMADIN CLINIC  30 tablet  3   No current facility-administered medications for this visit.    Allergies  Haloperidol lactate; Hydromorphone hcl; Ibuprofen; Melatonin; Morphine; Nortriptyline hcl; and Pentazocine lactate  Electrocardiogram:  afib rate 50 otherwise normal  02/11/12  Today Afib rate 77 normal no change from last year   Assessment and Plan

## 2013-02-15 NOTE — Assessment & Plan Note (Signed)
Good rate control and anticoagulation Coumadin check today

## 2013-02-18 ENCOUNTER — Other Ambulatory Visit: Payer: Self-pay | Admitting: Internal Medicine

## 2013-02-18 NOTE — Telephone Encounter (Signed)
Rx sent to the pharmacy by e-script.  Pt needs office visit and labs.//AB/CMA

## 2013-03-01 ENCOUNTER — Ambulatory Visit (INDEPENDENT_AMBULATORY_CARE_PROVIDER_SITE_OTHER): Payer: Medicare Other | Admitting: *Deleted

## 2013-03-01 DIAGNOSIS — Z5181 Encounter for therapeutic drug level monitoring: Secondary | ICD-10-CM | POA: Insufficient documentation

## 2013-03-01 DIAGNOSIS — I4891 Unspecified atrial fibrillation: Secondary | ICD-10-CM

## 2013-03-01 DIAGNOSIS — Z7901 Long term (current) use of anticoagulants: Secondary | ICD-10-CM

## 2013-03-01 LAB — POCT INR: INR: 3.6

## 2013-03-11 ENCOUNTER — Ambulatory Visit (INDEPENDENT_AMBULATORY_CARE_PROVIDER_SITE_OTHER): Payer: Medicare Other | Admitting: Pharmacist

## 2013-03-11 DIAGNOSIS — Z7901 Long term (current) use of anticoagulants: Secondary | ICD-10-CM

## 2013-03-11 DIAGNOSIS — I4891 Unspecified atrial fibrillation: Secondary | ICD-10-CM

## 2013-03-11 DIAGNOSIS — Z5181 Encounter for therapeutic drug level monitoring: Secondary | ICD-10-CM

## 2013-03-11 LAB — POCT INR: INR: 2.8

## 2013-03-29 ENCOUNTER — Ambulatory Visit (INDEPENDENT_AMBULATORY_CARE_PROVIDER_SITE_OTHER): Payer: Medicare Other | Admitting: Pharmacist

## 2013-03-29 DIAGNOSIS — Z7901 Long term (current) use of anticoagulants: Secondary | ICD-10-CM

## 2013-03-29 DIAGNOSIS — I4891 Unspecified atrial fibrillation: Secondary | ICD-10-CM

## 2013-03-29 DIAGNOSIS — Z5181 Encounter for therapeutic drug level monitoring: Secondary | ICD-10-CM

## 2013-03-29 LAB — POCT INR: INR: 3.1

## 2013-04-08 ENCOUNTER — Ambulatory Visit (INDEPENDENT_AMBULATORY_CARE_PROVIDER_SITE_OTHER): Payer: Medicare Other | Admitting: Pharmacist

## 2013-04-08 DIAGNOSIS — Z5181 Encounter for therapeutic drug level monitoring: Secondary | ICD-10-CM

## 2013-04-08 DIAGNOSIS — I4891 Unspecified atrial fibrillation: Secondary | ICD-10-CM

## 2013-04-08 DIAGNOSIS — Z7901 Long term (current) use of anticoagulants: Secondary | ICD-10-CM

## 2013-04-08 LAB — POCT INR: INR: 4.4

## 2013-04-12 ENCOUNTER — Other Ambulatory Visit (INDEPENDENT_AMBULATORY_CARE_PROVIDER_SITE_OTHER): Payer: Medicare Other

## 2013-04-12 ENCOUNTER — Encounter: Payer: Self-pay | Admitting: Internal Medicine

## 2013-04-12 ENCOUNTER — Ambulatory Visit (INDEPENDENT_AMBULATORY_CARE_PROVIDER_SITE_OTHER): Payer: Medicare Other | Admitting: Internal Medicine

## 2013-04-12 VITALS — BP 144/70 | HR 75 | Temp 97.7°F | Resp 15 | Wt 187.2 lb

## 2013-04-12 DIAGNOSIS — K219 Gastro-esophageal reflux disease without esophagitis: Secondary | ICD-10-CM

## 2013-04-12 DIAGNOSIS — R7309 Other abnormal glucose: Secondary | ICD-10-CM

## 2013-04-12 DIAGNOSIS — I10 Essential (primary) hypertension: Secondary | ICD-10-CM

## 2013-04-12 LAB — CBC WITH DIFFERENTIAL/PLATELET
Basophils Absolute: 0 K/uL (ref 0.0–0.1)
Basophils Relative: 0.4 % (ref 0.0–3.0)
Eosinophils Absolute: 0.2 K/uL (ref 0.0–0.7)
Eosinophils Relative: 2.7 % (ref 0.0–5.0)
HCT: 33.8 % — ABNORMAL LOW (ref 36.0–46.0)
Hemoglobin: 11.3 g/dL — ABNORMAL LOW (ref 12.0–15.0)
Lymphocytes Relative: 26.8 % (ref 12.0–46.0)
Lymphs Abs: 2.2 K/uL (ref 0.7–4.0)
MCHC: 33.5 g/dL (ref 30.0–36.0)
MCV: 96.3 fl (ref 78.0–100.0)
Monocytes Absolute: 0.5 K/uL (ref 0.1–1.0)
Monocytes Relative: 6.1 % (ref 3.0–12.0)
Neutro Abs: 5.3 K/uL (ref 1.4–7.7)
Neutrophils Relative %: 64 % (ref 43.0–77.0)
Platelets: 261 K/uL (ref 150.0–400.0)
RBC: 3.51 Mil/uL — ABNORMAL LOW (ref 3.87–5.11)
RDW: 13.1 % (ref 11.5–14.6)
WBC: 8.3 K/uL (ref 4.5–10.5)

## 2013-04-12 LAB — BASIC METABOLIC PANEL
BUN: 20 mg/dL (ref 6–23)
CALCIUM: 9.5 mg/dL (ref 8.4–10.5)
CHLORIDE: 102 meq/L (ref 96–112)
CO2: 29 mEq/L (ref 19–32)
CREATININE: 1.2 mg/dL (ref 0.4–1.2)
GFR: 43.44 mL/min — AB (ref >60.00)
Glucose, Bld: 112 mg/dL — ABNORMAL HIGH (ref 70–99)
Potassium: 4.4 mEq/L (ref 3.5–5.1)
Sodium: 138 mEq/L (ref 135–145)

## 2013-04-12 LAB — HEMOGLOBIN A1C: Hgb A1c MFr Bld: 6.5 % (ref 4.6–6.5)

## 2013-04-12 MED ORDER — SPIRONOLACTONE 25 MG PO TABS: ORAL_TABLET | ORAL | Status: DC

## 2013-04-12 MED ORDER — PANTOPRAZOLE SODIUM 40 MG PO TBEC: DELAYED_RELEASE_TABLET | ORAL | Status: DC

## 2013-04-12 NOTE — Assessment & Plan Note (Signed)
BMET 

## 2013-04-12 NOTE — Progress Notes (Signed)
   Subjective:    Patient ID: Lindsey [name], female    DOB: [birth year], 80 y.o.   MRN: [id number]  HPI Blood pressure range 140-156/70 @ MD visits Compliant with anti hypertemsive medication. No lightheadedness or other adverse medication effect described.  A low salt diet is followed. No exercise ;walks with cane@ advice of Orthopedist.      Review of Systems  Significant headaches, epistaxis, chest pain, palpitations, exertional dyspnea, claudication,or paroxysmal nocturnal dyspnea absent. Chronic ,stable edema present.   She denies melena, rectal bleeding, unexplained weight loss, dysphagia, or abdominal pain. On PPI daily.  Recent fungal vaginitis treated @ Gyn; daughter concerned about DM risk. Chart review reveals random hyperglycemia.      Objective:   Physical Exam Gen.: Healthy and well-nourished in appearance. Alert, appropriate and cooperative throughout exam. Appears younger than stated age  Head: Normocephalic without obvious abnormalities Eyes: No corneal or conjunctival inflammation noted. No icterus noted.  Dense arcus Mouth: Oral mucosa and oropharynx reveal no lesions or exudates. Teeth in good repair. Neck: No deformities, masses, or tenderness noted. Thyroid normal. Lungs: Normal respiratory effort; chest expands symmetrically. Lungs are clear to auscultation without rales, wheezes, or increased work of breathing. Heart: Normal rate and rhythm. Normal S1 and S2. No gallop, click, or rub. Grade 1/2 systolic  murmur. Abdomen: Bowel sounds normal; abdomen soft and nontender. No masses, organomegaly or hernias noted.                                   Musculoskeletal/extremities:Accentuated curvature of upper thoracic spine.  No clubbing or cyanosis.1+ edema.Hand joints mixed  DJD PIP changes.  Fingernail  health good.  Vascular: Carotid, radial artery, dorsalis pedis and  posterior tibial pulses are equal. Edema decreases pedal pulses.No bruits  present. Neurologic: Alert and oriented x3.  Gait :using cane     Skin: Intact without suspicious lesions or rashes. Lymph: No cervical, axillary lymphadenopathy present. Psych: Mood and affect are flat but interactive                                                                                       Assessment & Plan:  See Current Assessment & Plan in Problem List under specific Diagnosis

## 2013-04-12 NOTE — Patient Instructions (Signed)
Your next office appointment will be determined based upon review of your pending labs . Those instructions will be transmitted to you  by mail. 

## 2013-04-12 NOTE — Assessment & Plan Note (Signed)
CBC  Renew PPI

## 2013-04-12 NOTE — Progress Notes (Signed)
Pre visit review using our clinic review tool, if applicable. No additional management support is needed unless otherwise documented below in the visit note. 

## 2013-04-13 DIAGNOSIS — R7309 Other abnormal glucose: Secondary | ICD-10-CM | POA: Insufficient documentation

## 2013-04-13 NOTE — Assessment & Plan Note (Signed)
Recent fungal vaginitis A1c ordered

## 2013-04-15 ENCOUNTER — Ambulatory Visit (INDEPENDENT_AMBULATORY_CARE_PROVIDER_SITE_OTHER): Payer: Medicare Other | Admitting: *Deleted

## 2013-04-15 DIAGNOSIS — Z5181 Encounter for therapeutic drug level monitoring: Secondary | ICD-10-CM

## 2013-04-15 DIAGNOSIS — I4891 Unspecified atrial fibrillation: Secondary | ICD-10-CM

## 2013-04-15 DIAGNOSIS — Z7901 Long term (current) use of anticoagulants: Secondary | ICD-10-CM

## 2013-04-15 LAB — POCT INR: INR: 2.5

## 2013-04-18 ENCOUNTER — Other Ambulatory Visit: Payer: Self-pay | Admitting: Internal Medicine

## 2013-04-18 DIAGNOSIS — D649 Anemia, unspecified: Secondary | ICD-10-CM

## 2013-04-18 DIAGNOSIS — E1149 Type 2 diabetes mellitus with other diabetic neurological complication: Secondary | ICD-10-CM

## 2013-04-18 DIAGNOSIS — R7309 Other abnormal glucose: Secondary | ICD-10-CM

## 2013-04-18 DIAGNOSIS — E119 Type 2 diabetes mellitus without complications: Secondary | ICD-10-CM | POA: Insufficient documentation

## 2013-04-18 DIAGNOSIS — G629 Polyneuropathy, unspecified: Secondary | ICD-10-CM

## 2013-04-19 ENCOUNTER — Encounter: Payer: Self-pay | Admitting: *Deleted

## 2013-04-28 ENCOUNTER — Other Ambulatory Visit: Payer: Self-pay | Admitting: Cardiovascular Disease

## 2013-04-29 ENCOUNTER — Ambulatory Visit (INDEPENDENT_AMBULATORY_CARE_PROVIDER_SITE_OTHER): Payer: Medicare Other | Admitting: Pharmacist

## 2013-04-29 DIAGNOSIS — Z5181 Encounter for therapeutic drug level monitoring: Secondary | ICD-10-CM

## 2013-04-29 DIAGNOSIS — I4891 Unspecified atrial fibrillation: Secondary | ICD-10-CM

## 2013-04-29 DIAGNOSIS — Z7901 Long term (current) use of anticoagulants: Secondary | ICD-10-CM

## 2013-04-29 LAB — POCT INR: INR: 1.5

## 2013-05-06 ENCOUNTER — Ambulatory Visit (INDEPENDENT_AMBULATORY_CARE_PROVIDER_SITE_OTHER): Payer: Medicare Other | Admitting: Pharmacist

## 2013-05-06 DIAGNOSIS — Z5181 Encounter for therapeutic drug level monitoring: Secondary | ICD-10-CM

## 2013-05-06 DIAGNOSIS — Z7901 Long term (current) use of anticoagulants: Secondary | ICD-10-CM

## 2013-05-06 DIAGNOSIS — I4891 Unspecified atrial fibrillation: Secondary | ICD-10-CM

## 2013-05-06 LAB — POCT INR: INR: 1.9

## 2013-05-12 ENCOUNTER — Ambulatory Visit (INDEPENDENT_AMBULATORY_CARE_PROVIDER_SITE_OTHER): Payer: Medicare Other | Admitting: Pharmacist

## 2013-05-12 DIAGNOSIS — Z5181 Encounter for therapeutic drug level monitoring: Secondary | ICD-10-CM

## 2013-05-12 DIAGNOSIS — Z7901 Long term (current) use of anticoagulants: Secondary | ICD-10-CM

## 2013-05-12 DIAGNOSIS — I4891 Unspecified atrial fibrillation: Secondary | ICD-10-CM

## 2013-05-12 LAB — POCT INR: INR: 1.9

## 2013-05-21 ENCOUNTER — Ambulatory Visit (INDEPENDENT_AMBULATORY_CARE_PROVIDER_SITE_OTHER): Payer: Medicare Other | Admitting: *Deleted

## 2013-05-21 DIAGNOSIS — Z5181 Encounter for therapeutic drug level monitoring: Secondary | ICD-10-CM

## 2013-05-21 DIAGNOSIS — I4891 Unspecified atrial fibrillation: Secondary | ICD-10-CM

## 2013-05-21 DIAGNOSIS — Z7901 Long term (current) use of anticoagulants: Secondary | ICD-10-CM

## 2013-05-21 LAB — POCT INR: INR: 2

## 2013-06-04 ENCOUNTER — Ambulatory Visit (INDEPENDENT_AMBULATORY_CARE_PROVIDER_SITE_OTHER): Payer: Medicare Other | Admitting: *Deleted

## 2013-06-04 DIAGNOSIS — Z5181 Encounter for therapeutic drug level monitoring: Secondary | ICD-10-CM

## 2013-06-04 DIAGNOSIS — Z7901 Long term (current) use of anticoagulants: Secondary | ICD-10-CM

## 2013-06-04 DIAGNOSIS — I4891 Unspecified atrial fibrillation: Secondary | ICD-10-CM

## 2013-06-04 LAB — POCT INR: INR: 3

## 2013-06-18 ENCOUNTER — Ambulatory Visit (INDEPENDENT_AMBULATORY_CARE_PROVIDER_SITE_OTHER): Payer: Medicare Other | Admitting: *Deleted

## 2013-06-18 DIAGNOSIS — I4891 Unspecified atrial fibrillation: Secondary | ICD-10-CM

## 2013-06-18 DIAGNOSIS — Z7901 Long term (current) use of anticoagulants: Secondary | ICD-10-CM

## 2013-06-18 DIAGNOSIS — Z5181 Encounter for therapeutic drug level monitoring: Secondary | ICD-10-CM

## 2013-06-18 LAB — POCT INR: INR: 2

## 2013-06-21 ENCOUNTER — Other Ambulatory Visit: Payer: Self-pay

## 2013-06-21 DIAGNOSIS — K219 Gastro-esophageal reflux disease without esophagitis: Secondary | ICD-10-CM

## 2013-06-21 MED ORDER — PANTOPRAZOLE SODIUM 40 MG PO TBEC: DELAYED_RELEASE_TABLET | ORAL | Status: DC

## 2013-07-09 ENCOUNTER — Ambulatory Visit (INDEPENDENT_AMBULATORY_CARE_PROVIDER_SITE_OTHER): Payer: Medicare Other | Admitting: Pharmacist

## 2013-07-09 DIAGNOSIS — I4891 Unspecified atrial fibrillation: Secondary | ICD-10-CM

## 2013-07-09 DIAGNOSIS — Z7901 Long term (current) use of anticoagulants: Secondary | ICD-10-CM

## 2013-07-09 DIAGNOSIS — Z5181 Encounter for therapeutic drug level monitoring: Secondary | ICD-10-CM

## 2013-07-09 LAB — POCT INR: INR: 2.2

## 2013-08-09 ENCOUNTER — Ambulatory Visit (INDEPENDENT_AMBULATORY_CARE_PROVIDER_SITE_OTHER): Payer: Medicare Other

## 2013-08-09 DIAGNOSIS — I4891 Unspecified atrial fibrillation: Secondary | ICD-10-CM

## 2013-08-09 DIAGNOSIS — Z5181 Encounter for therapeutic drug level monitoring: Secondary | ICD-10-CM

## 2013-08-09 DIAGNOSIS — Z7901 Long term (current) use of anticoagulants: Secondary | ICD-10-CM

## 2013-08-09 LAB — POCT INR: INR: 2.4

## 2013-09-06 ENCOUNTER — Ambulatory Visit (INDEPENDENT_AMBULATORY_CARE_PROVIDER_SITE_OTHER): Payer: Medicare Other | Admitting: *Deleted

## 2013-09-06 DIAGNOSIS — Z5181 Encounter for therapeutic drug level monitoring: Secondary | ICD-10-CM

## 2013-09-06 DIAGNOSIS — I4891 Unspecified atrial fibrillation: Secondary | ICD-10-CM

## 2013-09-06 DIAGNOSIS — Z7901 Long term (current) use of anticoagulants: Secondary | ICD-10-CM

## 2013-09-06 LAB — POCT INR: INR: 3.3

## 2013-09-30 ENCOUNTER — Other Ambulatory Visit: Payer: Self-pay

## 2013-09-30 MED ORDER — POLYETHYLENE GLYCOL 3350 17 G PO PACK: 17.0000 g | PACK | Freq: Every day | ORAL | Status: DC | PRN

## 2013-10-04 ENCOUNTER — Ambulatory Visit (INDEPENDENT_AMBULATORY_CARE_PROVIDER_SITE_OTHER): Payer: Medicare Other

## 2013-10-04 DIAGNOSIS — I4891 Unspecified atrial fibrillation: Secondary | ICD-10-CM

## 2013-10-04 DIAGNOSIS — Z7901 Long term (current) use of anticoagulants: Secondary | ICD-10-CM

## 2013-10-04 DIAGNOSIS — Z5181 Encounter for therapeutic drug level monitoring: Secondary | ICD-10-CM

## 2013-10-04 LAB — POCT INR: INR: 1.8

## 2013-11-01 ENCOUNTER — Ambulatory Visit (INDEPENDENT_AMBULATORY_CARE_PROVIDER_SITE_OTHER): Payer: Medicare Other | Admitting: *Deleted

## 2013-11-01 DIAGNOSIS — Z7901 Long term (current) use of anticoagulants: Secondary | ICD-10-CM

## 2013-11-01 DIAGNOSIS — I4891 Unspecified atrial fibrillation: Secondary | ICD-10-CM

## 2013-11-01 DIAGNOSIS — Z5181 Encounter for therapeutic drug level monitoring: Secondary | ICD-10-CM

## 2013-11-01 DIAGNOSIS — Z23 Encounter for immunization: Secondary | ICD-10-CM

## 2013-11-01 LAB — POCT INR: INR: 2.8

## 2013-11-03 ENCOUNTER — Other Ambulatory Visit: Payer: Self-pay

## 2013-11-03 MED ORDER — METOPROLOL SUCCINATE ER 25 MG PO TB24: 12.5000 mg | ORAL_TABLET | Freq: Every day | ORAL | Status: DC

## 2013-11-08 ENCOUNTER — Other Ambulatory Visit: Payer: Self-pay | Admitting: Internal Medicine

## 2013-11-17 ENCOUNTER — Other Ambulatory Visit: Payer: Self-pay | Admitting: Cardiovascular Disease

## 2013-11-29 ENCOUNTER — Ambulatory Visit (INDEPENDENT_AMBULATORY_CARE_PROVIDER_SITE_OTHER): Payer: Medicare Other | Admitting: *Deleted

## 2013-11-29 DIAGNOSIS — Z5181 Encounter for therapeutic drug level monitoring: Secondary | ICD-10-CM

## 2013-11-29 DIAGNOSIS — I4891 Unspecified atrial fibrillation: Secondary | ICD-10-CM

## 2013-11-29 DIAGNOSIS — Z7901 Long term (current) use of anticoagulants: Secondary | ICD-10-CM

## 2013-11-29 LAB — POCT INR: INR: 3.8

## 2013-12-13 ENCOUNTER — Ambulatory Visit (INDEPENDENT_AMBULATORY_CARE_PROVIDER_SITE_OTHER): Payer: Medicare Other | Admitting: *Deleted

## 2013-12-13 DIAGNOSIS — Z5181 Encounter for therapeutic drug level monitoring: Secondary | ICD-10-CM

## 2013-12-13 DIAGNOSIS — Z7901 Long term (current) use of anticoagulants: Secondary | ICD-10-CM

## 2013-12-13 DIAGNOSIS — I4891 Unspecified atrial fibrillation: Secondary | ICD-10-CM

## 2013-12-13 LAB — POCT INR: INR: 2.6

## 2014-01-03 ENCOUNTER — Other Ambulatory Visit: Payer: Self-pay | Admitting: *Deleted

## 2014-01-03 MED ORDER — WARFARIN SODIUM 2.5 MG PO TABS: ORAL_TABLET | ORAL | Status: DC

## 2014-01-07 ENCOUNTER — Ambulatory Visit (INDEPENDENT_AMBULATORY_CARE_PROVIDER_SITE_OTHER): Payer: Medicare Other | Admitting: *Deleted

## 2014-01-07 DIAGNOSIS — Z5181 Encounter for therapeutic drug level monitoring: Secondary | ICD-10-CM

## 2014-01-07 DIAGNOSIS — Z7901 Long term (current) use of anticoagulants: Secondary | ICD-10-CM

## 2014-01-07 DIAGNOSIS — I4891 Unspecified atrial fibrillation: Secondary | ICD-10-CM

## 2014-01-07 LAB — POCT INR: INR: 2.4

## 2014-02-07 ENCOUNTER — Ambulatory Visit (INDEPENDENT_AMBULATORY_CARE_PROVIDER_SITE_OTHER): Payer: Medicare Other

## 2014-02-07 DIAGNOSIS — I4891 Unspecified atrial fibrillation: Secondary | ICD-10-CM

## 2014-02-07 DIAGNOSIS — Z7901 Long term (current) use of anticoagulants: Secondary | ICD-10-CM

## 2014-02-07 DIAGNOSIS — Z5181 Encounter for therapeutic drug level monitoring: Secondary | ICD-10-CM

## 2014-02-07 LAB — POCT INR: INR: 2.2

## 2014-02-16 ENCOUNTER — Other Ambulatory Visit: Payer: Self-pay | Admitting: Internal Medicine

## 2014-03-20 NOTE — Progress Notes (Signed)
Patient ID: Lindsey [name], female   DOB: 05/26/1923, 81 y.o.   MRN: [id number] 81 y.o.  female with a h/o afib and is on chronic coumadin anticoagulation. She has no prior h/o CAD and reports a normal stress test > 10 yrs ago. She was in her USOH until the morning of 10/24 when she began to experience non-radiating 10/10 epigastric pain described as sharp and constant. This was associated with diaphoresis and felt similar to GERD pain except was more severe. She took Maalox without relief and contacted EMS. Upon EMS arrival symptoms remained and her HR was in the 40's. En route to the ED, symptoms had eased off though they recurred following arrival in the ED. Initial troponins were normal and ECG was non-acute. CXR suggested mild edema. She was placed in observation for further evaluation. R/O with no further issues and D/C on home meds Still with some vestibular symptoms and dizzyness  Echo 11/15/11  Study Conclusions  - Left ventricle: The cavity size was normal. Wall thickness was normal. Systolic function was normal. The estimated ejection fraction was in the range of 60% to 65%. - Aortic valve: Mild regurgitation. - Left atrium: The atrium was mildly dilated.  Doing well with dyspnea Chronic pain on norco. Needs INR checked today  Has horrible spider veins in both legs and compression stockings are hard to put on  Has an osteophytic growth on medial aspect of right clavicle    ROS: Denies fever, malais, weight loss, blurry vision, decreased visual acuity, cough, sputum, SOB, hemoptysis, pleuritic pain, palpitaitons, heartburn, abdominal pain, melena, lower extremity edema, claudication, or rash.  All other systems reviewed and negative  General: Affect appropriate Healthy:  appears stated age HEENT: normal Neck supple with no adenopathy JVP normal no bruits no thyromegaly Lungs clear with no wheezing and good diaphragmatic motion Heart:  S1/S2 no murmur, no rub, gallop or click  osteophyte medial aspect of right collar bone  PMI normal Abdomen: benighn, BS positve, no tenderness, no AAA no bruit.  No HSM or HJR Distal pulses intact with no bruits Plus one bilateral edema with large spider veins  Neuro non-focal Skin warm and dry No muscular weakness   Current Outpatient Prescriptions  Medication Sig Dispense Refill  . amoxicillin (AMOXIL) 500 MG tablet Take 1 tablet (500 mg total) by mouth 3 (three) times daily. 21 tablet 0  . Calcium Carbonate-Vitamin D (CALTRATE 600+D) 600-400 MG-UNIT per tablet Take 1 tablet by mouth 2 (two) times daily.      . Cholecalciferol (VITAMIN D3) 2000 UNITS TABS Take 1 tablet by mouth daily.      . diazepam (VALIUM) 5 MG tablet Take 5 mg by mouth every 6 (six) hours as needed. For anxiety    . docusate sodium (COLACE) 100 MG capsule Take 100 mg by mouth daily.      . ferrous sulfate 325 (65 FE) MG tablet Take 325 mg by mouth daily with breakfast.      . gabapentin (NEURONTIN) 100 MG capsule Take 200 mg by mouth at bedtime.     [name] HYDROcodone-acetaminophen (NORCO) 5-325 MG per tablet Take 1 tablet by mouth every 5 (five) hours as needed. For pain    . metoprolol succinate (TOPROL-XL) 25 MG 24 hr tablet Take 0.5 tablets (12.5 mg total) by mouth daily. 45 tablet 3  . Multiple Vitamin (MULTIVITAMIN) tablet Take 1 tablet by mouth daily.      . nitroGLYCERIN (NITROSTAT) 0.4 MG SL tablet Place 1 tablet (  0.4 mg total) under the tongue every 5 (five) minutes x 3 doses as needed for chest pain. 25 tablet 3  . pantoprazole (PROTONIX) 40 MG tablet TAKE ONE TABLET BY MOUTH ONCE DAILY 90 tablet 0  . polyethylene glycol (MIRALAX / GLYCOLAX) packet Take 17 g by mouth daily as needed. For constipation 527 each 1  . polyethylene glycol powder (GLYCOLAX/MIRALAX) powder TAKE 17 GRAMS BY MOUTH DAILY AS NEEDED FOR CONSTIPATION. 527 g 5  . Probiotic Product (ALIGN) 4 MG CAPS Take 1 capsule by mouth daily.      [name]. pyridOXINE (VITAMIN B-6) 100 MG tablet Take  100 mg by mouth daily.      [name]. spironolactone (ALDACTONE) 25 MG tablet TAKE ONE TABLET BY MOUTH ONCE DAILY 90 tablet 1  . warfarin (COUMADIN) 2.5 MG tablet Take as directed by Coumadin clinic 30 tablet 3   No current facility-administered medications for this visit.    Allergies  Haloperidol lactate; Hydromorphone hcl; Ibuprofen; Melatonin; Morphine; Nortriptyline hcl; and Pentazocine lactate  Electrocardiogram:  afib rate 50 otherwise normal  02/11/12  02/15/13  Afib rate 77 normal no change from last year  03/21/14  afib rate 73  Otherwise normal   Assessment and Plan

## 2014-03-21 ENCOUNTER — Encounter: Payer: Self-pay | Admitting: Cardiovascular Disease

## 2014-03-21 ENCOUNTER — Ambulatory Visit (INDEPENDENT_AMBULATORY_CARE_PROVIDER_SITE_OTHER): Payer: Medicare Other | Admitting: Cardiovascular Disease

## 2014-03-21 ENCOUNTER — Ambulatory Visit (INDEPENDENT_AMBULATORY_CARE_PROVIDER_SITE_OTHER): Payer: Medicare Other

## 2014-03-21 VITALS — BP 144/82 | HR 73 | Ht 65.0 in | Wt 188.4 lb

## 2014-03-21 DIAGNOSIS — Z7901 Long term (current) use of anticoagulants: Secondary | ICD-10-CM

## 2014-03-21 DIAGNOSIS — I4891 Unspecified atrial fibrillation: Secondary | ICD-10-CM

## 2014-03-21 DIAGNOSIS — I482 Chronic atrial fibrillation, unspecified: Secondary | ICD-10-CM

## 2014-03-21 DIAGNOSIS — I872 Venous insufficiency (chronic) (peripheral): Secondary | ICD-10-CM

## 2014-03-21 DIAGNOSIS — M810 Age-related osteoporosis without current pathological fracture: Secondary | ICD-10-CM

## 2014-03-21 DIAGNOSIS — Z5181 Encounter for therapeutic drug level monitoring: Secondary | ICD-10-CM

## 2014-03-21 DIAGNOSIS — I10 Essential (primary) hypertension: Secondary | ICD-10-CM

## 2014-03-21 LAB — POCT INR: INR: 3.3

## 2014-03-21 MED ORDER — SPIRONOLACTONE 25 MG PO TABS: 25.0000 mg | ORAL_TABLET | Freq: Every day | ORAL | Status: DC

## 2014-03-21 MED ORDER — METOPROLOL SUCCINATE ER 25 MG PO TB24: 12.5000 mg | ORAL_TABLET | Freq: Every day | ORAL | Status: DC

## 2014-03-21 NOTE — Assessment & Plan Note (Signed)
Kyphotic with restrictive lung disease has ostiophytic growth on medial aspect of right clavicle  Would appear to be benign But occasionally painful

## 2014-03-21 NOTE — Patient Instructions (Signed)
Your physician wants you to follow-up in: YEAR WITH DR NISHAN  You will receive a reminder letter in the mail two months in advance. If you don't receive a letter, please call our office to schedule the follow-up appointment.  Your physician recommends that you continue on your current medications as directed. Please refer to the Current Medication list given to you today. 

## 2014-03-21 NOTE — Addendum Note (Signed)
Addended by: Scherrie BatemanYORK, Anyia Gierke E on: 03/21/2014 11:39 AM   Modules accepted: Orders

## 2014-03-21 NOTE — Assessment & Plan Note (Signed)
Well controlled.  Continue current medications and low sodium Dash type diet.   Refills called into [facility]

## 2014-03-21 NOTE — Assessment & Plan Note (Signed)
Good rate control and anticoagulation.  Using walking for stability and no falls

## 2014-03-21 NOTE — Assessment & Plan Note (Signed)
Chronic  Continue diuretic Unable to wear support hose.  Low sodium diet

## 2014-04-11 ENCOUNTER — Ambulatory Visit (INDEPENDENT_AMBULATORY_CARE_PROVIDER_SITE_OTHER): Payer: Medicare Other

## 2014-04-11 DIAGNOSIS — Z7901 Long term (current) use of anticoagulants: Secondary | ICD-10-CM | POA: Diagnosis not present

## 2014-04-11 DIAGNOSIS — I4891 Unspecified atrial fibrillation: Secondary | ICD-10-CM

## 2014-04-11 DIAGNOSIS — Z5181 Encounter for therapeutic drug level monitoring: Secondary | ICD-10-CM | POA: Diagnosis not present

## 2014-04-11 LAB — POCT INR: INR: 1.9

## 2014-05-01 ENCOUNTER — Encounter (HOSPITAL_COMMUNITY): Payer: Self-pay | Admitting: Emergency Medicine

## 2014-05-01 ENCOUNTER — Inpatient Hospital Stay (HOSPITAL_COMMUNITY)
Admission: EM | Admit: 2014-05-01 | Discharge: 2014-05-03 | DRG: 379 | Disposition: A | Discharge status: Home / Self Care | Payer: Medicare Other | Attending: Internal Medicine | Admitting: Internal Medicine

## 2014-05-01 ENCOUNTER — Other Ambulatory Visit (HOSPITAL_COMMUNITY): Payer: Self-pay

## 2014-05-01 DIAGNOSIS — E119 Type 2 diabetes mellitus without complications: Secondary | ICD-10-CM

## 2014-05-01 DIAGNOSIS — K219 Gastro-esophageal reflux disease without esophagitis: Secondary | ICD-10-CM | POA: Diagnosis present

## 2014-05-01 DIAGNOSIS — I10 Essential (primary) hypertension: Secondary | ICD-10-CM | POA: Diagnosis present

## 2014-05-01 DIAGNOSIS — M1990 Unspecified osteoarthritis, unspecified site: Secondary | ICD-10-CM | POA: Diagnosis present

## 2014-05-01 DIAGNOSIS — F4323 Adjustment disorder with mixed anxiety and depressed mood: Secondary | ICD-10-CM | POA: Diagnosis not present

## 2014-05-01 DIAGNOSIS — Z9049 Acquired absence of other specified parts of digestive tract: Secondary | ICD-10-CM | POA: Diagnosis present

## 2014-05-01 DIAGNOSIS — H9190 Unspecified hearing loss, unspecified ear: Secondary | ICD-10-CM | POA: Diagnosis present

## 2014-05-01 DIAGNOSIS — Z7901 Long term (current) use of anticoagulants: Secondary | ICD-10-CM

## 2014-05-01 DIAGNOSIS — Z96649 Presence of unspecified artificial hip joint: Secondary | ICD-10-CM | POA: Diagnosis present

## 2014-05-01 DIAGNOSIS — I4891 Unspecified atrial fibrillation: Secondary | ICD-10-CM

## 2014-05-01 DIAGNOSIS — K921 Melena: Principal | ICD-10-CM | POA: Diagnosis present

## 2014-05-01 DIAGNOSIS — I482 Chronic atrial fibrillation, unspecified: Secondary | ICD-10-CM | POA: Diagnosis present

## 2014-05-01 DIAGNOSIS — M810 Age-related osteoporosis without current pathological fracture: Secondary | ICD-10-CM | POA: Diagnosis present

## 2014-05-01 DIAGNOSIS — Z79891 Long term (current) use of opiate analgesic: Secondary | ICD-10-CM | POA: Diagnosis not present

## 2014-05-01 DIAGNOSIS — Z884 Allergy status to anesthetic agent status: Secondary | ICD-10-CM | POA: Diagnosis not present

## 2014-05-01 DIAGNOSIS — K625 Hemorrhage of anus and rectum: Secondary | ICD-10-CM

## 2014-05-01 DIAGNOSIS — F419 Anxiety disorder, unspecified: Secondary | ICD-10-CM | POA: Diagnosis present

## 2014-05-01 DIAGNOSIS — Z888 Allergy status to other drugs, medicaments and biological substances status: Secondary | ICD-10-CM

## 2014-05-01 DIAGNOSIS — E559 Vitamin D deficiency, unspecified: Secondary | ICD-10-CM | POA: Diagnosis present

## 2014-05-01 DIAGNOSIS — K5900 Constipation, unspecified: Secondary | ICD-10-CM | POA: Diagnosis present

## 2014-05-01 DIAGNOSIS — K648 Other hemorrhoids: Secondary | ICD-10-CM | POA: Diagnosis present

## 2014-05-01 DIAGNOSIS — Z90710 Acquired absence of both cervix and uterus: Secondary | ICD-10-CM

## 2014-05-01 DIAGNOSIS — K922 Gastrointestinal hemorrhage, unspecified: Secondary | ICD-10-CM | POA: Diagnosis not present

## 2014-05-01 DIAGNOSIS — E785 Hyperlipidemia, unspecified: Secondary | ICD-10-CM | POA: Diagnosis present

## 2014-05-01 DIAGNOSIS — Z885 Allergy status to narcotic agent status: Secondary | ICD-10-CM

## 2014-05-01 DIAGNOSIS — Z66 Do not resuscitate: Secondary | ICD-10-CM | POA: Diagnosis present

## 2014-05-01 DIAGNOSIS — D649 Anemia, unspecified: Secondary | ICD-10-CM | POA: Diagnosis present

## 2014-05-01 HISTORY — DX: Restless legs syndrome: G25.81

## 2014-05-01 LAB — POC OCCULT BLOOD, ED: FECAL OCCULT BLD: POSITIVE — AB

## 2014-05-01 LAB — CBC
HCT: 32.8 % — ABNORMAL LOW (ref 36.0–46.0)
HCT: 31.9 % — ABNORMAL LOW (ref 36.0–46.0)
HEMOGLOBIN: 10.7 g/dL — AB (ref 12.0–15.0)
Hemoglobin: 10.4 g/dL — ABNORMAL LOW (ref 12.0–15.0)
MCH: 31.9 pg (ref 26.0–34.0)
MCH: 31.9 pg (ref 26.0–34.0)
MCHC: 32.6 g/dL (ref 30.0–36.0)
MCHC: 32.6 g/dL (ref 30.0–36.0)
MCV: 97.9 fL (ref 78.0–100.0)
MCV: 97.9 fL (ref 78.0–100.0)
PLATELETS: 275 10*3/uL (ref 150–400)
Platelets: 281 10*3/uL (ref 150–400)
RBC: 3.35 MIL/uL — ABNORMAL LOW (ref 3.87–5.11)
RBC: 3.26 MIL/uL — ABNORMAL LOW (ref 3.87–5.11)
RDW: 13 % (ref 11.5–15.5)
RDW: 13 % (ref 11.5–15.5)
WBC: 8.4 10*3/uL (ref 4.0–10.5)
WBC: 7 10*3/uL (ref 4.0–10.5)

## 2014-05-01 LAB — TYPE AND SCREEN
ABO/RH(D): O POS
ANTIBODY SCREEN: NEGATIVE

## 2014-05-01 LAB — COMPREHENSIVE METABOLIC PANEL
ALBUMIN: 3.6 g/dL (ref 3.5–5.2)
ALK PHOS: 76 U/L (ref 39–117)
ALT: 18 U/L (ref 0–35)
ANION GAP: 8 (ref 5–15)
AST: 29 U/L (ref 0–37)
BUN: 13 mg/dL (ref 6–23)
CHLORIDE: 96 mmol/L (ref 96–112)
CO2: 30 mmol/L (ref 19–32)
Calcium: 8.9 mg/dL (ref 8.4–10.5)
Creatinine, Ser: 1.18 mg/dL — ABNORMAL HIGH (ref 0.50–1.10)
GFR calc Af Amer: 46 mL/min — ABNORMAL LOW (ref >90)
GFR calc non Af Amer: 40 mL/min — ABNORMAL LOW (ref >90)
Glucose, Bld: 117 mg/dL — ABNORMAL HIGH (ref 70–99)
POTASSIUM: 3.8 mmol/L (ref 3.5–5.1)
Sodium: 134 mmol/L — ABNORMAL LOW (ref 135–145)
TOTAL PROTEIN: 7.1 g/dL (ref 6.0–8.3)
Total Bilirubin: 0.7 mg/dL (ref 0.3–1.2)

## 2014-05-01 LAB — GLUCOSE, CAPILLARY
GLUCOSE-CAPILLARY: 121 mg/dL — AB (ref 70–99)
GLUCOSE-CAPILLARY: 116 mg/dL — AB (ref 70–99)
GLUCOSE-CAPILLARY: 126 mg/dL — AB (ref 70–99)
Glucose-Capillary: 110 mg/dL — ABNORMAL HIGH (ref 70–99)

## 2014-05-01 LAB — PROTIME-INR
INR: 2.06 — AB (ref 0.00–1.49)
PROTHROMBIN TIME: 23.4 s — AB (ref 11.6–15.2)

## 2014-05-01 LAB — TROPONIN I

## 2014-05-01 MED ORDER — DOCUSATE SODIUM 100 MG PO CAPS
100.0000 mg | ORAL_CAPSULE | Freq: Every day | ORAL | Status: DC
  Administered 2014-05-01 – 2014-05-03 (×3): 100 mg via ORAL
  Filled 2014-05-01 (×3): qty 1

## 2014-05-01 MED ORDER — INSULIN ASPART 100 UNIT/ML ~~LOC~~ SOLN
0.0000 [IU] | Freq: Three times a day (TID) | SUBCUTANEOUS | Status: DC
  Administered 2014-05-01: 1 [IU] via SUBCUTANEOUS

## 2014-05-01 MED ORDER — POLYETHYLENE GLYCOL 3350 17 G PO PACK
17.0000 g | PACK | Freq: Two times a day (BID) | ORAL | Status: DC
  Administered 2014-05-01 – 2014-05-03 (×5): 17 g via ORAL
  Filled 2014-05-01 (×6): qty 1

## 2014-05-01 MED ORDER — SODIUM CHLORIDE 0.9 % IV SOLN
INTRAVENOUS | Status: DC
  Administered 2014-05-01 – 2014-05-02 (×2): via INTRAVENOUS

## 2014-05-01 MED ORDER — DIAZEPAM 5 MG PO TABS
5.0000 mg | ORAL_TABLET | Freq: Three times a day (TID) | ORAL | Status: DC | PRN
  Administered 2014-05-01 – 2014-05-03 (×4): 5 mg via ORAL
  Filled 2014-05-01 (×4): qty 1

## 2014-05-01 MED ORDER — GABAPENTIN 100 MG PO CAPS
200.0000 mg | ORAL_CAPSULE | Freq: Every day | ORAL | Status: DC
  Administered 2014-05-01 – 2014-05-02 (×2): 200 mg via ORAL
  Filled 2014-05-01 (×3): qty 2

## 2014-05-01 MED ORDER — METOPROLOL SUCCINATE 12.5 MG HALF TABLET: 12.5000 mg | ORAL_TABLET | Freq: Every day | ORAL | Status: DC

## 2014-05-01 MED ORDER — SODIUM CHLORIDE 0.9 % IJ SOLN
3.0000 mL | Freq: Two times a day (BID) | INTRAMUSCULAR | Status: DC
  Administered 2014-05-01 – 2014-05-03 (×3): 3 mL via INTRAVENOUS

## 2014-05-01 MED ORDER — HYDROCODONE-ACETAMINOPHEN 5-325 MG PO TABS
1.0000 | ORAL_TABLET | ORAL | Status: DC | PRN
  Administered 2014-05-01 – 2014-05-03 (×8): 1 via ORAL
  Filled 2014-05-01 (×8): qty 1

## 2014-05-01 MED ORDER — ALUM & MAG HYDROXIDE-SIMETH 200-200-20 MG/5ML PO SUSP
30.0000 mL | Freq: Four times a day (QID) | ORAL | Status: DC | PRN
  Administered 2014-05-01 – 2014-05-02 (×2): 30 mL via ORAL
  Filled 2014-05-01 (×2): qty 30

## 2014-05-01 NOTE — ED Notes (Signed)
Daughter at the bedside. Dr. Fayrene FearingJames at the bedside

## 2014-05-01 NOTE — Progress Notes (Signed)
Unassigned Consult  Reason for Consult: Hematochezia Referring Physician: [facility]  Gardiner Ramus Lottman HPI: This is an 81 year old female with a PMH of afib on coumadin, s/p gastrojejunostomy, HTN, and hyperlipidemia who presents to the hospital with a worsening of her hematochezia.  She reports having painless hematochezia for the past several days, but last evening the volume of bleeding appeared to have worsened.  As a result she presented to the ER.  Surprisingly her HGB has remained relatively stable in the 10-11 range.  No complaints of any chest pain or SOB.  She had a CT scan of the ABM/Pelvis in 2008 for complaints of abdominal pain, but there was no evidence of diverticulitis.  In fact, she did not have substantial diverticula, per the report.  A possible narrowing in her colon was noted at the splenic flexure during that examination.  She recalls having a colonoscopy a number of years ago, but she cannot recall the results or the name of the physician.  Past Medical History  Diagnosis Date  . Atrial fibrillation     a. with bradycardia - remains on bb;  b. chronic coumadin.  . Diverticulitis 2003-2008  . GERD (gastroesophageal reflux disease)   . Osteoporosis     T score -3.9 @ forearm  . Vitamin D deficiency     Vitamin D 27 in 2010  . Anemia     PMH  of  . Duodenal ulcer 1976    surgery   . 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 Despina Hick  . Hx of colonoscopy 02/2006    Internal hemorrhoids  . Nipple discharge   . Arthritis   . Hemorrhoids   . Venous stasis     a. bilat LE w/ variscosities.  [name] Hearing loss   . Syncope     a. 2003 - felt to be vasovagal;  b. 06/2001 Echo: EF 55-60%, no rwma;    Past Surgical History  Procedure Laterality Date  . Appendectomy    . Cholecystectomy    . Tonsillectomy and adenoidectomy    . Total hip arthroplasty  2010    transfusions  . Breast biopsy  11/05/10    breast lump removed- rt br  . Laparoscopic gastrotomy  w/ repair of ulcer    . Abdominal hysterectomy      Family History  Problem Relation Age of Onset  . Emphysema Father 11    deceased  . Other Mother 43    deceased old age  . Diverticulosis Brother   . Heart disease Brother   . Coronary artery disease Brother     S/P CABG  . Breast cancer Sister   . Cancer Sister     breast    Social History:  reports that she has never smoked. She has never used smokeless tobacco. She reports that she does not drink alcohol or use illicit drugs.  Allergies:  Allergies  Allergen Reactions  . Haloperidol Lactate   . Hydromorphone Hcl     REACTION: SICK ON STOMACH  . Ibuprofen   . Melatonin   . Morphine   . Nortriptyline Hcl   . Pentazocine Lactate     Medications:  Scheduled: . docusate sodium  100 mg Oral Daily  . gabapentin  200 mg Oral QHS  . insulin aspart  0-9 Units Subcutaneous TID WC  . [START ON 05/02/2014] metoprolol succinate  12.5 mg Oral Daily  . polyethylene glycol  17 g Oral BID  . sodium chloride  3 mL Intravenous Q12H   Continuous: . sodium chloride 50 mL/hr at 05/01/14 1019    Results for orders placed or performed during the hospital encounter of 05/01/14 (from the past 24 hour(s))  Type and screen     Status: None   Collection Time: 05/01/14  7:24 AM  Result Value Ref Range   ABO/RH(D) O POS    Antibody Screen NEG    Sample Expiration 05/04/2014   Protime-INR (if patient is taking Coumadin)     Status: Abnormal   Collection Time: 05/01/14  7:29 AM  Result Value Ref Range   Prothrombin Time 23.4 (H) 11.6 - 15.2 seconds   INR 2.06 (H) 0.00 - 1.49  CBC     Status: Abnormal   Collection Time: 05/01/14  7:29 AM  Result Value Ref Range   WBC 8.4 4.0 - 10.5 K/uL   RBC 3.35 (L) 3.87 - 5.11 MIL/uL   Hemoglobin 10.7 (L) 12.0 - 15.0 g/dL   HCT 16.1 (L) 09.6 - 04.5 %   MCV 97.9 78.0 - 100.0 fL   MCH 31.9 26.0 - 34.0 pg   MCHC 32.6 30.0 - 36.0 g/dL   RDW 40.9 81.1 - 91.4 %   Platelets 281 150 - 400 K/uL   Comprehensive metabolic panel     Status: Abnormal   Collection Time: 05/01/14  7:29 AM  Result Value Ref Range   Sodium 134 (L) 135 - 145 mmol/L   Potassium 3.8 3.5 - 5.1 mmol/L   Chloride 96 96 - 112 mmol/L   CO2 30 19 - 32 mmol/L   Glucose, Bld 117 (H) 70 - 99 mg/dL   BUN 13 6 - 23 mg/dL   Creatinine, Ser 7.82 (H) 0.50 - 1.10 mg/dL   Calcium 8.9 8.4 - 95.6 mg/dL   Total Protein 7.1 6.0 - 8.3 g/dL   Albumin 3.6 3.5 - 5.2 g/dL   AST 29 0 - 37 U/L   ALT 18 0 - 35 U/L   Alkaline Phosphatase 76 39 - 117 U/L   Total Bilirubin 0.7 0.3 - 1.2 mg/dL   GFR calc non Af Amer 40 (L) >90 mL/min   GFR calc Af Amer 46 (L) >90 mL/min   Anion gap 8 5 - 15  POC occult blood, ED     Status: Abnormal   Collection Time: 05/01/14  7:56 AM  Result Value Ref Range   Fecal Occult Bld POSITIVE (A) NEGATIVE  Glucose, capillary     Status: Abnormal   Collection Time: 05/01/14 10:16 AM  Result Value Ref Range   Glucose-Capillary 110 (H) 70 - 99 mg/dL     No results found.  ROS:  As stated above in the HPI otherwise negative.  Blood pressure 127/71, pulse 62, temperature 98.1 F (36.7 C), temperature source Oral, resp. rate 18, height  (1.651 m), weight 85.2 kg (187 lb 13.3 oz), SpO2 98 %.    PE: Gen: NAD, Alert and Oriented HEENT:  [place]/AT, EOMI Neck: Supple, no LAD Lungs: CTA Bilaterally CV: RRR without M/G/R ABM: Soft, NTND, +BS Ext: No C/C/E Rectal: No masses, trace fleck of blood  Assessment/Plan: 1) Hematochezia - ? source 2) Anemia.   With further discussion it appears that her bleeding was very minor.  It may be from her hemorrhoids versus a diverticular source.  At this time she wants to avoid a colonoscopy.  If she does not have any further bleeding today and her HGB is stable she can be  discharged home.  Plan: 1) Follow HGB. 2) Continue with supportive care.  Nimo Verastegui D  05/01/2014, 11:22 AM

## 2014-05-01 NOTE — ED Provider Notes (Signed)
CSN: [id number]     Arrival date & time 05/01/14  0710 History   First MD Initiated Contact with Patient 05/01/14 [phone]     Chief Complaint  Patient presents with  . Rectal Bleeding      HPI  Patient presents for evaluation of rectal bleeding. History of permanent A. fib. Intracranial bleed with Coumadin. Last INR was slightly low month ago. Given an extra dose of her Coumadin, but no change in her baseline dosage. Also history of known diverticula on previous colonoscopy. Episode of rectal bleeding after bowel movement 6 days ago. Intermittent episodes of blood rectally since then. No bowel movement for 4-5 days. Painless moderate volume bright red blood per rectum this morning she presents here. No pain, not lightheaded or weak or syncopal.  Past Medical History  Diagnosis Date  . Atrial fibrillation     a. with bradycardia - remains on bb;  b. chronic coumadin.  . Diverticulitis 2003-2008  . GERD (gastroesophageal reflux disease)   . Osteoporosis     T score -3.9 @ forearm  . Vitamin D deficiency     Vitamin D 27 in 2010  . Anemia     PMH  of  . Duodenal ulcer 1976    surgery   . 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 Despina Hick  . Hx of colonoscopy 02/2006    Internal hemorrhoids  . Nipple discharge   . Arthritis   . Hemorrhoids   . Venous stasis     a. bilat LE w/ variscosities.  [name] Hearing loss   . Syncope     a. 2003 - felt to be vasovagal;  b. 06/2001 Echo: EF 55-60%, no rwma;   Past Surgical History  Procedure Laterality Date  . Appendectomy    . Cholecystectomy    . Tonsillectomy and adenoidectomy    . Total hip arthroplasty  2010    transfusions  . Breast biopsy  11/05/10    breast lump removed- rt br  . Laparoscopic gastrotomy w/ repair of ulcer    . Abdominal hysterectomy     Family History  Problem Relation Age of Onset  . Emphysema Father 53    deceased  . Other Mother 81    deceased old age  . Diverticulosis Brother   . Heart disease Brother    . Coronary artery disease Brother     S/P CABG  . Breast cancer Sister   . Cancer Sister     breast   History  Substance Use Topics  . Smoking status: Never Smoker   . Smokeless tobacco: Never Used  . Alcohol Use: No   OB History    No data available     Review of Systems  Constitutional: Negative for fever, chills, diaphoresis, appetite change and fatigue.  HENT: Negative for mouth sores, sore throat and trouble swallowing.   Eyes: Negative for visual disturbance.  Respiratory: Negative for cough, chest tightness, shortness of breath and wheezing.   Cardiovascular: Negative for chest pain.  Gastrointestinal: Positive for constipation and anal bleeding. Negative for nausea, vomiting, abdominal pain, diarrhea and abdominal distention.  Endocrine: Negative for polydipsia, polyphagia and polyuria.  Genitourinary: Negative for dysuria, frequency and hematuria.  Musculoskeletal: Negative for gait problem.  Skin: Negative for color change, pallor and rash.  Neurological: Negative for dizziness, syncope, light-headedness and headaches.  Hematological: Does not bruise/bleed easily.  Psychiatric/Behavioral: Negative for behavioral problems and confusion.      Allergies  Haloperidol lactate; Hydromorphone hcl;  Ibuprofen; Melatonin; Morphine; Nortriptyline hcl; and Pentazocine lactate  Home Medications   Prior to Admission medications   Medication Sig Start Date End Date Taking? Authorizing Provider  amoxicillin (AMOXIL) 500 MG tablet Take 1 tablet (500 mg total) by mouth 3 (three) times daily. 02/07/12   Pecola LawlessWilliam F Hopper, MD  Calcium Carbonate-Vitamin D (CALTRATE 600+D) 600-400 MG-UNIT per tablet Take 1 tablet by mouth 2 (two) times daily.      Historical Provider, MD  Cholecalciferol (VITAMIN D3) 2000 UNITS TABS Take 1 tablet by mouth daily.      Historical Provider, MD  diazepam (VALIUM) 5 MG tablet Take 5 mg by mouth every 6 (six) hours as needed. For anxiety    Historical  Provider, MD  docusate sodium (COLACE) 100 MG capsule Take 100 mg by mouth daily.      Historical Provider, MD  ferrous sulfate 325 (65 FE) MG tablet Take 325 mg by mouth daily with breakfast.      Historical Provider, MD  gabapentin (NEURONTIN) 100 MG capsule Take 200 mg by mouth at bedtime.     Historical Provider, MD  HYDROcodone-acetaminophen (NORCO) 5-325 MG per tablet Take 1 tablet by mouth every 5 (five) hours as needed. For pain    Historical Provider, MD  metoprolol succinate (TOPROL-XL) 25 MG 24 hr tablet Take 0.5 tablets (12.5 mg total) by mouth daily. 03/21/14   Wendall StadePeter C Nishan, MD  Multiple Vitamin (MULTIVITAMIN) tablet Take 1 tablet by mouth daily.      Historical Provider, MD  nitroGLYCERIN (NITROSTAT) 0.4 MG SL tablet Place 1 tablet (0.4 mg total) under the tongue every 5 (five) minutes x 3 doses as needed for chest pain. 11/15/11   Ok Anishristopher R Berge, NP  pantoprazole (PROTONIX) 40 MG tablet TAKE ONE TABLET BY MOUTH ONCE DAILY 02/16/14   Pecola LawlessWilliam F Hopper, MD  polyethylene glycol powder (GLYCOLAX/MIRALAX) powder TAKE 17 GRAMS BY MOUTH DAILY AS NEEDED FOR CONSTIPATION. 08/25/12   Pecola LawlessWilliam F Hopper, MD  Probiotic Product (ALIGN) 4 MG CAPS Take 1 capsule by mouth daily.      Historical Provider, MD  pyridOXINE (VITAMIN B-6) 100 MG tablet Take 100 mg by mouth daily.      Historical Provider, MD  spironolactone (ALDACTONE) 25 MG tablet Take 1 tablet (25 mg total) by mouth daily. 03/21/14   Wendall StadePeter C Nishan, MD  warfarin (COUMADIN) 2.5 MG tablet Take as directed by Coumadin clinic 01/03/14   Wendall StadePeter C Nishan, MD   Ht 5' (1.524 m)  Wt 183 lb (83.008 kg)  BMI 35.74 kg/m2  SpO2 97% Physical Exam  Constitutional: She is oriented to person, place, and time. She appears well-developed and well-nourished. No distress.  HENT:  Head: Normocephalic.  Conjunctiva, gingiva, and nailbeds do not appear pale.  Eyes: Conjunctivae are normal. Pupils are equal, round, and reactive to light. No scleral icterus.   Neck: Normal range of motion. Neck supple. No thyromegaly present.  Cardiovascular: Normal rate.  An irregularly irregular rhythm present. Exam reveals no gallop and no friction rub.   No murmur heard. Rate controlled A. fib on the monitor.  Pulmonary/Chest: Effort normal and breath sounds normal. No respiratory distress. She has no wheezes. She has no rales.  Abdominal: Soft. Bowel sounds are normal. She exhibits no distension. There is no tenderness. There is no rebound.  Soft benign abdomen. Not distended. Rectal exam shows very small noninflamed nonbleeding external hemorrhoid. No palpable rectal mass to suggest internal hemorrhoid. No fissures.  Musculoskeletal: Normal range  of motion.  Neurological: She is alert and oriented to person, place, and time.  Skin: Skin is warm and dry. No rash noted.  Psychiatric: She has a normal mood and affect. Her behavior is normal.    ED Course  Procedures (including critical care time) Labs Review Labs Reviewed - No data to display  Imaging Review No results found.   EKG Interpretation None      MDM   Final diagnoses:  None    Patient anticoagulated with Coumadin. Most recent INR was subtherapeutic at 1.93 weeks ago. Given single extra dose Coumadin. Has not been rechecked. No signs of easy bruising or coagulopathy. Painless bright red rectal bleeding and history of internal hemorrhoids and diverticuli.  His initial hemoglobin. Serial hemoglobins. INR evaluation. Consideration for reversal of her warfarin coagulation. Undergone type and screen here. Remains hemodynamically stable and relatively symptomatic.    Rolland Porter, MD 05/01/14 [phone]

## 2014-05-01 NOTE — H&P (Signed)
History and Physical   Lindsey [name] ZOX:[id number] DOB: 08-23-1923 DOA: 05/01/2014  Referring physician: Dr. Fayrene Fearing PCP: Marga Melnick, MD  Specialists: GI  Chief Complaint: rectal bleed  HPI: Lindsey [name] is a 81 y.o. female has a past medical history significant for A fib on chronic anticoagulation with Coumadin, hypertension, hyperlipidemia, presents to the emergency room with a chief complaint of bright red blood per rectum. She's been having intermittent bleeding with her stools over the last about 5 days, coming and going. This morning she felt like the bleed was so severe and she felt like the quantity was large that needed to come to the emergency room. Patient denies any abdominal pain, nausea or vomiting. She endorses constipation, she takes Norco for back pain. She denies any chest pain, she denies any shortness of breath, she denies any cough or chest congestion. She denies any fever or chills. She denies any lightheadedness or dizziness. She denies any numbness or tingling. She denies any weight gain, she denies any lower strength is swelling. She reports that this bleeding has never happened to her before. She reports a colonoscopy "years back" and doesn't remember were or the name of the doctor. In the emergency room, patient's vital signs are stable, her telemetry looks like she is in A. fib rate controlled, her INR is therapeutic at 2.06, she has mild decrease in her hemoglobin to 10.7 from prior values of 11.3. [facility] asked for admission for rectal bleed.  Review of Systems: As per history of present illness, otherwise 10 point review of system negative  Past Medical History  Diagnosis Date  . Atrial fibrillation     a. with bradycardia - remains on bb;  b. chronic coumadin.  . Diverticulitis 2003-2008  . GERD (gastroesophageal reflux disease)   . Osteoporosis     T score -3.9 @ forearm  . Vitamin D deficiency     Vitamin D 27 in 2010  . Anemia     PMH  of  .  Duodenal ulcer 1976    surgery   . 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 Despina Hick  . Hx of colonoscopy 02/2006    Internal hemorrhoids  . Nipple discharge   . Arthritis   . Hemorrhoids   . Venous stasis     a. bilat LE w/ variscosities.  [name] Hearing loss   . Syncope     a. 2003 - felt to be vasovagal;  b. 06/2001 Echo: EF 55-60%, no rwma;   Past Surgical History  Procedure Laterality Date  . Appendectomy    . Cholecystectomy    . Tonsillectomy and adenoidectomy    . Total hip arthroplasty  2010    transfusions  . Breast biopsy  11/05/10    breast lump removed- rt br  . Laparoscopic gastrotomy w/ repair of ulcer    . Abdominal hysterectomy     Social History:  reports that she has never smoked. She has never used smokeless tobacco. She reports that she does not drink alcohol or use illicit drugs.  Allergies  Allergen Reactions  . Haloperidol Lactate   . Hydromorphone Hcl     REACTION: SICK ON STOMACH  . Ibuprofen   . Melatonin   . Morphine   . Nortriptyline Hcl   . Pentazocine Lactate     Family History  Problem Relation Age of Onset  . Emphysema Father 31    deceased  . Other Mother 70    deceased old age  .  Diverticulosis Brother   . Heart disease Brother   . Coronary artery disease Brother     S/P CABG  . Breast cancer Sister   . Cancer Sister     breast    Prior to Admission medications   Medication Sig Start Date End Date Taking? Authorizing Provider  Calcium Carbonate-Vitamin D (CALTRATE 600+D) 600-400 MG-UNIT per tablet Take 1 tablet by mouth 2 (two) times daily.     Yes Historical Provider, MD  Cholecalciferol (VITAMIN D3) 2000 UNITS TABS Take 1 tablet by mouth daily.     Yes Historical Provider, MD  diazepam (VALIUM) 5 MG tablet Take 5 mg by mouth every 6 (six) hours as needed. For anxiety   Yes Historical Provider, MD  docusate sodium (COLACE) 100 MG capsule Take 100 mg by mouth daily.     Yes Historical Provider, MD  ferrous sulfate 325 (65 FE)  MG tablet Take 325 mg by mouth daily with breakfast.     Yes Historical Provider, MD  gabapentin (NEURONTIN) 100 MG capsule Take 200 mg by mouth at bedtime.    Yes Historical Provider, MD  HYDROcodone-acetaminophen (NORCO) 5-325 MG per tablet Take 1 tablet by mouth every 5 (five) hours as needed (pain).    Yes Historical Provider, MD  metoprolol succinate (TOPROL-XL) 25 MG 24 hr tablet Take 0.5 tablets (12.5 mg total) by mouth daily. 03/21/14  Yes Wendall StadePeter C Nishan, MD  Multiple Vitamin (MULTIVITAMIN) tablet Take 1 tablet by mouth daily.     Yes Historical Provider, MD  nitroGLYCERIN (NITROSTAT) 0.4 MG SL tablet Place 1 tablet (0.4 mg total) under the tongue every 5 (five) minutes x 3 doses as needed for chest pain. 11/15/11  Yes Ok Anishristopher R Berge, NP  pantoprazole (PROTONIX) 40 MG tablet TAKE ONE TABLET BY MOUTH ONCE DAILY 02/16/14  Yes Pecola LawlessWilliam F Hopper, MD  polyethylene glycol [facility](MIRALAX / GLYCOLAX) packet Take 17 g by mouth 2 (two) times daily.   Yes Historical Provider, MD  Probiotic Product (ALIGN) 4 MG CAPS Take 1 capsule by mouth daily.     Yes Historical Provider, MD  pyridOXINE (VITAMIN B-6) 100 MG tablet Take 100 mg by mouth daily.     Yes Historical Provider, MD  spironolactone (ALDACTONE) 25 MG tablet Take 1 tablet (25 mg total) by mouth daily. 03/21/14  Yes Wendall StadePeter C Nishan, MD  warfarin (COUMADIN) 2.5 MG tablet Take as directed by Coumadin clinic Patient taking differently: Take 2.5-5 mg by mouth daily. Take 5mg  on M,F all other days take 2.5mg  01/03/14  Yes Wendall StadePeter C Nishan, MD  amoxicillin (AMOXIL) 500 MG tablet Take 1 tablet (500 mg total) by mouth 3 (three) times daily. Patient not taking: Reported on 05/01/2014 02/07/12   Pecola LawlessWilliam F Hopper, MD  polyethylene glycol powder (GLYCOLAX/MIRALAX) powder TAKE 17 GRAMS BY MOUTH DAILY AS NEEDED FOR CONSTIPATION. Patient not taking: Reported on 05/01/2014 08/25/12   Pecola LawlessWilliam F Hopper, MD    Physical Exam: Filed Vitals:   05/01/14 0745 05/01/14 0800  05/01/14 0815 05/01/14 0830  BP: 168/63 140/88 134/60 149/59  Pulse: 84 69 65 61  Temp:      TempSrc:      Resp: 14 14 16 13   Height:      Weight:      SpO2: 97% 97% 98% 98%     General:  No apparent distress, pleasant  Eyes: PERRL, EOMI, no scleral icterus  ENT: moist oropharynx  Neck: supple, no lymphadenopathy  Cardiovascular: irregular, without MRG; 2+ peripheral  pulses, no JVD, no peripheral edema  Respiratory: CTA biL, good air movement without wheezing  Abdomen: soft, non tender to palpation, positive bowel sounds  Skin: no rashes  Musculoskeletal: normal bulk and tone, no joint swelling  Psychiatric: normal mood and affect  Neurologic: non focal    Labs on Admission:  Basic Metabolic Panel:  Recent Labs Lab 05/01/14 0729  NA 134*  K 3.8  CL 96  CO2 30  GLUCOSE 117*  BUN 13  CREATININE 1.18*  CALCIUM 8.9   Liver Function Tests:  Recent Labs Lab 05/01/14 0729  AST 29  ALT 18  ALKPHOS 76  BILITOT 0.7  PROT 7.1  ALBUMIN 3.6   CBC:  Recent Labs Lab 05/01/14 0729  WBC 8.4  HGB 10.7*  HCT 32.8*  MCV 97.9  PLT 281   Radiological Exams on Admission: No results found.  EKG: Independently reviewed.  Assessment/Plan Principal Problem:   GI bleed Active Problems:   Essential hypertension   Atrial fibrillation   DM (diabetes mellitus)   Chronic anticoagulation    Painless hematochezia - Hb slightly decreased when compared to previous values. Will admit patient for observation, monitor CBC every 12 hours. This is likely diverticular vs hemorrhoidal bleed. It seems like its getting worse at home, so I'll consult GI for evaluation.  - gentle fluids, hold Spironolactone  A fib - seems rate controlled, continue Metoprolol, admit to telemetry  - hold Coumadin for now  DM - seems diet controlled, last A1C 6.5 in 2015.   HTN - resume Metoprolol  Anxiety - resume home medication   Constipation - miralax, colace   Diet:  NPO Fluids: NS DVT Prophylaxis: SCD  Code Status: DNR  Family Communication: d/w daughter bedside  Disposition Plan: admit to tele   Costin M. Elvera Lennox, MD [facility] Pager [phone]  If 7PM-7AM, please contact night-coverage [email or web address] Password [email or web address] 05/01/2014, 9:15 AM

## 2014-05-01 NOTE — Progress Notes (Signed)
05/01/14 Irregular tele  At present time, call to physician to notify, came and observed strips, states that's all right at this time.

## 2014-05-01 NOTE — ED Notes (Signed)
Per [facility], pt from home for bright red rectal bleeding this morning. Last BM was last Monday which was large and pt states it cleaned her out. She has hx of hemorrhoids and takes coumadin also. Sts she got up to go to the bathroom this morning and it was all bright red blood in the commode. Pt is alert and oriented. Has chronic pain but denies any new pain.

## 2014-05-01 NOTE — Consult Note (Signed)
Cardiology Consultation   Patient ID: MERIDA ALCANTAR; [id number]; [birth year]   Admit date: 05/01/2014 Date of Consult: 05/01/2014  Primary Physician: Marga Melnick, MD Cardiologist: Dr. Charlton Haws    Reason for Consultation: Bradycardia   History of Present Illness: Lindsey [name] is a 81 y.o. female with a history of chronic atrial fibrillation on Coumadin anticoagulation, HTN, venous insufficiency, diverticulosis, GERD. She presented to the emergency room this morning with bright red blood per rectum. She described intermittent bleeding in her stools over the last 5 days. The amount of bleeding this morning was significantly greater. Initial hemoglobin 10.7. This is not significantly different from prior readings.  Gastroenterology has seen her. Bleeding was felt to be minor. Source could be hemorrhoids versus diverticular. Plan for now is observation. The patient prefers to avoid colonoscopy. Patient noted to be bradycardic on telemetry with heart rates in the 30s at times. Cardiology is asked to further evaluate.   Chronic constipation from opiod use for arthritis.  Been on Norco for 7 years take multiple pills daily.  No relief of constipation with miralax or stool softeners Urge for BM and strains with no result but BRBPR.    Afib chronic on low dose beta blocker Review of telemetry shows average HR 60's  With no real long pauses.    Has GERD/indigestion still NPO  No chest pain, dizzyness palpitations   INR only 2.1 on admission and not supra Rx  Echocardiogram 11/15/11 - Left ventricle: The cavity size was normal. Wall thicknesswas normal. Systolic function was normal. The estimatedejection fraction was in the range of 60% to 65%. - Aortic valve: Mild regurgitation. - Left atrium: The atrium was mildly dilated.   Past Medical History  Diagnosis Date  . Atrial fibrillation     a. with bradycardia - remains on bb;  b. chronic coumadin.  . Diverticulitis  2003-2008  . GERD (gastroesophageal reflux disease)   . Osteoporosis     T score -3.9 @ forearm  . Vitamin D deficiency     Vitamin D 27 in 2010  . Anemia     PMH  of  . Duodenal ulcer 1976    surgery   . 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 Despina Hick  . Hx of colonoscopy 02/2006    Internal hemorrhoids  . Nipple discharge   . Arthritis   . Hemorrhoids   . Venous stasis     a. bilat LE w/ variscosities.  [name] Hearing loss   . Syncope     a. 2003 - felt to be vasovagal;  b. 06/2001 Echo: EF 55-60%, no rwma;    Past Surgical History  Procedure Laterality Date  . Appendectomy    . Cholecystectomy    . Tonsillectomy and adenoidectomy    . Total hip arthroplasty  2010    transfusions  . Breast biopsy  11/05/10    breast lump removed- rt br  . Laparoscopic gastrotomy w/ repair of ulcer    . Abdominal hysterectomy        Home Meds: Prior to Admission medications   Medication Sig Start Date End Date Taking? Authorizing Provider  Calcium Carbonate-Vitamin D (CALTRATE 600+D) 600-400 MG-UNIT per tablet Take 1 tablet by mouth 2 (two) times daily.     Yes Historical Provider, MD  Cholecalciferol (VITAMIN D3) 2000 UNITS TABS Take 1 tablet by mouth daily.     Yes Historical Provider, MD  diazepam (VALIUM) 5 MG tablet Take 5 mg by mouth every  6 (six) hours as needed. For anxiety   Yes Historical Provider, MD  docusate sodium (COLACE) 100 MG capsule Take 100 mg by mouth daily.     Yes Historical Provider, MD  ferrous sulfate 325 (65 FE) MG tablet Take 325 mg by mouth daily with breakfast.     Yes Historical Provider, MD  gabapentin (NEURONTIN) 100 MG capsule Take 200 mg by mouth at bedtime.    Yes Historical Provider, MD  HYDROcodone-acetaminophen (NORCO) 5-325 MG per tablet Take 1 tablet by mouth every 5 (five) hours as needed (pain).    Yes Historical Provider, MD  metoprolol succinate (TOPROL-XL) 25 MG 24 hr tablet Take 0.5 tablets (12.5 mg total) by mouth daily. 03/21/14  Yes Wendall StadePeter  C Vandy Fong, MD  Multiple Vitamin (MULTIVITAMIN) tablet Take 1 tablet by mouth daily.     Yes Historical Provider, MD  nitroGLYCERIN (NITROSTAT) 0.4 MG SL tablet Place 1 tablet (0.4 mg total) under the tongue every 5 (five) minutes x 3 doses as needed for chest pain. 11/15/11  Yes Ok Anishristopher R Berge, NP  pantoprazole (PROTONIX) 40 MG tablet TAKE ONE TABLET BY MOUTH ONCE DAILY 02/16/14  Yes Pecola LawlessWilliam F Hopper, MD  polyethylene glycol [facility](MIRALAX / GLYCOLAX) packet Take 17 g by mouth 2 (two) times daily.   Yes Historical Provider, MD  Probiotic Product (ALIGN) 4 MG CAPS Take 1 capsule by mouth daily.     Yes Historical Provider, MD  pyridOXINE (VITAMIN B-6) 100 MG tablet Take 100 mg by mouth daily.     Yes Historical Provider, MD  spironolactone (ALDACTONE) 25 MG tablet Take 1 tablet (25 mg total) by mouth daily. 03/21/14  Yes Wendall StadePeter C Brynden Thune, MD  warfarin (COUMADIN) 2.5 MG tablet Take as directed by Coumadin clinic Patient taking differently: Take 2.5-5 mg by mouth daily. Take 5mg  on M,F all other days take 2.5mg  01/03/14  Yes Wendall StadePeter C Camary Sosa, MD  amoxicillin (AMOXIL) 500 MG tablet Take 1 tablet (500 mg total) by mouth 3 (three) times daily. Patient not taking: Reported on 05/01/2014 02/07/12   Pecola LawlessWilliam F Hopper, MD  polyethylene glycol powder (GLYCOLAX/MIRALAX) powder TAKE 17 GRAMS BY MOUTH DAILY AS NEEDED FOR CONSTIPATION. Patient not taking: Reported on 05/01/2014 08/25/12   Pecola LawlessWilliam F Hopper, MD     Current Medications: . docusate sodium  100 mg Oral Daily  . gabapentin  200 mg Oral QHS  . insulin aspart  0-9 Units Subcutaneous TID WC  . polyethylene glycol  17 g Oral BID  . sodium chloride  3 mL Intravenous Q12H      Allergies:    Allergies  Allergen Reactions  . Haloperidol Lactate   . Hydromorphone Hcl     REACTION: SICK ON STOMACH  . Ibuprofen   . Melatonin   . Morphine   . Nortriptyline Hcl   . Pentazocine Lactate      Social History:   The patient  reports that she has never smoked.  She has never used smokeless tobacco. She reports that she does not drink alcohol or use illicit drugs.     Family History:   The patient's family history includes Breast cancer in her sister; Cancer in her sister; Coronary artery disease in her brother; Diverticulosis in her brother; Emphysema (age of onset: 7081) in her father; Heart disease in her brother; Other (age of onset: 4687) in her mother.    ROS:  Please see the history of present illness.    All other systems reviewed and negative.  Vital Signs: Blood pressure 126/60, pulse 72, temperature 98.4 F (36.9 C), temperature source Oral, resp. rate 16, height  (1.651 m), weight 187 lb 13.3 oz (85.2 kg), SpO2 96 %.   Affect appropriate Elderly kyphotic female  HEENT: normal Neck supple with no adenopathy JVP normal no bruits no thyromegaly Lungs clear with no wheezing and good diaphragmatic motion Heart:  S1/S2 no murmur, no rub, gallop or click PMI normal Abdomen: benighn, BS positve, no tenderness, no AAA no bruit.  No HSM or HJR Distal pulses intact with no bruits No edema Neuro non-focal Skin warm and dry No muscular weakness    EKG:  Atrial fibrillation, HR 53 Telemetry:  afib rates 50-65  No long pauses 05/01/2014   Labs:  No results for input(s): CKTOTAL, CKMB, TROPONINI in the last 72 hours.  Lab Results  Component Value Date   WBC 8.4 05/01/2014   HGB 10.7* 05/01/2014   HCT 32.8* 05/01/2014   MCV 97.9 05/01/2014   PLT 281 05/01/2014     Recent Labs Lab 05/01/14 0729  NA 134*  K 3.8  CL 96  CO2 30  BUN 13  CREATININE 1.18*  CALCIUM 8.9  PROT 7.1  BILITOT 0.7  ALKPHOS 76  ALT 18  AST 29  GLUCOSE 117*    Lab Results  Component Value Date   CHOL 193 11/15/2011   HDL 93 11/15/2011   LDLCALC 88 11/15/2011   TRIG 61 11/15/2011    Lab Results  Component Value Date   DDIMER <0.27 11/14/2011     Radiology/Studies:  No results found.   ASSESSMENT:  Principal Problem:   GI  bleed Active Problems:   Chronic atrial fibrillation   Essential hypertension   DM (diabetes mellitus)   Chronic anticoagulation     PLAN:  Bradycardia:  Chronic afib no long pauses ok to hold beta blocker no indication for pacer Afib:  Chronic  Ok to hold coumadin if needed for GI bleed Constipation: and likely hemorrhoidal bleeding  Chronic narcotic use for arthritis.  Consider movantik in addition to fiber, and stool softeners GI Bleed:  Per Dr Elnoria Howard  No colonoscopy planned Hct stable ok to hold coumadin but INR fairly low already and issue is constipation GERD:  Continue protonix add maalox     Signed, Charlton Haws

## 2014-05-01 NOTE — ED Notes (Signed)
hospitalist at the bedside 

## 2014-05-02 ENCOUNTER — Telehealth: Payer: Self-pay | Admitting: Pharmacist

## 2014-05-02 ENCOUNTER — Encounter (HOSPITAL_COMMUNITY): Payer: Self-pay | Admitting: *Deleted

## 2014-05-02 DIAGNOSIS — I482 Chronic atrial fibrillation: Secondary | ICD-10-CM

## 2014-05-02 DIAGNOSIS — F4323 Adjustment disorder with mixed anxiety and depressed mood: Secondary | ICD-10-CM

## 2014-05-02 LAB — COMPREHENSIVE METABOLIC PANEL
ALT: 17 U/L (ref 0–35)
AST: 26 U/L (ref 0–37)
Albumin: 3.5 g/dL (ref 3.5–5.2)
Alkaline Phosphatase: 73 U/L (ref 39–117)
Anion gap: 3 — ABNORMAL LOW (ref 5–15)
BILIRUBIN TOTAL: 1.4 mg/dL — AB (ref 0.3–1.2)
BUN: 10 mg/dL (ref 6–23)
CO2: 31 mmol/L (ref 19–32)
CREATININE: 1.1 mg/dL (ref 0.50–1.10)
Calcium: 9 mg/dL (ref 8.4–10.5)
Chloride: 104 mmol/L (ref 96–112)
GFR calc Af Amer: 50 mL/min — ABNORMAL LOW (ref >90)
GFR calc non Af Amer: 44 mL/min — ABNORMAL LOW (ref >90)
Glucose, Bld: 97 mg/dL (ref 70–99)
Potassium: 4 mmol/L (ref 3.5–5.1)
Sodium: 138 mmol/L (ref 135–145)
Total Protein: 6.7 g/dL (ref 6.0–8.3)

## 2014-05-02 LAB — CBC
HCT: 32.6 % — ABNORMAL LOW (ref 36.0–46.0)
HCT: 34 % — ABNORMAL LOW (ref 36.0–46.0)
HEMOGLOBIN: 10.3 g/dL — AB (ref 12.0–15.0)
Hemoglobin: 11.1 g/dL — ABNORMAL LOW (ref 12.0–15.0)
MCH: 31.1 pg (ref 26.0–34.0)
MCH: 32 pg (ref 26.0–34.0)
MCHC: 31.6 g/dL (ref 30.0–36.0)
MCHC: 32.6 g/dL (ref 30.0–36.0)
MCV: 98.5 fL (ref 78.0–100.0)
MCV: 98 fL (ref 78.0–100.0)
Platelets: 284 10*3/uL (ref 150–400)
Platelets: 294 10*3/uL (ref 150–400)
RBC: 3.31 MIL/uL — ABNORMAL LOW (ref 3.87–5.11)
RBC: 3.47 MIL/uL — ABNORMAL LOW (ref 3.87–5.11)
RDW: 13.1 % (ref 11.5–15.5)
RDW: 13.3 % (ref 11.5–15.5)
WBC: 7.9 10*3/uL (ref 4.0–10.5)
WBC: 9.8 10*3/uL (ref 4.0–10.5)

## 2014-05-02 LAB — GLUCOSE, CAPILLARY
GLUCOSE-CAPILLARY: 100 mg/dL — AB (ref 70–99)
Glucose-Capillary: 109 mg/dL — ABNORMAL HIGH (ref 70–99)
Glucose-Capillary: 106 mg/dL — ABNORMAL HIGH (ref 70–99)
Glucose-Capillary: 108 mg/dL — ABNORMAL HIGH (ref 70–99)

## 2014-05-02 LAB — TSH: TSH: 1.436 u[IU]/mL (ref 0.350–4.500)

## 2014-05-02 MED ORDER — BISACODYL 10 MG RE SUPP
10.0000 mg | Freq: Once | RECTAL | Status: AC
  Administered 2014-05-02: 10 mg via RECTAL
  Filled 2014-05-02: qty 1

## 2014-05-02 NOTE — Progress Notes (Signed)
[facility] PROGRESS NOTE  Lindsey [name] XBJ:[id number] DOB: [birth year] DOA: 05/01/2014 PCP: Marga Melnick, MD  Assessment/Plan: 81 y/o female with PMH of A fib (on coumadin), chronic pains, h/o diverticulitis, hemorrhoids presented with rectal bleeding  -admitted with rectal bleeding, a fib with bradycardia   1. Rectal bleeding, suspected hemorrhoidal bleeding; no more obvious bleeding, but no BM yet; no abd pains -Hg mild drop, but stable; will cont to hold coumadin, added bowel regimen, monitor; appreciate GI eval   2. Chronic A fib, on coumadin; bradycardia-resolved; holding coumadin due to GIB 3. Anxiety - resume home medication  4. Constipation - miralax, colace; suppository prn    Code Status: DNR Family Communication: d/w patient, her daughter  (indicate person spoken with, relationship, and if by phone, the number) Disposition Plan: home likely 24-48 hrs    Consultants:  GI  Cardiology   Procedures:  none  Antibiotics:  None  (indicate start date, and stop date if known)  HPI/Subjective: Alert, orineted   Objective: Filed Vitals:   05/02/14 0516  BP: 153/96  Pulse: 64  Temp: 97.8 F (36.6 C)  Resp: 19    Intake/Output Summary (Last 24 hours) at 05/02/14 1109 Last data filed at 05/02/14 0832  Gross per 24 hour  Intake 1295.83 ml  Output   2550 ml  Net -1254.17 ml   Filed Weights   05/01/14 0716 05/01/14 1009  Weight: 83.008 kg (183 lb) 85.2 kg (187 lb 13.3 oz)    Exam:   General:  alert  Cardiovascular: s1,s2 rrr  Respiratory: CTA BL  Abdomen: soft, nt,nd   Musculoskeletal: no leg edema   Data Reviewed: Basic Metabolic Panel:  Recent Labs Lab 05/01/14 0729 05/02/14 0606  NA 134* 138  K 3.8 4.0  CL 96 104  CO2 30 31  GLUCOSE 117* 97  BUN 13 10  CREATININE 1.18* 1.10  CALCIUM 8.9 9.0   Liver Function Tests:  Recent Labs Lab 05/01/14 0729 05/02/14 0606  AST 29 26  ALT 18 17  ALKPHOS 76 73  BILITOT 0.7  1.4*  PROT 7.1 6.7  ALBUMIN 3.6 3.5   No results for input(s): LIPASE, AMYLASE in the last 168 hours. No results for input(s): AMMONIA in the last 168 hours. CBC:  Recent Labs Lab 05/01/14 0729 05/01/14 1908 05/02/14 0606  WBC 8.4 7.0 7.9  HGB 10.7* 10.4* 10.3*  HCT 32.8* 31.9* 32.6*  MCV 97.9 97.9 98.5  PLT 281 275 284   Cardiac Enzymes:  Recent Labs Lab 05/01/14 1605  TROPONINI <0.03   BNP (last 3 results) No results for input(s): BNP in the last 8760 hours.  ProBNP (last 3 results) No results for input(s): PROBNP in the last 8760 hours.  CBG:  Recent Labs Lab 05/01/14 1016 05/01/14 1215 05/01/14 1659 05/01/14 2143 05/02/14 0741  GLUCAP 110* 121* 116* 126* 100*    No results found for this or any previous visit (from the past 240 hour(s)).   Studies: No results found.  Scheduled Meds: . docusate sodium  100 mg Oral Daily  . gabapentin  200 mg Oral QHS  . insulin aspart  0-9 Units Subcutaneous TID WC  . polyethylene glycol  17 g Oral BID  . sodium chloride  3 mL Intravenous Q12H   Continuous Infusions: . sodium chloride 50 mL/hr at 05/02/14 3086    Principal Problem:   GI bleed Active Problems:   Essential hypertension   Chronic atrial fibrillation   DM (diabetes mellitus)  Chronic anticoagulation    Time spent: >35 minutes     Esperanza SheetsBURIEV, Anushree Dorsi N  [facility] Pager [phone]. If 7PM-7AM, please contact night-coverage at [email or web address], password [email or web address] 05/02/2014, 11:09 AM  LOS: 1 day

## 2014-05-02 NOTE — Telephone Encounter (Signed)
Spoke with pt's daughter.  Appt canceled and will have her reschedule once she is discharged from the hospital.

## 2014-05-02 NOTE — Progress Notes (Signed)
Utilization Review completed. Aby Gessel RN BSN CM 

## 2014-05-02 NOTE — Progress Notes (Signed)
Patient states that she has not had bowel movement since last Monday .

## 2014-05-02 NOTE — Progress Notes (Signed)
Patient: Lindsey [name] / Admit Date: 05/01/2014 / Date of Encounter: 05/02/2014, 10:10 AM   Subjective: Feeling well. No complaints. No dizziness, presyncope, syncope, awareness of irregular HR, chest pain, dyspnea, abdominal pain. Has not yet had another BM.   Objective: Telemetry: chronic afib, rates controlled, occasional dipping of HR into the 40s (briefly), no pauses >2.3 sec Physical Exam: Blood pressure 153/96, pulse 64, temperature 97.8 F (36.6 C), temperature source Axillary, resp. rate 19, height  (1.651 m), weight 187 lb 13.3 oz (85.2 kg), SpO2 95 %. General: Well developed, well nourished WF, in no acute distress. Head: Normocephalic, atraumatic, sclera non-icteric, no xanthomas, nares are without discharge. Neck: JVP not elevated. Lungs: Clear bilaterally to auscultation without wheezes, rales, or rhonchi. Breathing is unlabored. Heart: Irregularly irregular S1 S2 without murmurs, rubs, or gallops.  Abdomen: Soft, non-tender, non-distended with normoactive bowel sounds. No rebound/guarding. Extremities: No clubbing or cyanosis. No edema.  Neuro: Alert and oriented X 3. Moves all extremities spontaneously. Psych:  Responds to questions appropriately with a normal affect.   Intake/Output Summary (Last 24 hours) at 05/02/14 1010 Last data filed at 05/02/14 0832  Gross per 24 hour  Intake 1295.83 ml  Output   2850 ml  Net -1554.17 ml    Inpatient Medications:  . docusate sodium  100 mg Oral Daily  . gabapentin  200 mg Oral QHS  . insulin aspart  0-9 Units Subcutaneous TID WC  . polyethylene glycol  17 g Oral BID  . sodium chloride  3 mL Intravenous Q12H   Infusions:  . sodium chloride 50 mL/hr at 05/02/14 9147    Labs:  Recent Labs  05/01/14 0729 05/02/14 0606  NA 134* 138  K 3.8 4.0  CL 96 104  CO2 30 31  GLUCOSE 117* 97  BUN 13 10  CREATININE 1.18* 1.10  CALCIUM 8.9 9.0    Recent Labs  05/01/14 0729 05/02/14 0606  AST 29 26  ALT 18  17  ALKPHOS 76 73  BILITOT 0.7 1.4*  PROT 7.1 6.7  ALBUMIN 3.6 3.5    Recent Labs  05/01/14 1908 05/02/14 0606  WBC 7.0 7.9  HGB 10.4* 10.3*  HCT 31.9* 32.6*  MCV 97.9 98.5  PLT 275 284    Recent Labs  05/01/14 1605  TROPONINI <0.03   Invalid input(s): POCBNP No results for input(s): HGBA1C in the last 72 hours.   Radiology/Studies:  No results found.   Assessment and Plan   1. Chronic atrial fibrillation (CHADSVASC = 4) with history of bradycardia - she has had brief asymptomatic bradycardia noted on telemetry this admission. BB discontinued. No current indication for pacemaker given that she is asymptomatic and episodes are brief. Warning signs reviewed with the patient. Will add TSH to labwork for completeness.  2. Mild hematochezia in the setting of chronic narcotic use for arthritis and constipation - GI feels this is mild and does not plan further procedures at this time, question hemorrhoidal versus diverticular. She has not had a BM since initial event. Rx constipation per primary team. Dr. Eden Emms recommends consideration of Movantik. OK to hold Coumadin if needed for GIB. Await GI input regarding when they are OK with resuming. No commentary given on initial consult. Will need rescheduling of INR check upon discharge. We (versus GI) will also need to follow Hgb closely given mild downtrend. Please let our service know when this will be so we can help arrange.  3. H/o LEE and hypertension - consider  resuming low dose spironolactone.  Signed, Ronie Spiesayna Dunn PA-C Agree with above assessment and plan. Heart rate satisfactory off BB. Will plan to resume warfarin when okay with GI. No chest pain or dyspnea. Lungs clear.

## 2014-05-02 NOTE — Telephone Encounter (Signed)
New Message  Pt daughter called to canc appt for today 4/11 @ and to see when she can resch since she is in the hosp and may get her levels checked there. Please call back and discuss.

## 2014-05-03 ENCOUNTER — Encounter: Payer: Self-pay | Admitting: Cardiovascular Disease

## 2014-05-03 DIAGNOSIS — K5900 Constipation, unspecified: Secondary | ICD-10-CM

## 2014-05-03 LAB — CBC
HEMATOCRIT: 33.3 % — AB (ref 36.0–46.0)
Hemoglobin: 10.8 g/dL — ABNORMAL LOW (ref 12.0–15.0)
MCH: 31.5 pg (ref 26.0–34.0)
MCHC: 32.4 g/dL (ref 30.0–36.0)
MCV: 97.1 fL (ref 78.0–100.0)
Platelets: 303 10*3/uL (ref 150–400)
RBC: 3.43 MIL/uL — ABNORMAL LOW (ref 3.87–5.11)
RDW: 13.2 % (ref 11.5–15.5)
WBC: 8.5 10*3/uL (ref 4.0–10.5)

## 2014-05-03 LAB — BASIC METABOLIC PANEL
Anion gap: 10 (ref 5–15)
BUN: 16 mg/dL (ref 6–23)
CO2: 25 mmol/L (ref 19–32)
CREATININE: 1.11 mg/dL — AB (ref 0.50–1.10)
Calcium: 9 mg/dL (ref 8.4–10.5)
Chloride: 104 mmol/L (ref 96–112)
GFR calc Af Amer: 50 mL/min — ABNORMAL LOW (ref >90)
GFR, EST NON AFRICAN AMERICAN: 43 mL/min — AB (ref >90)
GLUCOSE: 99 mg/dL (ref 70–99)
Potassium: 4 mmol/L (ref 3.5–5.1)
SODIUM: 139 mmol/L (ref 135–145)

## 2014-05-03 LAB — GLUCOSE, CAPILLARY
GLUCOSE-CAPILLARY: 107 mg/dL — AB (ref 70–99)
Glucose-Capillary: 112 mg/dL — ABNORMAL HIGH (ref 70–99)

## 2014-05-03 NOTE — Care Management Note (Signed)
    Page 1 of 1   05/03/2014     3:01:13 PM CARE MANAGEMENT NOTE 05/03/2014  Patient:  Lindsey [name],Lindsey [name]   Account Number:  [id number]  Date Initiated:  05/03/2014  Documentation initiated by:  Lawerance SabalSWIST,DEBBIE  Subjective/Objective Assessment:   pt with GI bleed, generalized weakness, from home lives alone     Action/Plan:   will follow for discharge needs   Anticipated DC Date:  05/03/2014   Anticipated DC Plan:  HOME/SELF CARE      DC Planning Services  CM consult      [facility]   Choice offered to / List presented to:  C-1 Patient        HH arranged  HH-2 PT      HH agency  [facility]   Status of service:  Completed, signed off Medicare Important Message given?   (If response is "NO", the following Medicare IM given date fields will be blank) Date Medicare IM given:   Medicare IM given by:   Date Additional Medicare IM given:   Additional Medicare IM given by:    Discharge Disposition:  HOME W HOME HEALTH SERVICES  Per UR Regulation:    If discussed at Long Length of Stay Meetings, dates discussed:    Comments:  05-03-14 Lawerance Sabalebbie Swist RN BSN CM [facility], Miranda notified.

## 2014-05-03 NOTE — Discharge Summary (Signed)
Physician Discharge Summary  Lindsey [name] WUJ:[id number] DOB: [birth year] DOA: 05/01/2014  PCP: Marga Melnick, MD  Admit date: 05/01/2014 Discharge date: 05/03/2014  Time spent: >35 minutes  Recommendations for Outpatient Follow-up:  F/u with coumadin clinic   F/u with PCP in 3-5 days Discharge Diagnoses:  Principal Problem:   GI bleed Active Problems:   Essential hypertension   Chronic atrial fibrillation   DM (diabetes mellitus)   Chronic anticoagulation   Discharge Condition: stable   Diet recommendation: low sodium   Filed Weights   05/01/14 0716 05/01/14 1009  Weight: 83.008 kg (183 lb) 85.2 kg (187 lb 13.3 oz)    History of present illness:  81 y/o female with PMH of A fib (on coumadin), chronic pains, h/o diverticulitis, hemorrhoids presented with black stool, probably rectal/hemorrhoidal bleeding   -admitted with rectal bleeding, a fib with bradycardia    Hospital Course:  1. Rectal bleeding, suspected hemorrhoidal bleeding with constipation; no more obvious bleeding, Pt had normal bowel movement. Hg remained stable; no abd pains; patient did not require any blood transfusions;   2. Chronic A fib, on coumadin; bradycardia-resolved; BB discontinued per cardiology;  -d/w patient, no s/s of acute bleeding; will resume home coumadin. Recommended to use bowel regimen to prevent constipation (which probable caused hemorrhoidal  Bleeding), monitor stools for s/s of bleeding and  and f/u with PCP to recheck/monitor Hg in 3-5 days  3. Anxiety - resume home medication  4. Constipation - miralax, colace; suppository prn;  -constipation-resolved   Procedures:  none (i.e. Studies not automatically included, echos, thoracentesis, etc; not x-rays)  Consultations:  GI  Cardiology   Discharge Exam: Filed Vitals:   05/03/14 0539  BP: 122/60  Pulse: 71  Temp: 98.2 F (36.8 C)  Resp: 16    General: alert, oriented  Cardiovascular: s1,s2 rrr Respiratory:  CTA BL  Discharge Instructions  Discharge Instructions    Diet - low sodium heart healthy    Complete by:  As directed      Discharge instructions    Complete by:  As directed   Please follow up with coumadin clinic in 2-3 days  Please follow up with primary care doctor in 3-5 days     Increase activity slowly    Complete by:  As directed             Medication List    STOP taking these medications        amoxicillin 500 MG tablet  Commonly known as:  AMOXIL     metoprolol succinate 25 MG 24 hr tablet  Commonly known as:  TOPROL-XL      TAKE these medications        ALIGN 4 MG Caps  Take 1 capsule by mouth daily.     CALTRATE 600+D 600-400 MG-UNIT per tablet  Generic drug:  Calcium Carbonate-Vitamin D  Take 1 tablet by mouth 2 (two) times daily.     diazepam 5 MG tablet  Commonly known as:  VALIUM  Take 5 mg by mouth every 6 (six) hours as needed. For anxiety     docusate sodium 100 MG capsule  Commonly known as:  COLACE  Take 100 mg by mouth daily.     ferrous sulfate 325 (65 FE) MG tablet  Take 325 mg by mouth daily with breakfast.     gabapentin 100 MG capsule  Commonly known as:  NEURONTIN  Take 200 mg by mouth at bedtime.     HYDROcodone-acetaminophen 5-325  MG per tablet  Commonly known as:  NORCO/VICODIN  Take 1 tablet by mouth every 5 (five) hours as needed (pain).     multivitamin tablet  Take 1 tablet by mouth daily.     nitroGLYCERIN 0.4 MG SL tablet  Commonly known as:  NITROSTAT  Place 1 tablet (0.4 mg total) under the tongue every 5 (five) minutes x 3 doses as needed for chest pain.     pantoprazole 40 MG tablet  Commonly known as:  PROTONIX  TAKE ONE TABLET BY MOUTH ONCE DAILY     polyethylene glycol packet  Commonly known as:  MIRALAX / GLYCOLAX  Take 17 g by mouth 2 (two) times daily.     pyridOXINE 100 MG tablet  Commonly known as:  VITAMIN B-6  Take 100 mg by mouth daily.     spironolactone 25 MG tablet  Commonly known as:   ALDACTONE  Take 1 tablet (25 mg total) by mouth daily.     Vitamin D3 2000 UNITS Tabs  Take 1 tablet by mouth daily.     warfarin 2.5 MG tablet  Commonly known as:  COUMADIN  Take as directed by Coumadin clinic       Allergies  Allergen Reactions  . Haloperidol Lactate   . Hydromorphone Hcl     REACTION: SICK ON STOMACH  . Ibuprofen   . Melatonin   . Morphine   . Nortriptyline Hcl   . Pentazocine Lactate        Follow-up Information    Follow up with Marga MelnickWilliam Hopper, MD In 3 days.   Specialty:  Internal Medicine   Contact information:   [address] [place] [id number] [phone]        The results of significant diagnostics from this hospitalization (including imaging, microbiology, ancillary and laboratory) are listed below for reference.    Significant Diagnostic Studies: No results found.  Microbiology: No results found for this or any previous visit (from the past 240 hour(s)).   Labs: Basic Metabolic Panel:  Recent Labs Lab 05/01/14 0729 05/02/14 0606 05/03/14 0825  NA 134* 138 139  K 3.8 4.0 4.0  CL 96 104 104  CO2 30 31 25   GLUCOSE 117* 97 99  BUN 13 10 16   CREATININE 1.18* 1.10 1.11*  CALCIUM 8.9 9.0 9.0   Liver Function Tests:  Recent Labs Lab 05/01/14 0729 05/02/14 0606  AST 29 26  ALT 18 17  ALKPHOS 76 73  BILITOT 0.7 1.4*  PROT 7.1 6.7  ALBUMIN 3.6 3.5   No results for input(s): LIPASE, AMYLASE in the last 168 hours. No results for input(s): AMMONIA in the last 168 hours. CBC:  Recent Labs Lab 05/01/14 0729 05/01/14 1908 05/02/14 0606 05/02/14 1855 05/03/14 0825  WBC 8.4 7.0 7.9 9.8 8.5  HGB 10.7* 10.4* 10.3* 11.1* 10.8*  HCT 32.8* 31.9* 32.6* 34.0* 33.3*  MCV 97.9 97.9 98.5 98.0 97.1  PLT 281 275 284 294 303   Cardiac Enzymes:  Recent Labs Lab 05/01/14 1605  TROPONINI <0.03   BNP: BNP (last 3 results) No results for input(s): BNP in the last 8760 hours.  ProBNP (last 3 results) No results for  input(s): PROBNP in the last 8760 hours.  CBG:  Recent Labs Lab 05/02/14 0741 05/02/14 1211 05/02/14 1747 05/02/14 2109 05/03/14 0819  GLUCAP 100* 109* 106* 108* 112*       Signed:  Navi Erber N  [facility] 05/03/2014, 10:45 AM

## 2014-05-03 NOTE — Evaluation (Signed)
Physical Therapy Evaluation Patient Details Name: Lindsey [name] MRN: [id number] DOB: May 01, 1923 Today's Date: 05/03/2014   History of Present Illness  Lindsey [name] is a 81 y.o. female has a past medical history significant for A fib on chronic anticoagulation with Coumadin, hypertension, hyperlipidemia, presents to the emergency room with a chief complaint of bright red blood per rectum over last 5 days.  Clinical Impression  Pt admitted with/for rectal bleeding.  Pt currently limited functionally due to the problems listed below.  (see problems list.)  Pt will benefit from PT to maximize function and safety to be able to get home safely with limited assist..     Follow Up Recommendations Home health PT    Equipment Recommendations  None recommended by PT    Recommendations for Other Services       Precautions / Restrictions Precautions Precautions: Fall Restrictions Weight Bearing Restrictions: No      Mobility  Bed Mobility Overal bed mobility: Needs Assistance Bed Mobility: Supine to Sit;Sit to Supine     Supine to sit: Modified independent (Device/Increase time) Sit to supine: Modified independent (Device/Increase time)   General bed mobility comments: needs extra time, but can reposition herself.  Transfers Overall transfer level: Needs assistance Equipment used: None;Rolling walker (2 wheeled) Transfers: Sit to/from Stand Sit to Stand: Supervision         General transfer comment: safe transfer technique, but do to RA, standing is mildly effortful  Ambulation/Gait Ambulation/Gait assistance: Modified independent (Device/Increase time) Ambulation Distance (Feet): 300 Feet Assistive device: Rolling walker (2 wheeled) Gait Pattern/deviations: Step-through pattern Gait velocity: slower   General Gait Details: Used RW safely and should be safe in a Public relations account executivehomelike environment  Stairs            Wheelchair Mobility    Modified Rankin (Stroke  Patients Only)       Balance Overall balance assessment: Needs assistance   Sitting balance-Leahy Scale: Fair     Standing balance support: Single extremity supported;Bilateral upper extremity supported Standing balance-Leahy Scale: Poor                               Pertinent Vitals/Pain Pain Assessment: Faces Faces Pain Scale: Hurts even more Pain Location: knees Pain Descriptors / Indicators: Aching;Sore Pain Intervention(s): Monitored during session    Home Living Family/patient expects to be discharged to:: Private residence Living Arrangements: Alone Available Help at Discharge: Available PRN/intermittently (family next door) Type of Home: House Home Access: Stairs to enter   Secretary/administratorntrance Stairs-Number of Steps: 2 Home Layout: One level Home Equipment: Cane - quad;Walker - 2 wheels (handles fixed to pt's toilet.)      Prior Function Level of Independence: Independent with assistive device(s)               Hand Dominance        Extremity/Trunk Assessment   Upper Extremity Assessment: Generalized weakness (grip weak)           Lower Extremity Assessment: Overall WFL for tasks assessed;Generalized weakness (due to pain)         Communication      Cognition Arousal/Alertness: Awake/alert Behavior During Therapy: WFL for tasks assessed/performed Overall Cognitive Status: Within Functional Limits for tasks assessed                      General Comments      Exercises  Assessment/Plan    PT Assessment Patient needs continued PT services  PT Diagnosis Generalized weakness   PT Problem List Decreased strength;Decreased activity tolerance;Decreased balance;Decreased mobility  PT Treatment Interventions DME instruction;Gait training;Stair training;Functional mobility training;Therapeutic activities;Balance training;Patient/family education   PT Goals (Current goals can be found in the Care Plan section) Acute Rehab PT  Goals Patient Stated Goal: get back home and independent PT Goal Formulation: With patient Time For Goal Achievement: 05/10/14 Potential to Achieve Goals: Good    Frequency Min 3X/week   Barriers to discharge Decreased caregiver support      Co-evaluation               End of Session   Activity Tolerance: Patient tolerated treatment well Patient left: in bed;with call bell/phone within reach Nurse Communication: Mobility status         Time: 1227-1248 PT Time Calculation (min) (ACUTE ONLY): 21 min   Charges:   PT Evaluation $Initial PT Evaluation Tier I: 1 Procedure     PT G Codes:        Lanisa Ishler, Eliseo Gum 05/03/2014, 12:59 PM  05/03/2014  [place] Bing, PT [phone]  (pager)

## 2014-05-03 NOTE — Progress Notes (Signed)
Subjective: No complaints.  No further hematochezia.  Objective: Vital signs in last 24 hours: Temp:  [97.7 F (36.5 C)-99.7 F (37.6 C)] 98.2 F (36.8 C) (04/12 0539) Pulse Rate:  [71] 71 (04/12 0539) Resp:  [16-20] 16 (04/12 0539) BP: (122-130)/(60-63) 122/60 mmHg (04/12 0539) SpO2:  [93 %-96 %] 96 % (04/12 0539) Last BM Date: 05/02/14  Intake/Output from previous day: 04/11 0701 - 04/12 0700 In: 1010 [P.O.:660; I.V.:350] Out: 1600 [Urine:1600] Intake/Output this shift:    General appearance: alert and no distress GI: soft, non-tender; bowel sounds normal; no masses,  no organomegaly  Lab Results:  Recent Labs  05/01/14 1908 05/02/14 0606 05/02/14 1855  WBC 7.0 7.9 9.8  HGB 10.4* 10.3* 11.1*  HCT 31.9* 32.6* 34.0*  PLT 275 284 294   BMET  Recent Labs  05/01/14 0729 05/02/14 0606  NA 134* 138  K 3.8 4.0  CL 96 104  CO2 30 31  GLUCOSE 117* 97  BUN 13 10  CREATININE 1.18* 1.10  CALCIUM 8.9 9.0   LFT  Recent Labs  05/02/14 0606  PROT 6.7  ALBUMIN 3.5  AST 26  ALT 17  ALKPHOS 73  BILITOT 1.4*   PT/INR  Recent Labs  05/01/14 0729  LABPROT 23.4*  INR 2.06*   Hepatitis Panel No results for input(s): HEPBSAG, HCVAB, HEPAIGM, HEPBIGM in the last 72 hours. C-Diff No results for input(s): CDIFFTOX in the last 72 hours. Fecal Lactopherrin No results for input(s): FECLLACTOFRN in the last 72 hours.  Studies/Results: No results found.  Medications:  Scheduled: . docusate sodium  100 mg Oral Daily  . gabapentin  200 mg Oral QHS  . insulin aspart  0-9 Units Subcutaneous TID WC  . polyethylene glycol  17 g Oral BID  . sodium chloride  3 mL Intravenous Q12H   Continuous:   Assessment/Plan: 1) Hematochezia. 2) Anemia.   No further evidence of bleeding and HGB is stable.    Plan: 1) No further GI intervention or work up. 2) Signing off.   LOS: 2 days   Lindsey [name] 05/03/2014, 7:18 AM

## 2014-05-03 NOTE — Progress Notes (Signed)
    Subjective:  Looks weak, lives alone but family next door. BM yesterday without blood.  Objective:  Vital Signs in the last 24 hours: Temp:  [97.7 F (36.5 C)-99.7 F (37.6 C)] 98.2 F (36.8 C) (04/12 0539) Pulse Rate:  [71] 71 (04/12 0539) Resp:  [16-20] 16 (04/12 0539) BP: (122-130)/(60-63) 122/60 mmHg (04/12 0539) SpO2:  [93 %-96 %] 96 % (04/12 0539)  Intake/Output from previous day:  Intake/Output Summary (Last 24 hours) at 05/03/14 1052 Last data filed at 05/03/14 1018  Gross per 24 hour  Intake    980 ml  Output   1500 ml  Net   -520 ml    Physical Exam: General appearance: alert, cooperative, no distress and pale Lungs: clear to auscultation bilaterally Heart: irregularly irregular rhythm   Rate: 76  Rhythm: atrial fibrillation  Lab Results:  Recent Labs  05/02/14 1855 05/03/14 0825  WBC 9.8 8.5  HGB 11.1* 10.8*  PLT 294 303    Recent Labs  05/02/14 0606 05/03/14 0825  NA 138 139  K 4.0 4.0  CL 104 104  CO2 31 25  GLUCOSE 97 99  BUN 10 16  CREATININE 1.10 1.11*    Recent Labs  05/01/14 1605  TROPONINI <0.03    Recent Labs  05/01/14 0729  INR 2.06*    Imaging: No results found.  Cardiac Studies:  Assessment/Plan:   Principal Problem:   GI bleed Active Problems:   Essential hypertension   Chronic atrial fibrillation   DM (diabetes mellitus)   Chronic anticoagulation   PLAN: She appears stable for discharge. [facility]. Will discuss resuming Coumadin today with MD.   Corine ShelterLuke Kilroy PA-C Bee[facility] [phone] 05/03/2014, 10:52 AM Patient is doing well. No further bleeding. Hgb stable. Okay to resume warfarin. [facility] In 2-3 days. Okay for discharge today.

## 2014-05-04 ENCOUNTER — Telehealth: Payer: Self-pay | Admitting: *Deleted

## 2014-05-04 NOTE — Telephone Encounter (Signed)
Transition Care Management Follow-up Telephone Call D/c 05/03/14  How have you been since you were released from the hospital? Pt states she is ok just still weak   Do you understand why you were in the hospital? YES   Do you understand the discharge instrcutions? YES  Items Reviewed:  Medications reviewed: YES  Allergies reviewed: YES  Dietary changes reviewed: YES, low sodium  Referrals reviewed: No referral needed   Functional Questionnaire:   Activities of Daily Living (ADLs):   She states she are independent in the following: ambulation, bathing and hygiene, feeding, continence, grooming, toileting and dressing States she require assistance with the following: ambulation sometimes   Any transportation issues/concerns?: NO   Any patient concerns? NO   Confirmed importance and date/time of follow-up visits scheduled: YES, appt made for 05/06/14 w/Dr. Alwyn RenHopper   Confirmed with patient if condition begins to worsen call PCP or go to the ER.

## 2014-05-06 ENCOUNTER — Encounter: Payer: Self-pay | Admitting: Internal Medicine

## 2014-05-06 ENCOUNTER — Ambulatory Visit (INDEPENDENT_AMBULATORY_CARE_PROVIDER_SITE_OTHER): Payer: Medicare Other | Admitting: Internal Medicine

## 2014-05-06 ENCOUNTER — Inpatient Hospital Stay: Payer: Medicare Other | Admitting: Internal Medicine

## 2014-05-06 ENCOUNTER — Other Ambulatory Visit (INDEPENDENT_AMBULATORY_CARE_PROVIDER_SITE_OTHER): Payer: Medicare Other

## 2014-05-06 VITALS — BP 140/80 | HR 83 | Temp 98.1°F | Resp 16 | Ht 65.0 in | Wt 182.0 lb

## 2014-05-06 DIAGNOSIS — I482 Chronic atrial fibrillation, unspecified: Secondary | ICD-10-CM

## 2014-05-06 DIAGNOSIS — K649 Unspecified hemorrhoids: Secondary | ICD-10-CM | POA: Insufficient documentation

## 2014-05-06 LAB — CBC WITH DIFFERENTIAL/PLATELET
BASOS PCT: 0.3 % (ref 0.0–3.0)
Basophils Absolute: 0 10*3/uL (ref 0.0–0.1)
Eosinophils Absolute: 0.2 10*3/uL (ref 0.0–0.7)
Eosinophils Relative: 2.6 % (ref 0.0–5.0)
HCT: 33.3 % — ABNORMAL LOW (ref 36.0–46.0)
Hemoglobin: 11.3 g/dL — ABNORMAL LOW (ref 12.0–15.0)
LYMPHS PCT: 17.9 % (ref 12.0–46.0)
Lymphs Abs: 1.5 10*3/uL (ref 0.7–4.0)
MCHC: 33.8 g/dL (ref 30.0–36.0)
MCV: 94.1 fl (ref 78.0–100.0)
Monocytes Absolute: 0.7 10*3/uL (ref 0.1–1.0)
Monocytes Relative: 7.7 % (ref 3.0–12.0)
Neutro Abs: 6.1 10*3/uL (ref 1.4–7.7)
Neutrophils Relative %: 71.5 % (ref 43.0–77.0)
PLATELETS: 314 10*3/uL (ref 150.0–400.0)
RBC: 3.54 Mil/uL — ABNORMAL LOW (ref 3.87–5.11)
RDW: 13.4 % (ref 11.5–15.5)
WBC: 8.5 10*3/uL (ref 4.0–10.5)

## 2014-05-06 MED ORDER — SPIRONOLACTONE 25 MG PO TABS: 25.0000 mg | ORAL_TABLET | Freq: Every day | ORAL | Status: DC

## 2014-05-06 NOTE — Progress Notes (Signed)
   Subjective:    Patient ID: Lindsey [name], female    DOB: 1923/11/27, 81 y.o.   MRN: [id number]  HPI Hospital records 4/10-4/12/16 were reviewed. She presented with painless rectal bleeding in the context of constipation and hemorrhoids. She also had chronic atrial fibrillation with bradycardia. The bradycardia did resolve. Before discharge she had a normal bowel movement and her hemoglobin was stable. It was recommended that she continue MiraLAX and Colace as well as suppository as needed. The lowest hemoglobin and hematocrit were 10.3 and 32.6. Her INR was 2.06.  At this time she denies rectal bleeding or other bleeding dyscrasias. She also denies any active cardiovascular symptoms except for chronic pedal edema for which she takes spironolactone. Creatinine was 1.18 @ admission and 1.11 at discharge.   Review of Systems   Chest pain, palpitations, tachycardia, exertional dyspnea, paroxysmal nocturnal dyspnea, or claudication   Epistaxis, hemoptysis, hematuria, or melena denied. No unexplained weight loss, significant dyspepsia,dysphagia, or abdominal pain.  There is no abnormal bruising , bleeding, or difficulty stopping bleeding with injury.     Objective:   Physical Exam Pertinent or positive findings include: Mask like almost expressionless facies There is marked accentuation of the upper thoracic spine curvature.  She has minor rales in scattered distribution without increased work of breathing. Heart sounds are somewhat distant; the heart rhythm is slightly irregular.  She has 1+ edema of the ankles.  Pedal pulses are decreased.  She ambulates with a 4 prong cane.   General appearance :adequately nourished; in no distress. Eyes: No conjunctival inflammation or scleral icterus is present. Heart:  Normal rate and regular rhythm. S1 and S2 normal without gallop, murmur, click, rub or other extra sounds   Abdomen: bowel sounds normal, soft and non-tender without masses,  organomegaly or hernias noted.  No guarding or rebound.  Vascular : all pulses equal ; no bruits present. Skin:Warm & dry.  Intact without suspicious lesions or rashes ; no tenting or jaundice  Lymphatic: No lymphadenopathy is noted about the head, neck, axilla Neuro: Strength, tone decreased       Assessment & Plan:  #1 hemorrhoidal bleeding in the context of constipation  #2 atrial fib, rate controlled  Plan: CBC monitor

## 2014-05-06 NOTE — Patient Instructions (Addendum)
BUN, creatinine, and GFR  all assess kidney function. To protect the kidneys it  is important to control your blood pressure and sugar. You should also stay well hydrated. Drink to thirst, up to 32 ounces of fluids per day.  Your next office appointment will be determined based upon review of your pending labs or xrays Those instructions will be transmitted to you by My Chart Critical results will be called. Followup as needed for any active or acute issue. Please report any significant change in your symptoms.  05/30/2014 : Charge form for Home Health Certification & Plan of Care 4/21-6/19/16 completed.

## 2014-05-06 NOTE — Progress Notes (Signed)
Pre visit review using our clinic review tool, if applicable. No additional management support is needed unless otherwise documented below in the visit note. 

## 2014-05-27 ENCOUNTER — Other Ambulatory Visit: Payer: Self-pay | Admitting: Internal Medicine

## 2014-05-27 DIAGNOSIS — G8929 Other chronic pain: Secondary | ICD-10-CM | POA: Diagnosis not present

## 2014-05-30 ENCOUNTER — Encounter: Payer: Self-pay | Admitting: Physician Assistant

## 2014-05-30 ENCOUNTER — Ambulatory Visit (INDEPENDENT_AMBULATORY_CARE_PROVIDER_SITE_OTHER): Payer: Medicare Other | Admitting: Physician Assistant

## 2014-05-30 ENCOUNTER — Ambulatory Visit (INDEPENDENT_AMBULATORY_CARE_PROVIDER_SITE_OTHER): Payer: Medicare Other

## 2014-05-30 ENCOUNTER — Encounter: Payer: Medicare Other | Admitting: Physician Assistant

## 2014-05-30 VITALS — BP 138/70 | HR 80 | Ht 65.0 in | Wt 176.0 lb

## 2014-05-30 DIAGNOSIS — Z7901 Long term (current) use of anticoagulants: Secondary | ICD-10-CM | POA: Diagnosis not present

## 2014-05-30 DIAGNOSIS — I4891 Unspecified atrial fibrillation: Secondary | ICD-10-CM | POA: Diagnosis not present

## 2014-05-30 DIAGNOSIS — I10 Essential (primary) hypertension: Secondary | ICD-10-CM

## 2014-05-30 DIAGNOSIS — K625 Hemorrhage of anus and rectum: Secondary | ICD-10-CM

## 2014-05-30 DIAGNOSIS — I482 Chronic atrial fibrillation, unspecified: Secondary | ICD-10-CM

## 2014-05-30 DIAGNOSIS — R001 Bradycardia, unspecified: Secondary | ICD-10-CM

## 2014-05-30 DIAGNOSIS — Z5181 Encounter for therapeutic drug level monitoring: Secondary | ICD-10-CM

## 2014-05-30 LAB — POCT INR: INR: 2.1

## 2014-05-30 NOTE — Patient Instructions (Signed)
Medication Instructions:  None  Labwork: None  Testing/Procedures: None  Follow-Up: Your physician wants you to follow-up in: February 2017 with Dr. Eden EmmsNishan as previously ordered.  You will receive a reminder letter in the mail two months in advance. If you don't receive a letter, please call our office to schedule the follow-up appointment.   Any Other Special Instructions Will Be Listed Below (If Applicable).

## 2014-05-30 NOTE — Progress Notes (Signed)
Cardiology Office Note   Date:  05/30/2014   ID:  Lindsey Needlelizabeth G Romanek, DOB [birth year], MRN [id number]  PCP:  Marga MelnickWilliam Hopper, MD  Cardiologist:  Dr. Charlton HawsPeter Nishan     Chief Complaint  Patient presents with  . Atrial Fibrillation  . Hospitalization Follow-up     History of Present Illness: Lindsey [name] is a 81 y.o. female with a hx of chronic atrial fibrillation, HTN, venous insufficiency, GERD. She is on Coumadin for anticoagulation. Admitted 4/10-4/12 with rectal bleeding suspected to be related to hemorrhoidal bleeding with constipation. Hemoglobins remained stable. She was seen by cardiology for bradycardia. Beta blocker was discontinued. There was no indication for pacemaker. Coumadin was held but resumed prior to discharge. She returns for follow-up.  She is here today with her daughter. She denies chest pain or significant dyspnea. She is limited by arthritis. She denies orthopnea, PND. She has chronic LE edema with evidence of venous insufficiency. There has been no change. She denies syncope. She denies further bleeding.   Studies/Reports Reviewed Today:  Echo 11/15/11 - Left ventricle: The cavity size was normal. Wall thickness   was normal. Systolic function was normal. The estimated   ejection fraction was in the range of 60% to 65%. - Aortic valve: Mild regurgitation. - Left atrium: The atrium was mildly dilated.   Past Medical History  Diagnosis Date  . Atrial fibrillation     a. with bradycardia - remains on bb;  b. chronic coumadin.  . Diverticulitis 2003-2008  . GERD (gastroesophageal reflux disease)   . Osteoporosis     T score -3.9 @ forearm  . Vitamin D deficiency     Vitamin D 27 in 2010  . Anemia     PMH  of  . Duodenal ulcer 1976    surgery   . 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 Despina HickAlusio  . Hx of colonoscopy 02/2006    Internal hemorrhoids  . Nipple discharge   . Arthritis   . Hemorrhoids   . Venous stasis     a. bilat LE w/  variscosities.  [name]. Hearing loss   . Syncope     a. 2003 - felt to be vasovagal;  b. 06/2001 Echo: EF 55-60%, no rwma;  . Restless leg syndrome     Past Surgical History  Procedure Laterality Date  . Appendectomy    . Cholecystectomy    . Tonsillectomy and adenoidectomy    . Total hip arthroplasty  2010    transfusions  . Breast biopsy  11/05/10    breast lump removed- rt br  . Laparoscopic gastrotomy w/ repair of ulcer    . Abdominal hysterectomy       Current Outpatient Prescriptions  Medication Sig Dispense Refill  . Calcium Carbonate-Vitamin D (CALTRATE 600+D) 600-400 MG-UNIT per tablet Take 1 tablet by mouth 2 (two) times daily.      . Cholecalciferol (VITAMIN D3) 2000 UNITS TABS Take 1 tablet by mouth daily.      . diazepam (VALIUM) 5 MG tablet Take 5 mg by mouth every 6 (six) hours as needed. For anxiety    . docusate sodium (COLACE) 100 MG capsule Take 100 mg by mouth daily.      . ferrous sulfate 325 (65 FE) MG tablet Take 325 mg by mouth daily with breakfast.      . gabapentin (NEURONTIN) 100 MG capsule Take 200 mg by mouth at bedtime.     [name]. HYDROcodone-acetaminophen (NORCO) 5-325 MG per  tablet Take 1 tablet by mouth every 5 (five) hours as needed (pain).     . Multiple Vitamin (MULTIVITAMIN) tablet Take 1 tablet by mouth daily.      . nitroGLYCERIN (NITROSTAT) 0.4 MG SL tablet Place 1 tablet (0.4 mg total) under the tongue every 5 (five) minutes x 3 doses as needed for chest pain. 25 tablet 3  . pantoprazole (PROTONIX) 40 MG tablet TAKE ONE TABLET BY MOUTH ONCE DAILY 90 tablet 1  . polyethylene glycol (MIRALAX / GLYCOLAX) packet Take 17 g by mouth 2 (two) times daily.    . Probiotic Product (ALIGN) 4 MG CAPS Take 1 capsule by mouth daily.      [name] pyridOXINE (VITAMIN B-6) 100 MG tablet Take 100 mg by mouth daily.      [name] spironolactone (ALDACTONE) 25 MG tablet Take 1 tablet (25 mg total) by mouth daily. 90 tablet 1  . warfarin (COUMADIN) 2.5 MG tablet Take as directed by  Coumadin clinic (Patient taking differently: Take 2.5-5 mg by mouth daily. Take  on M,F all other days take 2.5mg ) 30 tablet 3   No current facility-administered medications for this visit.    Allergies:   Haloperidol lactate; Hydromorphone hcl; Ibuprofen; Melatonin; Morphine; Nortriptyline hcl; and Pentazocine lactate    Social History:  The patient  reports that she has never smoked. She has never used smokeless tobacco. She reports that she does not drink alcohol or use illicit drugs.   Family History:  The patient's family history includes Breast cancer in her sister; Cancer in her sister; Coronary artery disease in her brother; Diverticulosis in her brother; Emphysema (age of onset: 51) in her father; Heart attack in her brother; Heart disease in her brother; Other (age of onset: 30) in her mother; Stroke in her maternal grandmother.    ROS:   Please see the history of present illness.   Review of Systems  Musculoskeletal: Positive for joint pain.  Gastrointestinal: Positive for heartburn.  All other systems reviewed and are negative.    PHYSICAL EXAM: VS:  BP 138/70 mmHg  Pulse 80  Ht  (1.651 m)  Wt 176 lb (79.833 kg)  BMI 29.29 kg/m2    Wt Readings from Last 3 Encounters:  05/30/14 176 lb (79.833 kg)  05/06/14 182 lb (82.555 kg)  05/01/14 187 lb 13.3 oz (85.2 kg)     GEN: Well nourished, well developed, in no acute distress HEENT: normal Neck: no JVD at 90, no masses Cardiac:  Normal S1/S2, irregularly irregular rhythm; no murmur ,  no rubs or gallops, bilateral LE with multiple varicosities and nonpitting edema Respiratory:  clear to auscultation bilaterally, no wheezing, rhonchi or rales. GI: soft, nontender, nondistended, + BS MS: no deformity or atrophy Skin: warm and dry  Neuro:  CNs II-XII intact, Strength and sensation are intact Psych: Normal affect   EKG:  EKG is ordered today.  It demonstrates:   Atrial fibrillation, HR 80, no change from prior  tracing   Recent Labs: 05/02/2014: ALT 17; TSH 1.436 05/03/2014: BUN 16; Creatinine 1.11*; Potassium 4.0; Sodium 139 05/06/2014: Hemoglobin 11.3*; Platelets 314.0    Lipid Panel    Component Value Date/Time   CHOL 193 11/15/2011 0800   TRIG 61 11/15/2011 0800   HDL 93 11/15/2011 0800   CHOLHDL 2.1 11/15/2011 0800   VLDL 12 11/15/2011 0800   LDLCALC 88 11/15/2011 0800      ASSESSMENT AND PLAN:  Chronic atrial fibrillation Rate is controlled. Continue Coumadin.  Bradycardia Resolved off of AV nodal blocking agents.  Essential hypertension Controlled.  Rectal bleeding  This was felt to be hemorrhoidal. There has not been recurrence. Recent hemoglobin stable while on Coumadin.  Current medicines are reviewed at length with the patient today.  Concerns regarding medicines are as outlined above.  The following changes have been made:    None   Labs/ tests ordered today include:   Orders Placed This Encounter  Procedures  . EKG 12-Lead    Disposition:   FU with Dr. Charlton HawsPeter Nishan as planned.    Signed, Brynda RimScott Alayiah Fontes, PA-C, MHS 05/30/2014 12:35 PM    [facility] [address], [place], [place]  [address] Phone: [phone]; Fax: [phone]

## 2014-06-13 ENCOUNTER — Ambulatory Visit (INDEPENDENT_AMBULATORY_CARE_PROVIDER_SITE_OTHER): Payer: Medicare Other | Admitting: *Deleted

## 2014-06-13 DIAGNOSIS — Z7901 Long term (current) use of anticoagulants: Secondary | ICD-10-CM | POA: Diagnosis not present

## 2014-06-13 DIAGNOSIS — Z5181 Encounter for therapeutic drug level monitoring: Secondary | ICD-10-CM | POA: Diagnosis not present

## 2014-06-13 DIAGNOSIS — I482 Chronic atrial fibrillation, unspecified: Secondary | ICD-10-CM

## 2014-06-13 DIAGNOSIS — I4891 Unspecified atrial fibrillation: Secondary | ICD-10-CM

## 2014-06-13 LAB — POCT INR: INR: 2.6

## 2014-07-11 ENCOUNTER — Ambulatory Visit (INDEPENDENT_AMBULATORY_CARE_PROVIDER_SITE_OTHER): Payer: Medicare Other | Admitting: *Deleted

## 2014-07-11 DIAGNOSIS — Z5181 Encounter for therapeutic drug level monitoring: Secondary | ICD-10-CM | POA: Diagnosis not present

## 2014-07-11 DIAGNOSIS — Z7901 Long term (current) use of anticoagulants: Secondary | ICD-10-CM

## 2014-07-11 DIAGNOSIS — I482 Chronic atrial fibrillation, unspecified: Secondary | ICD-10-CM

## 2014-07-11 DIAGNOSIS — I4891 Unspecified atrial fibrillation: Secondary | ICD-10-CM | POA: Diagnosis not present

## 2014-07-11 LAB — POCT INR: INR: 2.6

## 2014-07-18 ENCOUNTER — Inpatient Hospital Stay (HOSPITAL_COMMUNITY): Payer: Medicare Other

## 2014-07-18 ENCOUNTER — Emergency Department (HOSPITAL_COMMUNITY): Payer: Medicare Other

## 2014-07-18 ENCOUNTER — Inpatient Hospital Stay (HOSPITAL_COMMUNITY)
Admission: EM | Admit: 2014-07-18 | Discharge: 2014-07-22 | DRG: 064 | Disposition: A | Discharge status: Hospice | Payer: Medicare Other | Attending: Neurology | Admitting: Neurology

## 2014-07-18 ENCOUNTER — Encounter (HOSPITAL_COMMUNITY): Payer: Self-pay | Admitting: Emergency Medicine

## 2014-07-18 ENCOUNTER — Other Ambulatory Visit: Payer: Self-pay

## 2014-07-18 DIAGNOSIS — I10 Essential (primary) hypertension: Secondary | ICD-10-CM | POA: Diagnosis present

## 2014-07-18 DIAGNOSIS — Z66 Do not resuscitate: Secondary | ICD-10-CM | POA: Diagnosis present

## 2014-07-18 DIAGNOSIS — Z885 Allergy status to narcotic agent status: Secondary | ICD-10-CM

## 2014-07-18 DIAGNOSIS — Z8249 Family history of ischemic heart disease and other diseases of the circulatory system: Secondary | ICD-10-CM

## 2014-07-18 DIAGNOSIS — G8194 Hemiplegia, unspecified affecting left nondominant side: Secondary | ICD-10-CM | POA: Diagnosis present

## 2014-07-18 DIAGNOSIS — I615 Nontraumatic intracerebral hemorrhage, intraventricular: Principal | ICD-10-CM | POA: Diagnosis present

## 2014-07-18 DIAGNOSIS — E1142 Type 2 diabetes mellitus with diabetic polyneuropathy: Secondary | ICD-10-CM | POA: Diagnosis present

## 2014-07-18 DIAGNOSIS — G936 Cerebral edema: Secondary | ICD-10-CM | POA: Diagnosis present

## 2014-07-18 DIAGNOSIS — M810 Age-related osteoporosis without current pathological fracture: Secondary | ICD-10-CM | POA: Diagnosis present

## 2014-07-18 DIAGNOSIS — I878 Other specified disorders of veins: Secondary | ICD-10-CM | POA: Diagnosis present

## 2014-07-18 DIAGNOSIS — K219 Gastro-esophageal reflux disease without esophagitis: Secondary | ICD-10-CM | POA: Diagnosis present

## 2014-07-18 DIAGNOSIS — I482 Chronic atrial fibrillation: Secondary | ICD-10-CM | POA: Diagnosis present

## 2014-07-18 DIAGNOSIS — T45515A Adverse effect of anticoagulants, initial encounter: Secondary | ICD-10-CM | POA: Diagnosis present

## 2014-07-18 DIAGNOSIS — I610 Nontraumatic intracerebral hemorrhage in hemisphere, subcortical: Secondary | ICD-10-CM | POA: Diagnosis not present

## 2014-07-18 DIAGNOSIS — E871 Hypo-osmolality and hyponatremia: Secondary | ICD-10-CM | POA: Diagnosis not present

## 2014-07-18 DIAGNOSIS — R531 Weakness: Secondary | ICD-10-CM | POA: Diagnosis not present

## 2014-07-18 DIAGNOSIS — Z825 Family history of asthma and other chronic lower respiratory diseases: Secondary | ICD-10-CM

## 2014-07-18 DIAGNOSIS — Z823 Family history of stroke: Secondary | ICD-10-CM | POA: Diagnosis not present

## 2014-07-18 DIAGNOSIS — Z96649 Presence of unspecified artificial hip joint: Secondary | ICD-10-CM | POA: Diagnosis present

## 2014-07-18 DIAGNOSIS — G2581 Restless legs syndrome: Secondary | ICD-10-CM | POA: Diagnosis present

## 2014-07-18 DIAGNOSIS — I6789 Other cerebrovascular disease: Secondary | ICD-10-CM | POA: Diagnosis not present

## 2014-07-18 DIAGNOSIS — I639 Cerebral infarction, unspecified: Secondary | ICD-10-CM | POA: Insufficient documentation

## 2014-07-18 DIAGNOSIS — I619 Nontraumatic intracerebral hemorrhage, unspecified: Secondary | ICD-10-CM | POA: Diagnosis present

## 2014-07-18 DIAGNOSIS — Z515 Encounter for palliative care: Secondary | ICD-10-CM

## 2014-07-18 LAB — DIFFERENTIAL
BASOS ABS: 0 10*3/uL (ref 0.0–0.1)
BASOS PCT: 0 % (ref 0–1)
EOS PCT: 4 % (ref 0–5)
Eosinophils Absolute: 0.2 10*3/uL (ref 0.0–0.7)
LYMPHS PCT: 32 % (ref 12–46)
Lymphs Abs: 2 10*3/uL (ref 0.7–4.0)
Monocytes Absolute: 0.5 10*3/uL (ref 0.1–1.0)
Monocytes Relative: 9 % (ref 3–12)
Neutro Abs: 3.4 10*3/uL (ref 1.7–7.7)
Neutrophils Relative %: 55 % (ref 43–77)

## 2014-07-18 LAB — CBC
HCT: 32.8 % — ABNORMAL LOW (ref 36.0–46.0)
HEMOGLOBIN: 10.8 g/dL — AB (ref 12.0–15.0)
MCH: 32 pg (ref 26.0–34.0)
MCHC: 32.9 g/dL (ref 30.0–36.0)
MCV: 97 fL (ref 78.0–100.0)
Platelets: 253 10*3/uL (ref 150–400)
RBC: 3.38 MIL/uL — ABNORMAL LOW (ref 3.87–5.11)
RDW: 13.1 % (ref 11.5–15.5)
WBC: 6.1 10*3/uL (ref 4.0–10.5)

## 2014-07-18 LAB — COMPREHENSIVE METABOLIC PANEL
ALT: 15 U/L (ref 14–54)
AST: 26 U/L (ref 15–41)
Albumin: 3.8 g/dL (ref 3.5–5.0)
Alkaline Phosphatase: 74 U/L (ref 38–126)
Anion gap: 10 (ref 5–15)
BILIRUBIN TOTAL: 0.8 mg/dL (ref 0.3–1.2)
BUN: 14 mg/dL (ref 6–20)
CALCIUM: 8.7 mg/dL — AB (ref 8.9–10.3)
CO2: 25 mmol/L (ref 22–32)
Chloride: 97 mmol/L — ABNORMAL LOW (ref 101–111)
Creatinine, Ser: 1.26 mg/dL — ABNORMAL HIGH (ref 0.44–1.00)
GFR calc Af Amer: 43 mL/min — ABNORMAL LOW (ref >60)
GFR, EST NON AFRICAN AMERICAN: 37 mL/min — AB (ref >60)
Glucose, Bld: 170 mg/dL — ABNORMAL HIGH (ref 65–99)
Potassium: 4.1 mmol/L (ref 3.5–5.1)
Sodium: 132 mmol/L — ABNORMAL LOW (ref 135–145)
Total Protein: 7.1 g/dL (ref 6.5–8.1)

## 2014-07-18 LAB — I-STAT TROPONIN, ED: Troponin i, poc: 0.02 ng/mL (ref 0.00–0.08)

## 2014-07-18 LAB — URINALYSIS, ROUTINE W REFLEX MICROSCOPIC
BILIRUBIN URINE: NEGATIVE
Glucose, UA: NEGATIVE mg/dL
HGB URINE DIPSTICK: NEGATIVE
Ketones, ur: NEGATIVE mg/dL
LEUKOCYTES UA: NEGATIVE
Nitrite: NEGATIVE
PROTEIN: NEGATIVE mg/dL
Specific Gravity, Urine: 1.007 (ref 1.005–1.030)
Urobilinogen, UA: 0.2 mg/dL (ref 0.0–1.0)
pH: 7.5 (ref 5.0–8.0)

## 2014-07-18 LAB — I-STAT CHEM 8, ED
BUN: 17 mg/dL (ref 6–20)
Calcium, Ion: 1.13 mmol/L (ref 1.13–1.30)
Chloride: 97 mmol/L — ABNORMAL LOW (ref 101–111)
Creatinine, Ser: 1.3 mg/dL — ABNORMAL HIGH (ref 0.44–1.00)
Glucose, Bld: 169 mg/dL — ABNORMAL HIGH (ref 65–99)
HCT: 35 % — ABNORMAL LOW (ref 36.0–46.0)
HEMOGLOBIN: 11.9 g/dL — AB (ref 12.0–15.0)
POTASSIUM: 4.1 mmol/L (ref 3.5–5.1)
SODIUM: 134 mmol/L — AB (ref 135–145)
TCO2: 26 mmol/L (ref 0–100)

## 2014-07-18 LAB — APTT: aPTT: 37 seconds (ref 24–37)

## 2014-07-18 LAB — PROTIME-INR
INR: 2.09 — AB (ref 0.00–1.49)
INR: 1.14 (ref 0.00–1.49)
Prothrombin Time: 23.3 seconds — ABNORMAL HIGH (ref 11.6–15.2)
Prothrombin Time: 14.8 seconds (ref 11.6–15.2)

## 2014-07-18 LAB — RAPID URINE DRUG SCREEN, HOSP PERFORMED
AMPHETAMINES: NOT DETECTED
BENZODIAZEPINES: POSITIVE — AB
Barbiturates: NOT DETECTED
Cocaine: NOT DETECTED
OPIATES: POSITIVE — AB
TETRAHYDROCANNABINOL: NOT DETECTED

## 2014-07-18 LAB — ETHANOL

## 2014-07-18 MED ORDER — ACETAMINOPHEN 325 MG PO TABS: 650.0000 mg | ORAL_TABLET | ORAL | Status: DC | PRN

## 2014-07-18 MED ORDER — SENNOSIDES-DOCUSATE SODIUM 8.6-50 MG PO TABS
1.0000 | ORAL_TABLET | Freq: Two times a day (BID) | ORAL | Status: DC
  Filled 2014-07-18 (×3): qty 1

## 2014-07-18 MED ORDER — ACETAMINOPHEN 650 MG RE SUPP
650.0000 mg | RECTAL | Status: DC | PRN
  Administered 2014-07-18 – 2014-07-19 (×2): 650 mg via RECTAL
  Filled 2014-07-18 (×3): qty 1

## 2014-07-18 MED ORDER — STROKE: EARLY STAGES OF RECOVERY BOOK
Freq: Once | Status: AC
  Administered 2014-07-18: 22:00:00
  Filled 2014-07-18: qty 1

## 2014-07-18 MED ORDER — PANTOPRAZOLE SODIUM 40 MG IV SOLR
40.0000 mg | Freq: Every day | INTRAVENOUS | Status: DC
  Administered 2014-07-18: 40 mg via INTRAVENOUS
  Filled 2014-07-18 (×2): qty 40

## 2014-07-18 MED ORDER — VITAMIN K1 10 MG/ML IJ SOLN
10.0000 mg | INTRAVENOUS | Status: AC
  Administered 2014-07-18: 10 mg via INTRAVENOUS
  Filled 2014-07-18: qty 1

## 2014-07-18 MED ORDER — SODIUM CHLORIDE 0.9 % IV SOLN
INTRAVENOUS | Status: DC
  Administered 2014-07-18: 22:00:00 via INTRAVENOUS

## 2014-07-18 MED ORDER — PROTHROMBIN COMPLEX CONC HUMAN 500 UNITS IV KIT
2160.0000 [IU] | PACK | Status: AC
  Administered 2014-07-18: 2160 [IU] via INTRAVENOUS
  Filled 2014-07-18: qty 86

## 2014-07-18 MED ORDER — FENTANYL CITRATE (PF) 100 MCG/2ML IJ SOLN
25.0000 ug | Freq: Once | INTRAMUSCULAR | Status: AC
  Administered 2014-07-18: 25 ug via INTRAVENOUS
  Filled 2014-07-18: qty 2

## 2014-07-18 MED ORDER — LABETALOL HCL 5 MG/ML IV SOLN
10.0000 mg | INTRAVENOUS | Status: DC | PRN
  Administered 2014-07-18 – 2014-07-19 (×4): 10 mg via INTRAVENOUS
  Filled 2014-07-18 (×2): qty 4

## 2014-07-18 NOTE — ED Provider Notes (Signed)
CSN: [id number]     Arrival date & time 07/18/14  1836 History   First MD Initiated Contact with Patient 07/18/14 1840     Chief Complaint  Patient presents with  . Code Stroke     (Consider location/radiation/quality/duration/timing/severity/associated sxs/prior Treatment) HPI Comments:  Code stroke brought in by EMS with left-sided weakness that onset around 5:15. Patient awake and alert. She is complaining of pain in her posterior neck from her arthritis. She has weakness in her left arm and leg. She is on Coumadin for history of atrial fibrillation. No apparent falls. She denies chest pain or shortness of breath. She denies fever or vomiting.  The history is provided by the patient and the EMS personnel. The history is limited by the condition of the patient.    Past Medical History  Diagnosis Date  . Atrial fibrillation     a. with bradycardia - remains on bb;  b. chronic coumadin.  . Diverticulitis 2003-2008  . GERD (gastroesophageal reflux disease)   . Osteoporosis     T score -3.9 @ forearm  . Vitamin D deficiency     Vitamin D 27 in 2010  . Anemia     PMH  of  . Duodenal ulcer 1976    surgery   . 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 Despina HickAlusio  . Hx of colonoscopy 02/2006    Internal hemorrhoids  . Nipple discharge   . Arthritis   . Hemorrhoids   . Venous stasis     a. bilat LE w/ variscosities.  [name]. Hearing loss   . Syncope     a. 2003 - felt to be vasovagal;  b. 06/2001 Echo: EF 55-60%, no rwma;  . Restless leg syndrome    Past Surgical History  Procedure Laterality Date  . Appendectomy    . Cholecystectomy    . Tonsillectomy and adenoidectomy    . Total hip arthroplasty  2010    transfusions  . Breast biopsy  11/05/10    breast lump removed- rt br  . Laparoscopic gastrotomy w/ repair of ulcer    . Abdominal hysterectomy     Family History  Problem Relation Age of Onset  . Emphysema Father [id number]    deceased  . Other Mother [phone]    deceased old age  .  Diverticulosis Brother   . Heart disease Brother   . Coronary artery disease Brother     S/P CABG  . Breast cancer Sister   . Cancer Sister     breast  . Heart attack Brother   . Stroke Maternal Grandmother    History  Substance Use Topics  . Smoking status: Never Smoker   . Smokeless tobacco: Never Used  . Alcohol Use: No   OB History    No data available     Review of Systems  Unable to perform ROS: Acuity of condition      Allergies  Haloperidol lactate; Hydromorphone hcl; Ibuprofen; Melatonin; Morphine; Nortriptyline hcl; and Pentazocine lactate  Home Medications   Prior to Admission medications   Medication Sig Start Date End Date Taking? Authorizing Provider  Calcium Carbonate-Vitamin D (CALTRATE 600+D) 600-400 MG-UNIT per tablet Take 1 tablet by mouth 2 (two) times daily.     Yes Historical Provider, MD  Cholecalciferol (VITAMIN D3) 2000 UNITS TABS Take 1 tablet by mouth daily.     Yes Historical Provider, MD  diazepam (VALIUM) 5 MG tablet Take 5 mg by mouth every 6 (six) hours as  needed. For anxiety   Yes Historical Provider, MD  docusate sodium (COLACE) 100 MG capsule Take 100 mg by mouth daily.     Yes Historical Provider, MD  ferrous sulfate 325 (65 FE) MG tablet Take 325 mg by mouth daily with breakfast.     Yes Historical Provider, MD  gabapentin (NEURONTIN) 100 MG capsule Take 200 mg by mouth at bedtime.    Yes Historical Provider, MD  HYDROcodone-acetaminophen (NORCO) 5-325 MG per tablet Take 1 tablet by mouth every 5 (five) hours as needed (pain).    Yes Historical Provider, MD  Multiple Vitamin (MULTIVITAMIN) tablet Take 1 tablet by mouth daily.     Yes Historical Provider, MD  nitroGLYCERIN (NITROSTAT) 0.4 MG SL tablet Place 1 tablet (0.4 mg total) under the tongue every 5 (five) minutes x 3 doses as needed for chest pain. 11/15/11  Yes Ok Anis, NP  pantoprazole (PROTONIX) 40 MG tablet TAKE ONE TABLET BY MOUTH ONCE DAILY 05/27/14  Yes Pecola Lawless, MD  polyethylene glycol [address] / GLYCOLAX) packet Take 17 g by mouth 2 (two) times daily.   Yes Historical Provider, MD  Probiotic Product (ALIGN) 4 MG CAPS Take 1 capsule by mouth daily.     Yes Historical Provider, MD  pyridOXINE (VITAMIN B-6) 100 MG tablet Take 100 mg by mouth daily.     Yes Historical Provider, MD  spironolactone (ALDACTONE) 25 MG tablet Take 1 tablet (25 mg total) by mouth daily. 05/06/14  Yes Pecola Lawless, MD  warfarin (COUMADIN) 2.5 MG tablet Take as directed by Coumadin clinic Patient taking differently: Take 2.5-5 mg by mouth daily. Take 5mg  on M,F all other days take 2.5mg  01/03/14  Yes Wendall Stade, MD   BP 158/79 mmHg  Pulse 74  Temp(Src) 98.5 F (36.9 C) (Oral)  Resp 19  Ht 5\' 5"  (1.651 m)  Wt 177 lb 4 oz (80.4 kg)  BMI 29.50 kg/m2  SpO2 97% Physical Exam  Constitutional: She is oriented to person, place, and time. She appears well-developed and well-nourished. No distress.  C/o neck pain which is chronic per daughter.  HENT:  Head: Normocephalic and atraumatic.  Mouth/Throat: Oropharynx is clear and moist. No oropharyngeal exudate.  Eyes: Conjunctivae and EOM are normal. Pupils are equal, round, and reactive to light.  Neck: Normal range of motion. Neck supple.  Cardiovascular: Normal rate, regular rhythm and normal heart sounds.   No murmur heard. Pulmonary/Chest: Effort normal. No respiratory distress. She exhibits no tenderness.  Abdominal: Soft. There is no tenderness. There is no rebound and no guarding.  Musculoskeletal: Normal range of motion. She exhibits no edema or tenderness.  Neurological: She is alert and oriented to person, place, and time. No cranial nerve deficit.  Difficulty holding L arm and L leg off bed. CN 2-12 intact 3/5 LUE and LLE 5/5 RUE and RLE  Skin: Skin is warm. She is not diaphoretic.    ED Course  Procedures (including critical care time) Labs Review Labs Reviewed  PROTIME-INR - Abnormal; Notable  for the following:    Prothrombin Time 23.3 (*)    INR 2.09 (*)    All other components within normal limits  CBC - Abnormal; Notable for the following:    RBC 3.38 (*)    Hemoglobin 10.8 (*)    HCT 32.8 (*)    All other components within normal limits  COMPREHENSIVE METABOLIC PANEL - Abnormal; Notable for the following:    Sodium 132 (*)  Chloride 97 (*)    Glucose, Bld 170 (*)    Creatinine, Ser 1.26 (*)    Calcium 8.7 (*)    GFR calc non Af Amer 37 (*)    GFR calc Af Amer 43 (*)    All other components within normal limits  URINE RAPID DRUG SCREEN, HOSP PERFORMED - Abnormal; Notable for the following:    Opiates POSITIVE (*)    Benzodiazepines POSITIVE (*)    All other components within normal limits  I-STAT CHEM 8, ED - Abnormal; Notable for the following:    Sodium 134 (*)    Chloride 97 (*)    Creatinine, Ser 1.30 (*)    Glucose, Bld 169 (*)    Hemoglobin 11.9 (*)    HCT 35.0 (*)    All other components within normal limits  MRSA PCR SCREENING  ETHANOL  APTT  DIFFERENTIAL  URINALYSIS, ROUTINE W REFLEX MICROSCOPIC (NOT AT [facility])  PROTIME-INR  PROTIME-INR  PROTIME-INR  PROTIME-INR  PROTIME-INR  I-STAT TROPOININ, ED    Imaging Review Ct Head Wo Contrast  07/19/2014   CLINICAL DATA:  Initial evaluation for confusion, known intracranial hemorrhage.  EXAM: CT HEAD WITHOUT CONTRAST  TECHNIQUE: Contiguous axial images were obtained from the base of the skull through the vertex without intravenous contrast.  COMPARISON:  Prior CT from 07/18/2014  FINDINGS: Previously identified hemorrhage centered at the right thalamus is increased in size now measuring 2.9 x 3.0 cm. Associated vasogenic edema surrounding the hemorrhage is also slightly increased. Intraventricular extension with blood within the lateral, third, and fourth ventricles again seen, also slightly increased in amount. There is associated 2 mm right-to-left midline shift now present at the level of the hemorrhage.  Overall ventricular size is stable the slightly increased from prior without frank hydrocephalus. Basilar cisterns remain patent.  No acute large vessel territory infarct. No extra-axial fluid collection. No mass lesion.  No acute abnormality about the calvarium or scalp soft tissues. Orbits within normal limits.  Paranasal sinuses and mastoid air cells remain clear.  IMPRESSION: 1. Increased size of right thalamic hemorrhage now measuring 2.9 x 3.0 cm with increased localized vasogenic edema. 2. Intraventricular extension with overall slightly increased amount of intraventricular blood. Ventricular size is stable to slightly increased relative to 07/18/14. 3. New 2 mm of localized right-to-left midline shift at the level of the hemorrhage.   Electronically Signed   By: Rise Mu M.D.   On: 07/19/2014 01:31   Ct Head Wo Contrast  07/18/2014   CLINICAL DATA:  Left-sided weakness.  EXAM: CT HEAD WITHOUT CONTRAST  TECHNIQUE: Contiguous axial images were obtained from the base of the skull through the vertex without intravenous contrast.  COMPARISON:  None.  FINDINGS: Bony calvarium appears intact. 20 x 16 mm intraparenchymal hemorrhage is noted in the right thalamus. Intraventricular hemorrhage is noted in both lateral ventricles, third and fourth ventricles. No hydrocephalus is seen at this time. No mass effect or midline shift is noted. Mild diffuse cortical atrophy is noted.  IMPRESSION: Right thalamic intraparenchymal hemorrhage is noted with intraventricular extension and all 4 ventricles. Critical Value/emergent results were called by telephone at the time of interpretation on 07/18/2014 at 6:57 pm to Dr. Glynn Octave , who verbally acknowledged these results.   Electronically Signed   By: Lupita Raider, M.D.   On: 07/18/2014 18:58     EKG Interpretation   Date/Time:  Monday July 18 2014 19:03:38 EDT Ventricular Rate:  75 PR Interval:  QRS Duration: 103 QT Interval:  409 QTC  Calculation: 457 R Axis:   -7 Text Interpretation:  Atrial fibrillation Abnormal R-wave progression,  early transition No significant change was found Confirmed by Manus Gunning  MD,  Stepfon Rawles [phone]) on 07/18/2014 7:07:00 PM      MDM   Final diagnoses:  Hemorrhagic stroke    code stroke with acute onset left-sided weakness.    CT scan shows intraparenchymal hemorrhage with ventricular extension. INR is 2.0. Rapid reversal protocol ordered Not tPA candidate given ICH.   Kcentra, Vitamin K and FFP ordered.  airway remains intact. NO CP or SOB. C/o chronic "arthritis" neck pain.  Failed swallow screen.  D/w Dr. Hosie Poisson and Dr. Thad Ranger who will admit to ICU.  BP parameters given.   CRITICAL CARE Performed by: Glynn Octave Total critical care time: 40 Critical care time was exclusive of separately billable procedures and treating other patients. Critical care was necessary to treat or prevent imminent or life-threatening deterioration. Critical care was time spent personally by me on the following activities: development of treatment plan with patient and/or surrogate as well as nursing, discussions with consultants, evaluation of patient's response to treatment, examination of patient, obtaining history from patient or surrogate, ordering and performing treatments and interventions, ordering and review of laboratory studies, ordering and review of radiographic studies, pulse oximetry and re-evaluation of patient's condition.    Glynn Octave, MD 07/19/14 [phone]

## 2014-07-18 NOTE — Consult Note (Signed)
Stroke Consult    Chief Complaint: left sided weakness HPI: Lindsey [name] is an 81 y.o. female hx of HTN, A fib on coumadin presenting with acute onset of Left sided weakness involving arm and leg. LSW 1715. Per EMS, she was with family when she developed acute left sided flaccid weakness. Symptoms minimally improved upon EMS arrival to ED but with continued weakness. No facial weakness. No speech deficits. No visual deficits. No prior stroke or TIA.   Recent INR was 2.6 on 6/20.   Date last known well: 07/18/2014 Time last known well: 1715 tPA Given: no, CT shows ICH  Past Medical History  Diagnosis Date  . Atrial fibrillation     a. with bradycardia - remains on bb;  b. chronic coumadin.  . Diverticulitis 2003-2008  . GERD (gastroesophageal reflux disease)   . Osteoporosis     T score -3.9 @ forearm  . Vitamin D deficiency     Vitamin D 27 in 2010  . Anemia     PMH  of  . Duodenal ulcer 1976    surgery   . 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 Despina Hick  . Hx of colonoscopy 02/2006    Internal hemorrhoids  . Nipple discharge   . Arthritis   . Hemorrhoids   . Venous stasis     a. bilat LE w/ variscosities.  [name] Hearing loss   . Syncope     a. 2003 - felt to be vasovagal;  b. 06/2001 Echo: EF 55-60%, no rwma;  . Restless leg syndrome     Past Surgical History  Procedure Laterality Date  . Appendectomy    . Cholecystectomy    . Tonsillectomy and adenoidectomy    . Total hip arthroplasty  2010    transfusions  . Breast biopsy  11/05/10    breast lump removed- rt br  . Laparoscopic gastrotomy w/ repair of ulcer    . Abdominal hysterectomy      Family History  Problem Relation Age of Onset  . Emphysema Father 59    deceased  . Other Mother 45    deceased old age  . Diverticulosis Brother   . Heart disease Brother   . Coronary artery disease Brother     S/P CABG  . Breast cancer Sister   . Cancer Sister     breast  . Heart attack Brother   . Stroke  Maternal Grandmother    Social History:  reports that she has never smoked. She has never used smokeless tobacco. She reports that she does not drink alcohol or use illicit drugs.  Allergies:  Allergies  Allergen Reactions  . Haloperidol Lactate   . Hydromorphone Hcl     REACTION: SICK ON STOMACH  . Ibuprofen   . Melatonin   . Morphine   . Nortriptyline Hcl   . Pentazocine Lactate      (Not in a hospital admission)  ROS: Out of a complete 14 system review, the patient complains of only the following symptoms, and all other reviewed systems are negative. +weakness  Physical Examination: There were no vitals filed for this visit. Physical Exam  Constitutional: He appears well-developed and well-nourished.  Psych: Affect appropriate to situation Eyes: No scleral injection HENT: No OP obstrucion Head: Normocephalic.  Cardiovascular: Normal rate and regular rhythm.  Respiratory: Effort normal and breath sounds normal.  GI: Soft. Bowel sounds are normal. No distension. There is no tenderness.  Skin: WDI   Neurologic Examination: Mental  Status: Alert, oriented, thought content appropriate.  Speech fluent without evidence of aphasia.  Able to follow 3 step commands without difficulty. Cranial Nerves: II: funduscopic exam wnl bilaterally, visual fields grossly normal, pupils equal, round, reactive to light and accommodation III,IV, VI: ptosis not present, extra-ocular motions intact bilaterally V,VII: flattening of left NLF, decreased LT on left side VIII: hearing normal bilaterally IX,X: gag reflex present XI: trapezius strength/neck flexion strength normal bilaterally XII: tongue strength normal  Motor: Right : Upper extremity    Left:     Upper extremity 5/5 deltoid       3/5 deltoid 5/5 biceps      3/5 biceps  5/5 triceps      3/5 triceps 5/5 hand grip      3/5 hand grip  Lower extremity     Lower extremity 5-/5 hip flexor      3/5 hip flexor 5/5  quadricep      3/5 quadriceps  5/5 hamstrings     3/5 hamstrings 5/5 plantar flexion       3/5 plantar flexion 5/5 plantar extension     3/5 plantar extension Tone and bulk:normal tone throughout; no atrophy noted Sensory: diminished LT and PP on left side. Loss of 2 point discrimination on left side Deep Tendon Reflexes: 1+ and symmetric throughout Plantars: Right: downgoing   Left: downgoing Cerebellar: normal finger-to-nose on right side and normal heel-to-shin test on right side Gait: deferred  Laboratory Studies:   Basic Metabolic Panel: No results for input(s): NA, K, CL, CO2, GLUCOSE, BUN, CREATININE, CALCIUM, MG, PHOS in the last 168 hours.  Liver Function Tests: No results for input(s): AST, ALT, ALKPHOS, BILITOT, PROT, ALBUMIN in the last 168 hours. No results for input(s): LIPASE, AMYLASE in the last 168 hours. No results for input(s): AMMONIA in the last 168 hours.  CBC: No results for input(s): WBC, NEUTROABS, HGB, HCT, MCV, PLT in the last 168 hours.  Cardiac Enzymes: No results for input(s): CKTOTAL, CKMB, CKMBINDEX, TROPONINI in the last 168 hours.  BNP: Invalid input(s): POCBNP  CBG: No results for input(s): GLUCAP in the last 168 hours.  Microbiology: Results for orders placed or performed during the hospital encounter of 11/05/10  Surgical pcr screen     Status: None   Collection Time: 11/05/10  9:10 AM  Result Value Ref Range Status   MRSA, PCR NEGATIVE NEGATIVE Final   Staphylococcus aureus NEGATIVE NEGATIVE Final    Comment:        The Xpert SA Assay (FDA approved for NASAL specimens only), is one component of a comprehensive surveillance program.  It is not intended to diagnose infection nor to guide or monitor treatment.    Coagulation Studies: No results for input(s): LABPROT, INR in the last 72 hours.  Urinalysis: No results for input(s): COLORURINE, LABSPEC, PHURINE, GLUCOSEU, HGBUR, BILIRUBINUR, KETONESUR, PROTEINUR, UROBILINOGEN,  NITRITE, LEUKOCYTESUR in the last 168 hours.  Invalid input(s): APPERANCEUR  Lipid Panel:     Component Value Date/Time   CHOL 193 11/15/2011 0800   TRIG 61 11/15/2011 0800   HDL 93 11/15/2011 0800   CHOLHDL 2.1 11/15/2011 0800   VLDL 12 11/15/2011 0800   LDLCALC 88 11/15/2011 0800    HgbA1C:  Lab Results  Component Value Date   HGBA1C 6.5 04/12/2013    Urine Drug Screen:  No results found for: LABOPIA, COCAINSCRNUR, LABBENZ, AMPHETMU, THCU, LABBARB  Alcohol Level: No results for input(s): ETH in the last 168 hours.   Imaging: No  results found.  Assessment: 81 y.o. female hx of HTN, A fib (on coumadin) presenting with acute onset of left sided weakness. Code stroke activated. tPA not given due to presence of ICH. Likely related to coumadin use. Given K centra in the ED for INR of 2.09. Will need close monitoring with head CT showing right thalamic ICH with Intraventricular extension.    Plan: 1) Admit to ICU 2) no antiplatelets or anticoagulants 3) blood pressure control with goal systolic <160 4) Frequent neuro checks 5) If symptoms worsen or there is decreased mental status, repeat stat head CT 6) PT,OT,ST   This patient is critically ill and at significant risk of neurological worsening, death and care requires constant monitoring of vital signs, hemodynamics,respiratory and cardiac monitoring,review of multiple databases, neurological assessment, discussion with family, other specialists and medical decision making of high complexity. I spent 45 minutes of neurocritical care time in the care of this patient.   Elspeth Cho, DO [facility] [phone]  If 7pm- 7am, please page neurology on call as listed in AMION. 07/18/2014, 6:41 PM

## 2014-07-18 NOTE — ED Notes (Signed)
Pt had sudden onset of left sided weakness at 1715 while at home with family. Pt is alert and ox3.

## 2014-07-18 NOTE — ED Notes (Signed)
Pharmacy called. States mixing medicines at this time will deliver them to ED

## 2014-07-18 NOTE — Progress Notes (Signed)
Pt is far too agitated to tolerate MRI. MRI not attempted as pt is in constant motion in bed. Pt needs to be still for at least a minute at a time to get a dwi and for a decent gradient for her known bleed just over a minute. The motion correction technology will only correct right to left motion and is not affective for anterior to posterior. This lady is wringing her head.

## 2014-07-19 ENCOUNTER — Inpatient Hospital Stay (HOSPITAL_COMMUNITY): Payer: Medicare Other

## 2014-07-19 ENCOUNTER — Encounter (HOSPITAL_COMMUNITY): Payer: Self-pay

## 2014-07-19 DIAGNOSIS — G936 Cerebral edema: Secondary | ICD-10-CM

## 2014-07-19 DIAGNOSIS — I610 Nontraumatic intracerebral hemorrhage in hemisphere, subcortical: Secondary | ICD-10-CM

## 2014-07-19 DIAGNOSIS — I615 Nontraumatic intracerebral hemorrhage, intraventricular: Principal | ICD-10-CM

## 2014-07-19 DIAGNOSIS — I6789 Other cerebrovascular disease: Secondary | ICD-10-CM

## 2014-07-19 LAB — PROTIME-INR
INR: 1.14 (ref 0.00–1.49)
INR: 1.12 (ref 0.00–1.49)
PROTHROMBIN TIME: 14.6 s (ref 11.6–15.2)
Prothrombin Time: 14.8 seconds (ref 11.6–15.2)

## 2014-07-19 LAB — MRSA PCR SCREENING: MRSA by PCR: NEGATIVE

## 2014-07-19 MED ORDER — PNEUMOCOCCAL VAC POLYVALENT 25 MCG/0.5ML IJ INJ: 0.5000 mL | INJECTION | INTRAMUSCULAR | Status: DC

## 2014-07-19 MED ORDER — CETYLPYRIDINIUM CHLORIDE 0.05 % MT LIQD
7.0000 mL | Freq: Two times a day (BID) | OROMUCOSAL | Status: DC
  Administered 2014-07-19: 7 mL via OROMUCOSAL

## 2014-07-19 MED ORDER — FENTANYL CITRATE (PF) 100 MCG/2ML IJ SOLN
12.5000 ug | INTRAMUSCULAR | Status: DC | PRN
  Administered 2014-07-19 – 2014-07-20 (×9): 12.5 ug via INTRAVENOUS
  Filled 2014-07-19 (×9): qty 2

## 2014-07-19 MED ORDER — WHITE PETROLATUM GEL
Status: AC
  Administered 2014-07-19: 0.2
  Filled 2014-07-19: qty 1

## 2014-07-19 NOTE — Progress Notes (Signed)
Daughter at bedside has spoken to her sister (pt's other daughter) regarding plan of care for pt based on MD earlier discussion. They have decided to make pt comfort care. MD notified.

## 2014-07-19 NOTE — Progress Notes (Signed)
OT Dischage Note  Patient Details Name: Lindsey [name] MRN: [id number] DOB: 1923/05/12   Cancelled Treatment:    Reason Eval/Treat Not Completed: pt now full comfort care.  Angelene GiovanniConarpe, Shresta Risden M  Deshon Koslowski Remingtononarpe, OTR/L [phone]  07/19/2014, 10:54 AM

## 2014-07-19 NOTE — Progress Notes (Signed)
PT Cancellation Note  Patient Details Name: Lindsey [name] MRN: [id number] DOB: [birth year]   Cancelled Treatment:    Reason Eval/Treat Not Completed: Patient not medically ready.  Pt currently on bedrest.  Please advance activity order once appropriate for mobility and PT.     Matisyn Cabeza, Alison MurrayMegan F 07/19/2014, 8:04 AM

## 2014-07-19 NOTE — Progress Notes (Signed)
Call from [place], [place]. Daughters have met. They now desire comfort care for Ms. Halpin. Will write orders, transfer to floor and have SW see for disposition.  Dr. Leonie Man aware.  [place] for Pager information 07/19/2014 12:26 PM

## 2014-07-19 NOTE — Progress Notes (Signed)
*  PRELIMINARY RESULTS* Echocardiogram 2D Echocardiogram has been performed.  Lindsey [name], Abbey Veith 07/19/2014, 8:59 AM

## 2014-07-19 NOTE — Progress Notes (Signed)
SLP Cancellation Note  Patient Details Name: Lindsey [name] MRN: [id number] DOB: 12-12-1923   Cancelled treatment:       Reason Eval/Treat Not Completed: Pt/family wish to transition to full comfort care, and politely decline swallow evaluation at this time. SLP to sign off - please reorder if we can be of assistance.   Lindsey [name], M.A. CCC-SLP [phone]  Lindsey Hamaiewonsky, Lindsey [name] 07/19/2014, 12:18 PM

## 2014-07-19 NOTE — Progress Notes (Signed)
Late entry: @ 2200 call Dr.Reynolds for medicine for MRI due to pt. Is constantly moving, unable to do MRI, no new order received. Tried to MRI, MRI tech. Said unable to do it.  Back to room, contineuously grunting moaning fo r pain, all over,  F-cath order obtained due to incontinent urin and stool, 16 fr. f-cath placement, 525 cc pale color urine ontained, right after voind 150cc urine. NIH score changed, notified MD, CT of head ordered.

## 2014-07-19 NOTE — H&P (Signed)
Stroke Consult   Chief Complaint: left sided weakness HPI: Lindsey [name] is an 81 y.o. female hx of HTN, A fib on coumadin presenting with acute onset of Left sided weakness involving arm and leg. LSW 1715. Per EMS, she was with family when she developed acute left sided flaccid weakness. Symptoms minimally improved upon EMS arrival to ED but with continued weakness. No facial weakness. No speech deficits. No visual deficits. No prior stroke or TIA.   Recent INR was 2.6 on 6/20.  Date last known well: 07/18/2014 Time last known well: 1715 tPA Given: no, CT shows ICH  Past Medical History  Diagnosis Date  . Atrial fibrillation     a. with bradycardia - remains on bb; b. chronic coumadin.  . Diverticulitis 2003-2008  . GERD (gastroesophageal reflux disease)   . Osteoporosis     T score -3.9 @ forearm  . Vitamin D deficiency     Vitamin D 27 in 2010  . Anemia     PMH of  . Duodenal ulcer 1976    surgery   . 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 Despina Hick  . Hx of colonoscopy 02/2006    Internal hemorrhoids  . Nipple discharge   . Arthritis   . Hemorrhoids   . Venous stasis     a. bilat LE w/ variscosities.  [name] Hearing loss   . Syncope     a. 2003 - felt to be vasovagal; b. 06/2001 Echo: EF 55-60%, no rwma;  . Restless leg syndrome     Past Surgical History  Procedure Laterality Date  . Appendectomy    . Cholecystectomy    . Tonsillectomy and adenoidectomy    . Total hip arthroplasty  2010    transfusions  . Breast biopsy  11/05/10    breast lump removed- rt br  . Laparoscopic gastrotomy w/ repair of ulcer    . Abdominal hysterectomy      Family History  Problem Relation Age of Onset  . Emphysema Father 38    deceased  . Other Mother 51    deceased old age  . Diverticulosis Brother   . Heart disease Brother    . Coronary artery disease Brother     S/P CABG  . Breast cancer Sister   . Cancer Sister     breast  . Heart attack Brother   . Stroke Maternal Grandmother    Social History:  reports that she has never smoked. She has never used smokeless tobacco. She reports that she does not drink alcohol or use illicit drugs.  Allergies:  Allergies  Allergen Reactions  . Haloperidol Lactate   . Hydromorphone Hcl     REACTION: SICK ON STOMACH  . Ibuprofen   . Melatonin   . Morphine   . Nortriptyline Hcl   . Pentazocine Lactate      (Not in a hospital admission)  ROS: Out of a complete 14 system review, the patient complains of only the following symptoms, and all other reviewed systems are negative. +weakness  Physical Examination: There were no vitals filed for this visit. Physical Exam  Constitutional: He appears well-developed and well-nourished.  Psych: Affect appropriate to situation Eyes: No scleral injection HENT: No OP obstrucion Head: Normocephalic.  Cardiovascular: Normal rate and regular rhythm.  Respiratory: Effort normal and breath sounds normal.  GI: Soft. Bowel sounds are normal. No distension. There is no tenderness.  Skin: WDI   Neurologic Examination: Mental Status: Alert, oriented, thought content  appropriate. Speech fluent without evidence of aphasia. Able to follow 3 step commands without difficulty. Cranial Nerves: II: funduscopic exam wnl bilaterally, visual fields grossly normal, pupils equal, round, reactive to light and accommodation III,IV, VI: ptosis not present, extra-ocular motions intact bilaterally V,VII: flattening of left NLF, decreased LT on left side VIII: hearing normal bilaterally IX,X: gag reflex present XI: trapezius strength/neck flexion strength normal bilaterally XII: tongue strength normal  Motor: Right :Upper  extremityLeft: Upper extremity 5/5 deltoid 3/5 deltoid 5/5 biceps3/5 biceps  5/5 triceps3/5 triceps 5/5 hand grip3/5 hand grip Lower extremityLower extremity 5-/5 hip flexor3/5 hip flexor 5/5 quadricep3/5 quadriceps  5/5 hamstrings3/5 hamstrings 5/5 plantar flexion 3/5 plantar flexion 5/5 plantar extension3/5 plantar extension Tone and bulk:normal tone throughout; no atrophy noted Sensory: diminished LT and PP on left side. Loss of 2 point discrimination on left side Deep Tendon Reflexes: 1+ and symmetric throughout Plantars: Right: downgoingLeft: downgoing Cerebellar: normal finger-to-nose on right side and normal heel-to-shin test on right side Gait: deferred  Laboratory Studies:  Basic Metabolic Panel:  Last Labs     No results for input(s): NA, K, CL, CO2, GLUCOSE, BUN, CREATININE, CALCIUM, MG, PHOS in the last 168 hours.    Liver Function Tests:  Last Labs     No results for input(s): AST, ALT, ALKPHOS, BILITOT, PROT, ALBUMIN in the last 168 hours.    Last Labs     No results for input(s): LIPASE, AMYLASE in the last 168 hours.    Last Labs     No results for input(s): AMMONIA in the last 168 hours.    CBC:  Last Labs     No results for input(s): WBC, NEUTROABS, HGB, HCT, MCV,  PLT in the last 168 hours.    Cardiac Enzymes:  Last Labs     No results for input(s): CKTOTAL, CKMB, CKMBINDEX, TROPONINI in the last 168 hours.    BNP:  Last Labs     Invalid input(s): POCBNP    CBG:  Last Labs     No results for input(s): GLUCAP in the last 168 hours.    Microbiology: Results for orders placed or performed during the hospital encounter of 11/05/10  Surgical pcr screen Status: None   Collection Time: 11/05/10 9:10 AM  Result Value Ref Range Status   MRSA, PCR NEGATIVE NEGATIVE Final   Staphylococcus aureus NEGATIVE NEGATIVE Final    Comment:   The Xpert SA Assay (FDA approved for NASAL specimens only), is one component of a comprehensive surveillance program. It is not intended to diagnose infection nor to guide or monitor treatment.    Coagulation Studies:  Recent Labs (last 2 labs)     No results for input(s): LABPROT, INR in the last 72 hours.     Last Labs     Urinalysis: No results for input(s): COLORURINE, LABSPEC, PHURINE, GLUCOSEU, HGBUR, BILIRUBINUR, KETONESUR, PROTEINUR, UROBILINOGEN, NITRITE, LEUKOCYTESUR in the last 168 hours.  Invalid input(s): APPERANCEUR    Lipid Panel:   Labs (Brief)       Component Value Date/Time   CHOL 193 11/15/2011 0800   TRIG 61 11/15/2011 0800   HDL 93 11/15/2011 0800   CHOLHDL 2.1 11/15/2011 0800   VLDL 12 11/15/2011 0800   LDLCALC 88 11/15/2011 0800      HgbA1C:   Recent Labs    Lab Results  Component Value Date   HGBA1C 6.5 04/12/2013      Urine Drug Screen:    Labs (Brief)  No results found for: LABOPIA, COCAINSCRNUR, LABBENZ, AMPHETMU, THCU, LABBARB    Alcohol Level:    Last Labs     No results for input(s): ETH in the last 168 hours.     Imaging:  Imaging Results (Last 48 hours)    No results found.    Assessment: 81 y.o. female hx of HTN, A fib (on coumadin) presenting with acute onset of left  sided weakness. Code stroke activated. tPA not given due to presence of ICH. Likely related to coumadin use. Given K centra in the ED for INR of 2.09. Will need close monitoring with head CT showing right thalamic ICH with Intraventricular extension.    Plan: 1) Admit to ICU 2) no antiplatelets or anticoagulants 3) blood pressure control with goal systolic <160 4) Frequent neuro checks 5) If symptoms worsen or there is decreased mental status, repeat stat head CT 6) PT,OT,ST   This patient is critically ill and at significant risk of neurological worsening, death and care requires constant monitoring of vital signs, hemodynamics,respiratory and cardiac monitoring,review of multiple databases, neurological assessment, discussion with family, other specialists and medical decision making of high complexity. I spent 45 minutes of neurocritical care time in the care of this patient.   Elspeth Choeter Dailan Pfalzgraf, DO [facility] [phone]  If 7pm- 7am, please page neurology on call as listed in AMION. 07/18/2014, 6:41 PM

## 2014-07-19 NOTE — Progress Notes (Signed)
PT Cancellation and Discharge Note  Patient Details Name: Darci Needlelizabeth G Durney MRN: [id number] DOB: 02-Sep-1923   Cancelled Treatment:    Reason Eval/Treat Not Completed: Other (comment)  Noted plan is for comfort care at this time.  Will sign off PT at this time.  Please re-order if pt becomes appropriate for PT and mobility.     Josslyn Ciolek, Alison MurrayMegan F 07/19/2014, 10:49 AM

## 2014-07-19 NOTE — Progress Notes (Signed)
STROKE TEAM PROGRESS NOTE   HISTORY Lindsey [name] is an 81 y.o. female hx of HTN, A fib on coumadin presenting with acute onset of Left sided weakness involving arm and leg. LKW 1715 07/18/2014. Per EMS, she was with family when she developed acute left sided flaccid weakness. Symptoms minimally improved upon EMS arrival to ED but with continued weakness. No facial weakness. No speech deficits. No visual deficits. No prior stroke or TIA. Recent INR was 2.6 on 6/20. Patient was not administered TPA secondary to CT showing ICH.  She was admitted to the neuro ICU for further evaluation and treatment.   SUBJECTIVE (INTERVAL HISTORY) Her daughter is at the bedside.  Overall she feels her mother's condition is more uncomfortable, agitated this am. Daughter is tearful when discussing expected neuro worsening over the next few days and resultant long-term disability.   OBJECTIVE Temp:  [97.7 F (36.5 C)-98.5 F (36.9 C)] 98.2 F (36.8 C) (06/28 0753) Pulse Rate:  [67-96] 75 (06/28 0800) Cardiac Rhythm:  [-] Atrial fibrillation (06/28 0800) Resp:  [15-27] 22 (06/28 0800) BP: (120-185)/(52-125) 144/62 mmHg (06/28 0800) SpO2:  [92 %-100 %] 94 % (06/28 0800) Weight:  [77.111 kg (170 lb)-80.4 kg (177 lb 4 oz)] 80.4 kg (177 lb 4 oz) (06/27 2200)  No results for input(s): GLUCAP in the last 168 hours.  Recent Labs Lab 07/18/14 1839 07/18/14 1846  NA 132* 134*  K 4.1 4.1  CL 97* 97*  CO2 25  --   GLUCOSE 170* 169*  BUN 14 17  CREATININE 1.26* 1.30*  CALCIUM 8.7*  --     Recent Labs Lab 07/18/14 1839  AST 26  ALT 15  ALKPHOS 74  BILITOT 0.8  PROT 7.1  ALBUMIN 3.8    Recent Labs Lab 07/18/14 1839 07/18/14 1846  WBC 6.1  --   NEUTROABS 3.4  --   HGB 10.8* 11.9*  HCT 32.8* 35.0*  MCV 97.0  --   PLT 253  --    No results for input(s): CKTOTAL, CKMB, CKMBINDEX, TROPONINI in the last 168 hours.  Recent Labs  07/18/14 1839 07/18/14 2111 07/19/14 0305  LABPROT 23.3*  14.8 14.8  INR 2.09* 1.14 1.14    Recent Labs  07/18/14 2032  COLORURINE YELLOW  LABSPEC 1.007  PHURINE 7.5  GLUCOSEU NEGATIVE  HGBUR NEGATIVE  BILIRUBINUR NEGATIVE  KETONESUR NEGATIVE  PROTEINUR NEGATIVE  UROBILINOGEN 0.2  NITRITE NEGATIVE  LEUKOCYTESUR NEGATIVE       Component Value Date/Time   CHOL 193 11/15/2011 0800   TRIG 61 11/15/2011 0800   HDL 93 11/15/2011 0800   CHOLHDL 2.1 11/15/2011 0800   VLDL 12 11/15/2011 0800   LDLCALC 88 11/15/2011 0800   Lab Results  Component Value Date   HGBA1C 6.5 04/12/2013      Component Value Date/Time   LABOPIA POSITIVE* 07/18/2014 2032   COCAINSCRNUR NONE DETECTED 07/18/2014 2032   LABBENZ POSITIVE* 07/18/2014 2032   AMPHETMU NONE DETECTED 07/18/2014 2032   THCU NONE DETECTED 07/18/2014 2032   LABBARB NONE DETECTED 07/18/2014 2032     Recent Labs Lab 07/18/14 1839  ETH <5    Ct Head Wo Contrast  07/19/2014   CLINICAL DATA:  Initial evaluation for confusion, known intracranial hemorrhage.  EXAM: CT HEAD WITHOUT CONTRAST  TECHNIQUE: Contiguous axial images were obtained from the base of the skull through the vertex without intravenous contrast.  COMPARISON:  Prior CT from 07/18/2014  FINDINGS: Previously identified hemorrhage centered at the right  thalamus is increased in size now measuring 2.9 x 3.0 cm. Associated vasogenic edema surrounding the hemorrhage is also slightly increased. Intraventricular extension with blood within the lateral, third, and fourth ventricles again seen, also slightly increased in amount. There is associated 2 mm right-to-left midline shift now present at the level of the hemorrhage. Overall ventricular size is stable the slightly increased from prior without frank hydrocephalus. Basilar cisterns remain patent.  No acute large vessel territory infarct. No extra-axial fluid collection. No mass lesion.  No acute abnormality about the calvarium or scalp soft tissues. Orbits within normal limits.   Paranasal sinuses and mastoid air cells remain clear.  IMPRESSION: 1. Increased size of right thalamic hemorrhage now measuring 2.9 x 3.0 cm with increased localized vasogenic edema. 2. Intraventricular extension with overall slightly increased amount of intraventricular blood. Ventricular size is stable to slightly increased relative to 07/18/14. 3. New 2 mm of localized right-to-left midline shift at the level of the hemorrhage.   Electronically Signed   By: Rise Mu M.D.   On: 07/19/2014 01:31   Ct Head Wo Contrast  07/18/2014   CLINICAL DATA:  Left-sided weakness.  EXAM: CT HEAD WITHOUT CONTRAST  TECHNIQUE: Contiguous axial images were obtained from the base of the skull through the vertex without intravenous contrast.  COMPARISON:  None.  FINDINGS: Bony calvarium appears intact. 20 x 16 mm intraparenchymal hemorrhage is noted in the right thalamus. Intraventricular hemorrhage is noted in both lateral ventricles, third and fourth ventricles. No hydrocephalus is seen at this time. No mass effect or midline shift is noted. Mild diffuse cortical atrophy is noted.  IMPRESSION: Right thalamic intraparenchymal hemorrhage is noted with intraventricular extension and all 4 ventricles. Critical Value/emergent results were called by telephone at the time of interpretation on 07/18/2014 at 6:57 pm to Dr. Glynn Octave , who verbally acknowledged these results.   Electronically Signed   By: Lupita Raider, M.D.   On: 07/18/2014 18:58     PHYSICAL EXAM Elderly caucasian lady not in distress. . Afebrile. Head is nontraumatic. Neck is supple without bruit.    Cardiac exam no murmur or gallop. Lungs are clear to auscultation. Distal pulses are well felt. Neurological Exam : Lethargic but can be aroused. Opens eyes briefly but cannot sustain it. Right gaze preference and will not look to the left. Does not blink to threat on either side. Fundi were not visualized. Pupils ports 6 mm irregular but reactive.  Corneal reflexes are present. Left lower facial weakness. Tongue midline. Patient does not follow commands consistently. Her speech is dysarthric and at times difficult to understand. Not speaking fluently. Dense left hemiplegia with diminished tone and hypotonia. Purposeful antigravity movements on the right side. Does not withdraw to pain on the left but does so on the right. Left plantar upgoing right downgoing. ASSESSMENT/PLAN Ms. CELISSE CIULLA is a 81 y.o. female with history of HTN, A fib on coumadin presenting with left sided weakness. CT shows hemorrhage.  She did not receive IV t-PA due to ICH.   Stroke:  right thalamic intracerebral hemorrhage with intraventricular extension, cerebral edema and midline shift. Hemorrhage secondary to warfarin related coagulapathy  Resultant  Lethargic state, L hemiparesis, inattention  CT R thalamic ICH w/ IVH  Repeat CT  Shows Increased right thalamic hemorrhage to  2.9 x 3.0 cm with increased edema, IVH slightly increased, vent size slightly increased. New 2 mm shift   HgbA1c pending  SCDs for VTE prophylaxis Diet NPO time  specified  warfarin prior to admission, with INR 2.09 on arrival. Reversed with Kcentra. INR now 1.14.  Ongoing aggressive stroke risk factor management  Patient had opted for DNR during her last admission. Daughter agreeable to DNR for now. Ongoing medical support to see if she worsens or improves over the next day or so. Daughter to speak with her sister related to goals of care.  Therapy recommendations:  pending   Disposition:  pending   Atrial Fibrillation  Home anticoagulation:  Warfarin  INR 2.09 on admission   No longer an anticoagulation candidate   Hypertension  No hx of hypertension  Felt to be result of hemorrhage  SBP goal < 160  Received labetolol 4 times during the night  Stable right now < 160  Other Stroke Risk Factors  Advanced age  Family hx stroke (maternal  grandmother)  Other Active Problems  Hyponatremia  CKD, Cr 1.3  Hyperglycemia  Elevated temperature, receiving tylenol  Hospital day # 1  BIBY,SHARON  [facility] See Amion for Pager information 07/19/2014 9:12 AM  I have personally examined this patient, reviewed notes, independently viewed imaging studies, participated in medical decision making and plan of care. I have made any additions or clarifications directly to the above note. Agree with note above. She has presented with left hemiplegia and altered mental status due to right thalamic hemorrhage and follow-up imaging shows slight increase in hematoma with increasing cytotoxic edema, mass effect and midline shift. Her prognosis is poor and she is at risk for significant neurological worsening, hydrocephalus and herniation. I had a long discussion of the bedside with the patient's daughter who clearly states that mother would not have wanted to live a life of disability, go to nursing home or have prolonged ventilatory support and she is seriously considering comfort care. She plans to discuss this with her sister and make a decision soon. This patient is critically ill and at significant risk of neurological worsening, death and care requires constant monitoring of vital signs, hemodynamics,respiratory and cardiac monitoring, extensive review of multiple databases, frequent neurological assessment, discussion with family, other specialists and medical decision making of high complexity.I have made any additions or clarifications directly to the above note.This critical care time does not reflect procedure time, or teaching time or supervisory time of PA/NP/Med Resident etc but could involve care discussion time.  I spent 40 minutes of neurocritical care time  in the care of  this patient.    Delia HeadyPramod Aviendha Azbell, MD Medical Director [facility] Pager: [phone] 07/19/2014 12:49 PM   To contact Stroke  Continuity provider, please refer to [email or web address]. After hours, contact General Neurology

## 2014-07-20 DIAGNOSIS — Z515 Encounter for palliative care: Secondary | ICD-10-CM

## 2014-07-20 DIAGNOSIS — I639 Cerebral infarction, unspecified: Secondary | ICD-10-CM | POA: Insufficient documentation

## 2014-07-20 MED ORDER — FENTANYL CITRATE (PF) 100 MCG/2ML IJ SOLN
25.0000 ug | INTRAMUSCULAR | Status: DC | PRN
  Administered 2014-07-20 – 2014-07-21 (×5): 25 ug via INTRAVENOUS
  Filled 2014-07-20 (×5): qty 2

## 2014-07-20 NOTE — Care Management (Signed)
Important Message  Patient Details  Name: Lindsey [name] MRN: [id number] Date of Birth: 03-11-1923   Medicare Important Message Given:  Yes-second notification given    Rayvon CharSTUTTS, Berwyn Bigley G 07/20/2014, 3:02 PM

## 2014-07-20 NOTE — Progress Notes (Signed)
STROKE TEAM PROGRESS NOTE   HISTORY Lindsey [name] is an 81 y.o. female hx of HTN, A fib on coumadin presenting with acute onset of Left sided weakness involving arm and leg. LKW 1715 07/18/2014. Per EMS, she was with family when she developed acute left sided flaccid weakness. Symptoms minimally improved upon EMS arrival to ED but with continued weakness. No facial weakness. No speech deficits. No visual deficits. No prior stroke or TIA. Recent INR was 2.6 on 6/20. Patient was not administered TPA secondary to CT showing ICH.  She was admitted to the neuro ICU for further evaluation and treatment.   SUBJECTIVE (INTERVAL HISTORY) Her daughter is at the bedside.  Overall she feels her mother's condition is more uncomfortable this am.  Family is agreeable to hospice nursing home  OBJECTIVE Temp:  [98.7 F (37.1 C)-99.5 F (37.5 C)] 98.7 F (37.1 C) (06/29 0422) Pulse Rate:  [62-71] 71 (06/29 0422) Cardiac Rhythm:  [-]  Resp:  [18] 18 (06/29 0422) BP: (136-148)/(51-56) 136/51 mmHg (06/29 0422) SpO2:  [92 %-94 %] 92 % (06/29 0422)  No results for input(s): GLUCAP in the last 168 hours.  Recent Labs Lab 07/18/14 1839 07/18/14 1846  NA 132* 134*  K 4.1 4.1  CL 97* 97*  CO2 25  --   GLUCOSE 170* 169*  BUN 14 17  CREATININE 1.26* 1.30*  CALCIUM 8.7*  --     Recent Labs Lab 07/18/14 1839  AST 26  ALT 15  ALKPHOS 74  BILITOT 0.8  PROT 7.1  ALBUMIN 3.8    Recent Labs Lab 07/18/14 1839 07/18/14 1846  WBC 6.1  --   NEUTROABS 3.4  --   HGB 10.8* 11.9*  HCT 32.8* 35.0*  MCV 97.0  --   PLT 253  --    No results for input(s): CKTOTAL, CKMB, CKMBINDEX, TROPONINI in the last 168 hours.  Recent Labs  07/18/14 1839 07/18/14 2111 07/19/14 0305 07/19/14 0930  LABPROT 23.3* 14.8 14.8 14.6  INR 2.09* 1.14 1.14 1.12    Recent Labs  07/18/14 2032  COLORURINE YELLOW  LABSPEC 1.007  PHURINE 7.5  GLUCOSEU NEGATIVE  HGBUR NEGATIVE  BILIRUBINUR NEGATIVE  KETONESUR  NEGATIVE  PROTEINUR NEGATIVE  UROBILINOGEN 0.2  NITRITE NEGATIVE  LEUKOCYTESUR NEGATIVE       Component Value Date/Time   CHOL 193 11/15/2011 0800   TRIG 61 11/15/2011 0800   HDL 93 11/15/2011 0800   CHOLHDL 2.1 11/15/2011 0800   VLDL 12 11/15/2011 0800   LDLCALC 88 11/15/2011 0800   Lab Results  Component Value Date   HGBA1C 6.5 04/12/2013      Component Value Date/Time   LABOPIA POSITIVE* 07/18/2014 2032   COCAINSCRNUR NONE DETECTED 07/18/2014 2032   LABBENZ POSITIVE* 07/18/2014 2032   AMPHETMU NONE DETECTED 07/18/2014 2032   THCU NONE DETECTED 07/18/2014 2032   LABBARB NONE DETECTED 07/18/2014 2032     Recent Labs Lab 07/18/14 1839  ETH <5    Ct Head Wo Contrast  07/19/2014   CLINICAL DATA:  Initial evaluation for confusion, known intracranial hemorrhage.  EXAM: CT HEAD WITHOUT CONTRAST  TECHNIQUE: Contiguous axial images were obtained from the base of the skull through the vertex without intravenous contrast.  COMPARISON:  Prior CT from 07/18/2014  FINDINGS: Previously identified hemorrhage centered at the right thalamus is increased in size now measuring 2.9 x 3.0 cm. Associated vasogenic edema surrounding the hemorrhage is also slightly increased. Intraventricular extension with blood within the lateral, third,  and fourth ventricles again seen, also slightly increased in amount. There is associated 2 mm right-to-left midline shift now present at the level of the hemorrhage. Overall ventricular size is stable the slightly increased from prior without frank hydrocephalus. Basilar cisterns remain patent.  No acute large vessel territory infarct. No extra-axial fluid collection. No mass lesion.  No acute abnormality about the calvarium or scalp soft tissues. Orbits within normal limits.  Paranasal sinuses and mastoid air cells remain clear.  IMPRESSION: 1. Increased size of right thalamic hemorrhage now measuring 2.9 x 3.0 cm with increased localized vasogenic edema. 2.  Intraventricular extension with overall slightly increased amount of intraventricular blood. Ventricular size is stable to slightly increased relative to 07/18/14. 3. New 2 mm of localized right-to-left midline shift at the level of the hemorrhage.   Electronically Signed   By: Rise MuBenjamin  McClintock M.D.   On: 07/19/2014 01:31   Ct Head Wo Contrast  07/18/2014   CLINICAL DATA:  Left-sided weakness.  EXAM: CT HEAD WITHOUT CONTRAST  TECHNIQUE: Contiguous axial images were obtained from the base of the skull through the vertex without intravenous contrast.  COMPARISON:  None.  FINDINGS: Bony calvarium appears intact. 20 x 16 mm intraparenchymal hemorrhage is noted in the right thalamus. Intraventricular hemorrhage is noted in both lateral ventricles, third and fourth ventricles. No hydrocephalus is seen at this time. No mass effect or midline shift is noted. Mild diffuse cortical atrophy is noted.  IMPRESSION: Right thalamic intraparenchymal hemorrhage is noted with intraventricular extension and all 4 ventricles. Critical Value/emergent results were called by telephone at the time of interpretation on 07/18/2014 at 6:57 pm to Dr. Glynn OctaveSTEPHEN RANCOUR , who verbally acknowledged these results.   Electronically Signed   By: Lupita RaiderJames  Green Jr, M.D.   On: 07/18/2014 18:58     PHYSICAL EXAM Elderly caucasian lady not in distress. . Afebrile. Head is nontraumatic. Neck is supple without bruit.    Cardiac exam no murmur or gallop. Lungs are clear to auscultation. Distal pulses are well felt. Neurological Exam : Lethargic but can be aroused. Opens eyes briefly but cannot sustain it. Right gaze preference and will not look to the left. Does not blink to threat on either side. Fundi were not visualized. Pupils ports 6 mm irregular but reactive. Corneal reflexes are present. Left lower facial weakness. Tongue midline. Patient does not follow commands consistently. Her speech is dysarthric and at times difficult to understand. Not  speaking fluently. Dense left hemiplegia with diminished tone and hypotonia. Purposeful antigravity movements on the right side. Does not withdraw to pain on the left but does so on the right. Left plantar upgoing right downgoing. ASSESSMENT/PLAN Ms. Darci Needlelizabeth G Udall is a 81 y.o. female with history of HTN, A fib on coumadin presenting with left sided weakness. CT shows hemorrhage.  She did not receive IV t-PA due to ICH.   Stroke:  right thalamic intracerebral hemorrhage with intraventricular extension, cerebral edema and midline shift. Hemorrhage secondary to warfarin related coagulapathy  Resultant  Lethargic state, L hemiparesis, inattention  CT R thalamic ICH w/ IVH  Repeat CT  Shows Increased right thalamic hemorrhage to  2.9 x 3.0 cm with increased edema, IVH slightly increased, vent size slightly increased. New 2 mm shift   HgbA1c pending  SCDs for VTE prophylaxis Diet NPO time specified  warfarin prior to admission, with INR 2.09 on arrival. Reversed with [facility]ra. INR now 1.14.  Ongoing aggressive stroke risk factor management  Patient had opted for  DNR during her last admission. Daughter agreeable to DNR for now. Ongoing medical support to see if she worsens or improves over the next day or so. Daughter to speak with her sister related to goals of care.  Therapy recommendations:  pending   Disposition:  pending   Atrial Fibrillation  Home anticoagulation:  Warfarin  INR 2.09 on admission   No longer an anticoagulation candidate   Hypertension  No hx of hypertension  Felt to be result of hemorrhage  SBP goal < 160  Received labetolol 4 times during the night  Stable right now < 160  Other Stroke Risk Factors  Advanced age  Family hx stroke (maternal grandmother)  Other Active Problems  Hyponatremia  CKD, Cr 1.3  Hyperglycemia  Elevated temperature, receiving tylenol  Hospital day # 2  SETHI,PRAMOD  [facility] See Amion for  Pager information 07/20/2014 2:48 PM  I have personally examined this patient, reviewed notes, independently viewed imaging studies, participated in medical decision making and plan of care. I have made any additions or clarifications directly to the above note. Agree with note above. She has presented with left hemiplegia and altered mental status due to right thalamic hemorrhage and follow-up imaging shows slight increase in hematoma with increasing cytotoxic edema, mass effect and midline shift. Her prognosis is poor and   states that mother would not have wanted to live a life of disability, go to nursing home or have prolonged ventilatory support and    Hence they have decided on comfort care. Family for hospice placement and will consult palliative care for the same  Delia Heady, MD Medical Director [facility] Pager: [phone] 07/20/2014 2:48 PM   To contact Stroke Continuity provider, please refer to [email or web address]. After hours, contact General Neurology

## 2014-07-20 NOTE — Clinical Social Work Note (Signed)
CSW received consult for possible residential hospice placement versus home with hospice.  Palliative consult placed to assist with evaluation of appropriate disposition plan.  CSW to continue to follow and assist pending Palliative consult.  Marcelline Deistmily Onna Nodal, [place] - [phone] Clinical Social Work Department Orthopedics [phone]) and Surgical [phone]([id number])

## 2014-07-20 NOTE — Consult Note (Signed)
Consultation Note Date: 07/20/2014   Patient Name: Lindsey [name]  DOB: 08/26/1923  MRN: [id number]  Age / Sex: 81 y.o., female   PCP: Pecola Lawless, MD Referring Physician: Micki Riley, MD  Reason for Consultation: Establishing goals of care, disposition  Palliative Care Assessment and Plan Summary of Established Goals of Care and Medical Treatment Preferences    Palliative Care Discussion Held Today:   Lindsey [name] is sleeping today when I visited. Her daughters are at bedside and tell me that they have seen her decline and know that she always told them that she would not want to be in a state where she was in bed and debilitated - they have made the decision for comfort care. Their family has had experience with someone else that had a stroke and lived 3 years in a nursing facility with a PEG tube and they know that Lindsey [name] would never want that. We discussed comfort feeds and oral care. They tell me that when she is awake she is miserable and in pain (h/o arthritic pain) and the fentanyl 12.5 has been effective for controlling her pain but it does make her sleep - they would rather her sleep than be miserable. Educated that this is available every hour and we can titrate dosage up as needed to ensure comfort. Support provided.    Contacts/Participants in Discussion: Primary Decision Maker: Daughters   Goals of Care/Code Status/Advance Care Planning:   Code Status: DNR  Comfort care   Symptom Management:   Pain/dyspnea: Continue fentanyl 12.5 mcg every hour prn as family says this has been effective. May increase dosage/frequency as needed for comfort.   Agitation: Lorazepam 0.5 mg every 4 hours prn.   Psycho-social/Spiritual:   Support System: Family at bedside - good support.   Prognosis: < 3-4 weeks  Discharge Planning:  Hospice facility - after discussion family would opt for [facility]       Chief Complaint/History of Present  Illness: 81 y.o. female hx of HTN, A fib on coumadin presenting with acute onset of Left sided weakness involving arm and leg. LKW 1715 07/18/2014. Per EMS, she was with family when she developed acute left sided flaccid weakness. Symptoms minimally improved upon EMS arrival to ED but with continued weakness. No facial weakness. No speech deficits. No visual deficits. No prior stroke or TIA. Recent INR was 2.6 on 6/20. Patient was not administered TPA secondary to CT showing ICH. She was admitted to the neuro ICU for further evaluation and treatment but has transitioned to floor as family has opted for comfort care.    Primary Diagnoses  Present on Admission:  . ICH (intracerebral hemorrhage)  Palliative Review of Systems:   Unable to assess - pt sleeping.    I have reviewed the medical record, interviewed the patient and family, and examined the patient. The following aspects are pertinent.  Past Medical History  Diagnosis Date  . Atrial fibrillation     a. with bradycardia - remains on bb;  b. chronic coumadin.  . Diverticulitis 2003-2008  . GERD (gastroesophageal reflux disease)   . Osteoporosis     T score -3.9 @ forearm  . Vitamin D deficiency     Vitamin D 27 in 2010  . Anemia     PMH  of  . Duodenal ulcer 1976    surgery   . 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 Despina Hick  . Hx of colonoscopy 02/2006  Internal hemorrhoids  . Nipple discharge   . Arthritis   . Hemorrhoids   . Venous stasis     a. bilat LE w/ variscosities.  [name]. Hearing loss   . Syncope     a. 2003 - felt to be vasovagal;  b. 06/2001 Echo: EF 55-60%, no rwma;  . Restless leg syndrome    History   Social History  . Marital Status: Widowed    Spouse Name: N/A  . Number of Children: N/A  . Years of Education: N/A   Social History Main Topics  . Smoking status: Never Smoker   . Smokeless tobacco: Never Used  . Alcohol Use: No  . Drug Use: No  . Sexual Activity: No   Other Topics Concern  .  None   Social History Narrative   [place]ives in [place] by herself, though dtr and son-in-law live next door.   Family History  Problem Relation Age of Onset  . Emphysema Father 181    deceased  . Other Mother [id number]    deceased old age  . Diverticulosis Brother   . Heart disease Brother   . Coronary artery disease Brother     S/P CABG  . Breast cancer Sister   . Cancer Sister     breast  . Heart attack Brother   . Stroke Maternal Grandmother    Scheduled Meds:  Continuous Infusions:  PRN Meds:.acetaminophen **OR** acetaminophen, fentaNYL (SUBLIMAZE) injection Medications Prior to Admission:  Prior to Admission medications   Medication Sig Start Date End Date Taking? Authorizing Provider  Calcium Carbonate-Vitamin D (CALTRATE 600+D) 600-400 MG-UNIT per tablet Take 1 tablet by mouth 2 (two) times daily.     Yes Historical Provider, MD  Cholecalciferol (VITAMIN D3) 2000 UNITS TABS Take 1 tablet by mouth daily.     Yes Historical Provider, MD  diazepam (VALIUM) 5 MG tablet Take 5 mg by mouth every 6 (six) hours as needed. For anxiety   Yes Historical Provider, MD  docusate sodium (COLACE) 100 MG capsule Take 100 mg by mouth daily.     Yes Historical Provider, MD  ferrous sulfate 325 (65 FE) MG tablet Take 325 mg by mouth daily with breakfast.     Yes Historical Provider, MD  gabapentin (NEURONTIN) 100 MG capsule Take 200 mg by mouth at bedtime.    Yes Historical Provider, MD  HYDROcodone-acetaminophen (NORCO) 5-325 MG per tablet Take 1 tablet by mouth every 5 (five) hours as needed (pain).    Yes Historical Provider, MD  Multiple Vitamin (MULTIVITAMIN) tablet Take 1 tablet by mouth daily.     Yes Historical Provider, MD  nitroGLYCERIN (NITROSTAT) 0.4 MG SL tablet Place 1 tablet (0.4 mg total) under the tongue every 5 (five) minutes x 3 doses as needed for chest pain. 11/15/11  Yes Ok Anishristopher R Berge, NP  pantoprazole (PROTONIX) 40 MG tablet TAKE ONE TABLET BY MOUTH ONCE DAILY 05/27/14  Yes Pecola LawlessWilliam  F Hopper, MD  polyethylene glycol [facility](MIRALAX / GLYCOLAX) packet Take 17 g by mouth 2 (two) times daily.   Yes Historical Provider, MD  Probiotic Product (ALIGN) 4 MG CAPS Take 1 capsule by mouth daily.     Yes Historical Provider, MD  pyridOXINE (VITAMIN B-6) 100 MG tablet Take 100 mg by mouth daily.     Yes Historical Provider, MD  spironolactone (ALDACTONE) 25 MG tablet Take 1 tablet (25 mg total) by mouth daily. 05/06/14  Yes Pecola LawlessWilliam F Hopper, MD  warfarin (COUMADIN) 2.5 MG tablet Take  as directed by Coumadin clinic Patient taking differently: Take 2.5-5 mg by mouth daily. Take  on M,F all other days take 2.5mg  01/03/14  Yes Wendall Stade, MD   Allergies  Allergen Reactions  . Haloperidol Lactate     Makes me nervous   . Hydromorphone Hcl     REACTION: SICK ON STOMACH  . Ibuprofen     unknown  . Melatonin     unknown  . Morphine     Sick on stomach   . Nortriptyline Hcl     unknown  . Pentazocine Lactate     unknown   CBC:    Component Value Date/Time   WBC 6.1 07/18/2014 1839   HGB 11.9* 07/18/2014 1846   HCT 35.0* 07/18/2014 1846   PLT 253 07/18/2014 1839   MCV 97.0 07/18/2014 1839   NEUTROABS 3.4 07/18/2014 1839   LYMPHSABS 2.0 07/18/2014 1839   MONOABS 0.5 07/18/2014 1839   EOSABS 0.2 07/18/2014 1839   BASOSABS 0.0 07/18/2014 1839   Comprehensive Metabolic Panel:    Component Value Date/Time   NA 134* 07/18/2014 1846   K 4.1 07/18/2014 1846   CL 97* 07/18/2014 1846   CO2 25 07/18/2014 1839   BUN 17 07/18/2014 1846   CREATININE 1.30* 07/18/2014 1846   GLUCOSE 169* 07/18/2014 1846   CALCIUM 8.7* 07/18/2014 1839   AST 26 07/18/2014 1839   ALT 15 07/18/2014 1839   ALKPHOS 74 07/18/2014 1839   BILITOT 0.8 07/18/2014 1839   PROT 7.1 07/18/2014 1839   ALBUMIN 3.8 07/18/2014 1839    Physical Exam:  Vital Signs: BP 136/51 mmHg  Pulse 71  Temp(Src) 98.7 F (37.1 C) (Oral)  Resp 18  Ht  (1.651 m)  Wt 80.4 kg (177 lb 4 oz)  BMI 29.50 kg/m2  SpO2  92% SpO2: SpO2: 92 % O2 Device: O2 Device: Not Delivered O2 Flow Rate: O2 Flow Rate (L/min): 1 L/min Intake/output summary:  Intake/Output Summary (Last 24 hours) at 07/20/14 1333 Last data filed at 07/20/14 0423  Gross per 24 hour  Intake      0 ml  Output    200 ml  Net   -200 ml   LBM: Last BM Date: 07/18/14 Baseline Weight: Weight: 77.111 kg (170 lb) Most recent weight: Weight: 80.4 kg (177 lb 4 oz)  Exam Findings:   General: NAD, lying in bed HEENT: Temporal muscle wasting CVS: RRR Resp: No labored breathing, rhonchi throughout, decreased bases Abd: Soft, NT, ND Extrem: Warm, dry Neuro: Sleeping - did not attempt to awaken as family says she is in pain and miserable when awake           Palliative Performance Scale: 10-20 %                Additional Data Reviewed: Recent Labs     07/18/14  1839  07/18/14  1846  WBC  6.1   --   HGB  10.8*  11.9*  PLT  253   --   NA  132*  134*  BUN  14  17  CREATININE  1.26*  1.30*     Time In: 1140 Time Out: 1240 Time Total:  Greater than 50%  of this time was spent counseling and coordinating care related to the above assessment and plan.   Signed by:  Yong Channel, NP Palliative Medicine Team Pager # [phone] (M-F 8a-5p) Team Phone # [phone] (Nights/Weekends)

## 2014-07-20 NOTE — Clinical Documentation Improvement (Signed)
Possible Clinical Conditions?  Brain Herniation Other Condition Cannot Clinically Determine   Supporting Information:(As per notes) "There is associated 2 mm right-to-left midline shift now present at the level of the hemorrhage."  CT SCAN Head "Repeat CT Shows Increased right thalamic hemorrhage to 2.9 x 3.0 cm with increased edema, IVH slightly increased, vent size slightly increased. New 2 mm shift"   Thank You, Nevin BloodgoodJoan B Khamila Bassinger, RN, BSN, CCDS,Clinical Documentation Specialist:  [phone]  [phone]=Cell [place]- Health Information Management

## 2014-07-20 NOTE — Progress Notes (Signed)
Call from Tammy, RN. Daughters have met. They now desire comfort care for Lindsey [name]. Will write orders, transfer to floor and have SW see for disposition.  Dr. Sethi aware.  SETHI,PRAMOD  [facility] See Amion for Pager information 07/20/2014 2:47 PM  

## 2014-07-21 MED ORDER — FENTANYL CITRATE (PF) 100 MCG/2ML IJ SOLN
37.5000 ug | INTRAMUSCULAR | Status: DC | PRN
  Administered 2014-07-21 (×4): 37.5 ug via INTRAVENOUS
  Filled 2014-07-21 (×3): qty 2

## 2014-07-21 MED ORDER — LORAZEPAM 2 MG/ML IJ SOLN: 0.5000 mg | INTRAMUSCULAR | Status: DC | PRN

## 2014-07-21 MED ORDER — GLYCOPYRROLATE 0.2 MG/ML IJ SOLN
0.4000 mg | INTRAMUSCULAR | Status: DC | PRN
Start: 2014-07-21 — End: 2014-07-22
  Filled 2014-07-21: qty 2

## 2014-07-21 MED ORDER — ACETAMINOPHEN 650 MG RE SUPP: 650.0000 mg | Freq: Three times a day (TID) | RECTAL | Status: DC

## 2014-07-21 MED ORDER — SODIUM CHLORIDE 0.9 % IV SOLN
1.0000 mg/h | INTRAVENOUS | Status: DC
  Filled 2014-07-21: qty 10

## 2014-07-21 MED ORDER — FENTANYL CITRATE (PF) 100 MCG/2ML IJ SOLN
25.0000 ug | INTRAMUSCULAR | Status: DC | PRN
  Administered 2014-07-21 (×2): 50 ug via INTRAVENOUS
  Filled 2014-07-21 (×2): qty 2

## 2014-07-21 MED ORDER — FENTANYL CITRATE (PF) 100 MCG/2ML IJ SOLN
INTRAMUSCULAR | Status: AC
  Filled 2014-07-21: qty 2

## 2014-07-21 MED ORDER — SODIUM CHLORIDE 0.9 % IV SOLN
20.0000 ug/h | INTRAVENOUS | Status: DC
  Administered 2014-07-21: 20 ug/h via INTRAVENOUS
  Filled 2014-07-21: qty 50

## 2014-07-21 MED ORDER — FENTANYL BOLUS VIA INFUSION
25.0000 ug | INTRAVENOUS | Status: DC | PRN
  Administered 2014-07-21: 25 ug via INTRAVENOUS
  Administered 2014-07-21 – 2014-07-22 (×10): 50 ug via INTRAVENOUS
  Filled 2014-07-21: qty 50

## 2014-07-21 NOTE — Progress Notes (Signed)
STROKE TEAM PROGRESS NOTE   HISTORY Lindsey [name] is an 81 y.o. female hx of HTN, A fib on coumadin presenting with acute onset of Left sided weakness involving arm and leg. LKW 1715 07/18/2014. Per EMS, she was with family when she developed acute left sided flaccid weakness. Symptoms minimally improved upon EMS arrival to ED but with continued weakness. No facial weakness. No speech deficits. No visual deficits. No prior stroke or TIA. Recent INR was 2.6 on 6/20. Patient was not administered TPA secondary to CT showing ICH.  She was admitted to the neuro ICU for further evaluation and treatment.   SUBJECTIVE (INTERVAL HISTORY) Multiple family members present. The patient and her family concerned that her pain is not being adequately controlled. Dr. Pearlean Brownie ordered a morphine drip. The patient is awaiting placement at [facility]. The social worker is awaiting bed availability.  OBJECTIVE Temp:  [98.1 F (36.7 C)-98.9 F (37.2 C)] 98.4 F (36.9 C) (06/30 1414) Pulse Rate:  [62-96] 62 (06/30 1414) Cardiac Rhythm:  [-]  Resp:  [16-18] 18 (06/30 1414) BP: (136-144)/(59-66) 136/66 mmHg (06/30 1414) SpO2:  [91 %-93 %] 93 % (06/30 1414)  No results for input(s): GLUCAP in the last 168 hours.  Recent Labs Lab 07/18/14 1839 07/18/14 1846  NA 132* 134*  K 4.1 4.1  CL 97* 97*  CO2 25  --   GLUCOSE 170* 169*  BUN 14 17  CREATININE 1.26* 1.30*  CALCIUM 8.7*  --     Recent Labs Lab 07/18/14 1839  AST 26  ALT 15  ALKPHOS 74  BILITOT 0.8  PROT 7.1  ALBUMIN 3.8    Recent Labs Lab 07/18/14 1839 07/18/14 1846  WBC 6.1  --   NEUTROABS 3.4  --   HGB 10.8* 11.9*  HCT 32.8* 35.0*  MCV 97.0  --   PLT 253  --    No results for input(s): CKTOTAL, CKMB, CKMBINDEX, TROPONINI in the last 168 hours.  Recent Labs  07/18/14 1839 07/18/14 2111 07/19/14 0305 07/19/14 0930  LABPROT 23.3* 14.8 14.8 14.6  INR 2.09* 1.14 1.14 1.12    Recent Labs  07/18/14 2032  COLORURINE  YELLOW  LABSPEC 1.007  PHURINE 7.5  GLUCOSEU NEGATIVE  HGBUR NEGATIVE  BILIRUBINUR NEGATIVE  KETONESUR NEGATIVE  PROTEINUR NEGATIVE  UROBILINOGEN 0.2  NITRITE NEGATIVE  LEUKOCYTESUR NEGATIVE       Component Value Date/Time   CHOL 193 11/15/2011 0800   TRIG 61 11/15/2011 0800   HDL 93 11/15/2011 0800   CHOLHDL 2.1 11/15/2011 0800   VLDL 12 11/15/2011 0800   LDLCALC 88 11/15/2011 0800   Lab Results  Component Value Date   HGBA1C 6.5 04/12/2013      Component Value Date/Time   LABOPIA POSITIVE* 07/18/2014 2032   COCAINSCRNUR NONE DETECTED 07/18/2014 2032   LABBENZ POSITIVE* 07/18/2014 2032   AMPHETMU NONE DETECTED 07/18/2014 2032   THCU NONE DETECTED 07/18/2014 2032   LABBARB NONE DETECTED 07/18/2014 2032     Recent Labs Lab 07/18/14 1839  ETH <5    No results found.   PHYSICAL EXAM Elderly caucasian lady not in distress. . Afebrile. Head is nontraumatic. Neck is supple without bruit.    Cardiac exam no murmur or gallop. Lungs are clear to auscultation. Distal pulses are well felt. Neurological Exam : sleepy but can be aroused. Opens eyes briefly but cannot sustain it. Right gaze preference and will not look to the left. Does not blink to threat on either side.  Fundi were not visualized. Pupils ports 6 mm irregular but reactive. Corneal reflexes are present. Left lower facial weakness. Tongue midline. Patient does not follow commands consistently. Her speech is dysarthric and at times difficult to understand. Not speaking fluently. Dense left hemiplegia with diminished tone and hypotonia. Purposeful antigravity movements on the right side. Does not withdraw to pain on the left but does so on the right. Left plantar upgoing right downgoing.   ASSESSMENT/PLAN Lindsey [name] is a 81 y.o. female with history of HTN, A fib on coumadin presenting with left sided weakness. CT shows hemorrhage.  She did not receive IV t-PA due to ICH.    Stroke:  right thalamic  intracerebral hemorrhage with intraventricular extension, cerebral edema and midline shift. Hemorrhage secondary to warfarin related coagulapathy  Resultant  Lethargic state, L hemiparesis, inattention  CT R thalamic ICH w/ IVH  Repeat CT  Shows Increased right thalamic hemorrhage to  2.9 x 3.0 cm with increased edema, IVH slightly increased, vent size slightly increased. New 2 mm shift   HgbA1c not indicated  SCDs for VTE prophylaxis Diet NPO time specified  warfarin prior to admission, with INR 2.09 on arrival. Reversed with Kcentra. INR now 1.14.  Ongoing aggressive stroke risk factor management  Patient had opted for DNR during her last admission. Daughter agreeable to DNR for now. Ongoing medical support to see if she worsens or improves over the next day or so. Daughter to speak with her sister related to goals of care.  Therapy recommendations: No therapy secondary to full comfort care status.  Disposition:  [facility]  Atrial Fibrillation  Home anticoagulation:  Warfarin  INR 2.09 on admission   No longer an anticoagulation candidate   Hypertension  No hx of hypertension  Felt to be result of hemorrhage  SBP goal < 160  Received labetolol 4 times during the night  Stable right now < 160  Other Stroke Risk Factors  Advanced age  Family hx stroke (maternal grandmother)  Other Active Problems  Hyponatremia  CKD, Cr 1.3  Hyperglycemia  Elevated temperature, receiving tylenol  Hospital day # 3  RINEHULS, DAVID Wallace CullensL  [facility] See Amion for Pager information   07/21/2014 4:32 PM  I have personally examined this patient, reviewed notes, independently viewed imaging studies, participated in medical decision making and plan of care. I have made any additions or clarifications directly to the above note.  Plan to change fentanyl to IV morphine drip as patient is requiring frequent pain medication   Delia HeadyPramod Sethi,  MD Medical Director [facility] Pager: [phone] 07/21/2014 4:32 PM   To contact Stroke Continuity provider, please refer to [email or web address]. After hours, contact General Neurology

## 2014-07-21 NOTE — Clinical Social Work Note (Signed)
CSW consulted with Palliative NP regarding patient's disposition. Patient's family agreeable to [facility]idential [facility]ospice placement.  CSW has made appropriate referrals.  Awaiting bed availability.  CSW to continue to follow and update once bed available.  Marcelline Deistmily Zaedyn Covin, [place] - [phone] Clinical Social Work Department Orthopedics [phone]([id number]) and Surgical [phone]([id number])

## 2014-07-21 NOTE — Progress Notes (Signed)
Daily Progress Note   Patient Name: Lindsey [name]       Date: 07/21/2014 DOB: [birth year]  Age: 81 y.o. MRN#: [id number] Attending Physician: Micki Riley, MD Primary Care Physician: Marga Melnick, MD Admit Date: 07/18/2014  Reason for Consultation/Follow-up: Establishing goals of care  Subjective:     Ms. Hizer is more alert today but also having generalized pain. She had increasing pain overnight needing increasing doses of fentanyl which is still not relieving her pain. I discussed with family at bedside and offered fentanyl infusion to better manage her pain but they were not wanting this at that time. I discussed another option of fentanyl patch for long acting pain management which they agreed too.  I received a call from RN saying that she spoke with Dr. Pearlean Brownie who ordered morphine gtt. I explained that family did not want this when I spoke with them but if they are agreeable now I believe that would be appropriate. I did change her to fentanyl infusion as she has morphine allergy listed beginning at low dose with frequent boluses. I will continue to follow.    Length of Stay: 3 days  Current Medications: Scheduled Meds:     Continuous Infusions:    PRN Meds: [DISCONTINUED] acetaminophen **OR** acetaminophen, fentaNYL (SUBLIMAZE) injection, glycopyrrolate, LORazepam  Palliative Performance Scale: 20%     Vital Signs: BP 144/66 mmHg  Pulse 75  Temp(Src) 98.1 F (36.7 C) (Oral)  Resp 16  Ht  (1.651 m)  Wt 80.4 kg (177 lb 4 oz)  BMI 29.50 kg/m2  SpO2 93% SpO2: SpO2: 93 % O2 Device: O2 Device: Not Delivered O2 Flow Rate: O2 Flow Rate (L/min): 1 L/min  Intake/output summary:  Intake/Output Summary (Last 24 hours) at 07/21/14 1149 Last data filed at 07/21/14 0900  Gross per 24 hour  Intake      0 ml  Output   5750 ml  Net  -5750 ml    Last BM Date: 07/18/14 Baseline Weight: Weight: 77.111 kg (170 lb) Most recent weight: Weight: 80.4 kg (177 lb  4 oz)  Physical Exam: General: NAD, lying in bed HEENT: Temporal muscle wasting CVS: RRR Resp: No labored breathing, rhonchi throughout, decreased bases Abd: Soft, NT, ND Extrem: Warm, dry Neuro: Awake, alert, answers questions appropriately    Additional Data Reviewed: Recent Labs     07/18/14  1839  07/18/14  1846  WBC  6.1   --   HGB  10.8*  11.9*  PLT  253   --   NA  132*  134*  BUN  14  17  CREATININE  1.26*  1.30*     Problem List:  Patient Active Problem List   Diagnosis Date Noted  . Palliative care encounter 07/20/2014  . Stroke   . ICH (intracerebral hemorrhage) 07/18/2014  . Bleeding hemorrhoid 05/06/2014  . GI bleed 05/01/2014  . Chronic anticoagulation 05/01/2014  . DM (diabetes mellitus) 04/18/2013  . Peripheral neuropathy 04/18/2013  . Encounter for therapeutic drug monitoring 03/01/2013  . Chest pain, musculoskeletal 11/14/2011  . Fibrocystic change of breast,right 11/20/2010  . Long term current use of anticoagulant 03/05/2010  . GERD 08/11/2009  . Unspecified anemia 02/27/2009  . Essential hypertension 02/27/2009  . DIVERTICULOSIS, COLON 02/27/2009  . VITAMIN D DEFICIENCY 08/25/2008  . Osteoporosis 08/25/2008  . Venous insufficiency 05/09/2008  . Chronic atrial fibrillation 03/09/2008  . ICD OR LEAD INFECTION 03/09/2008     Palliative Care Assessment & Plan  Code Status:  DNR  Goals of Care:  Comfort  3. Symptom Management:  Pain/dyspnea: fentanyl 20 mcg/hour infusion (pain not controlled with fentanyl 400 mcg over past 24 hrs = 16 mcg/hr). Frequent boluses available 25-50 mcg every 30 min prn. Also added scheduled Tylenol supp as family says this helped with her arthritic pain previously.   Agitation: Lorazepam 0.5 mg every 4 hours prn.   4. Prognosis: < 3-4 weeks  5. Discharge Planning: Hospice facility   Thank you for allowing the Palliative Medicine Team to assist in the care of this patient.   Time In: 1200 Time  Out: 1230 Total Time 30min Prolonged Time Billed  no     Greater than 50%  of this time was spent counseling and coordinating care related to the above assessment and plan.   Yong ChannelAlicia Sarinity Dicicco, NP Palliative Medicine Team Pager # [phone] (M-F 8a-5p) Team Phone # [phone] (Nights/Weekends)  07/21/2014, 11:49 AM

## 2014-07-21 NOTE — Progress Notes (Signed)
Late entry- 0145-patient moaning;states she is miserable.Patient was medicated with 25 mcg Fentanyl at 0100.  Notified Dr. Thad Rangereynolds. Pt given 37.765mcg Fentanyl per order. Will continue to monitor.

## 2014-07-21 NOTE — Progress Notes (Signed)
Had primed ms gtt and ready to hang when new order for fent gtt noted, therefore ms gtt wasted in sink, witnessed by Everlene Otheraylor Spencer RN.

## 2014-07-21 NOTE — Care Management (Signed)
Important Message  Patient Details  Name: Lindsey [name] MRN: [id number] Date of Birth: 1923/11/18   Medicare Important Message Given:  Yes-third notification given    Orson AloeMegan P Aaralynn Shepheard 07/21/2014, 10:45 AM

## 2014-07-22 DIAGNOSIS — Z515 Encounter for palliative care: Secondary | ICD-10-CM

## 2014-07-22 NOTE — Discharge Planning (Signed)
Cr. Sethi notified at [phone] that a [facility] available, need DCsummary and Gold form signed.

## 2014-07-22 NOTE — Discharge Summary (Signed)
Stroke Discharge Summary  Patient ID: Lindsey [name]   MRN: [id number]      DOB: [birth year]  Date of Admission: 07/18/2014 Date of Discharge: 07/22/2014  Attending Physician:  Micki Riley, MD, Stroke MD  Consulting Physician(s):   Treatment Team:  Palliative [facility] neurology  Patient's PCP:  Marga Melnick, MD  DISCHARGE DIAGNOSIS: Right thalamic hemorrhage with vasogenic edema Active Problems:   ICH (intracerebral hemorrhage)   Stroke   Palliative care encounter  BMI: Body mass index is 29.5 kg/(m^2).  Past Medical History  Diagnosis Date  . Atrial fibrillation     a. with bradycardia - remains on bb;  b. chronic coumadin.  . Diverticulitis 2003-2008  . GERD (gastroesophageal reflux disease)   . Osteoporosis     T score -3.9 @ forearm  . Vitamin D deficiency     Vitamin D 27 in 2010  . Anemia     PMH  of  . Duodenal ulcer 1976    surgery   . 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 Despina Hick  . Hx of colonoscopy 02/2006    Internal hemorrhoids  . Nipple discharge   . Arthritis   . Hemorrhoids   . Venous stasis     a. bilat LE w/ variscosities.  [name] Hearing loss   . Syncope     a. 2003 - felt to be vasovagal;  b. 06/2001 Echo: EF 55-60%, no rwma;  . Restless leg syndrome    Past Surgical History  Procedure Laterality Date  . Appendectomy    . Cholecystectomy    . Tonsillectomy and adenoidectomy    . Total hip arthroplasty  2010    transfusions  . Breast biopsy  11/05/10    breast lump removed- rt br  . Laparoscopic gastrotomy w/ repair of ulcer    . Abdominal hysterectomy        Medication List    STOP taking these medications        ALIGN 4 MG Caps     CALTRATE 600+D 600-400 MG-UNIT per tablet  Generic drug:  Calcium Carbonate-Vitamin D     docusate sodium 100 MG capsule  Commonly known as:  COLACE     ferrous sulfate 325 (65 FE) MG tablet     gabapentin 100 MG capsule  Commonly known as:  NEURONTIN     multivitamin tablet      nitroGLYCERIN 0.4 MG SL tablet  Commonly known as:  NITROSTAT     pantoprazole 40 MG tablet  Commonly known as:  PROTONIX     polyethylene glycol packet  Commonly known as:  MIRALAX / GLYCOLAX     pyridOXINE 100 MG tablet  Commonly known as:  VITAMIN B-6     spironolactone 25 MG tablet  Commonly known as:  ALDACTONE     Vitamin D3 2000 UNITS Tabs     warfarin 2.5 MG tablet  Commonly known as:  COUMADIN      TAKE these medications        diazepam 5 MG tablet  Commonly known as:  VALIUM  Take 5 mg by mouth every 6 (six) hours as needed. For anxiety     HYDROcodone-acetaminophen 5-325 MG per tablet  Commonly known as:  NORCO/VICODIN  Take 1 tablet by mouth every 5 (five) hours as needed (pain).        LABORATORY STUDIES CBC    Component Value Date/Time   WBC 6.1 07/18/2014 1839   RBC  3.38* 07/18/2014 1839   HGB 11.9* 07/18/2014 1846   HCT 35.0* 07/18/2014 1846   PLT 253 07/18/2014 1839   MCV 97.0 07/18/2014 1839   MCH 32.0 07/18/2014 1839   MCHC 32.9 07/18/2014 1839   RDW 13.1 07/18/2014 1839   LYMPHSABS 2.0 07/18/2014 1839   MONOABS 0.5 07/18/2014 1839   EOSABS 0.2 07/18/2014 1839   BASOSABS 0.0 07/18/2014 1839   CMP    Component Value Date/Time   NA 134* 07/18/2014 1846   K 4.1 07/18/2014 1846   CL 97* 07/18/2014 1846   CO2 25 07/18/2014 1839   GLUCOSE 169* 07/18/2014 1846   BUN 17 07/18/2014 1846   CREATININE 1.30* 07/18/2014 1846   CALCIUM 8.7* 07/18/2014 1839   PROT 7.1 07/18/2014 1839   ALBUMIN 3.8 07/18/2014 1839   AST 26 07/18/2014 1839   ALT 15 07/18/2014 1839   ALKPHOS 74 07/18/2014 1839   BILITOT 0.8 07/18/2014 1839   GFRNONAA 37* 07/18/2014 1839   GFRAA 43* 07/18/2014 1839   COAGS Lab Results  Component Value Date   INR 1.12 07/19/2014   INR 1.14 07/19/2014   INR 1.14 07/18/2014   PROTIME 18.8 06/27/2008   Lipid Panel    Component Value Date/Time   CHOL 193 11/15/2011 0800   TRIG 61 11/15/2011 0800   HDL 93 11/15/2011  0800   CHOLHDL 2.1 11/15/2011 0800   VLDL 12 11/15/2011 0800   LDLCALC 88 11/15/2011 0800   HgbA1C  Lab Results  Component Value Date   HGBA1C 6.5 04/12/2013   Cardiac Panel (last 3 results) No results for input(s): CKTOTAL, CKMB, TROPONINI, RELINDX in the last 72 hours. Urinalysis    Component Value Date/Time   COLORURINE YELLOW 07/18/2014 2032   APPEARANCEUR CLEAR 07/18/2014 2032   LABSPEC 1.007 07/18/2014 2032   PHURINE 7.5 07/18/2014 2032   GLUCOSEU NEGATIVE 07/18/2014 2032   HGBUR NEGATIVE 07/18/2014 2032   BILIRUBINUR NEGATIVE 07/18/2014 2032   KETONESUR NEGATIVE 07/18/2014 2032   PROTEINUR NEGATIVE 07/18/2014 2032   UROBILINOGEN 0.2 07/18/2014 2032   NITRITE NEGATIVE 07/18/2014 2032   LEUKOCYTESUR NEGATIVE 07/18/2014 2032   Urine Drug Screen     Component Value Date/Time   LABOPIA POSITIVE* 07/18/2014 2032   COCAINSCRNUR NONE DETECTED 07/18/2014 2032   LABBENZ POSITIVE* 07/18/2014 2032   AMPHETMU NONE DETECTED 07/18/2014 2032   THCU NONE DETECTED 07/18/2014 2032   LABBARB NONE DETECTED 07/18/2014 2032    Alcohol Level    Component Value Date/Time   [facility] <5 07/18/2014 1839     SIGNIFICANT DIAGNOSTIC STUDIES  CT Head W/o Contrast 07/19/2014 IMPRESSION: 1. Increased size of right thalamic hemorrhage now measuring 2.9 x 3.0 cm with increased localized vasogenic edema. 2. Intraventricular extension with overall slightly increased amount of intraventricular blood. Ventricular size is stable to slightly increased relative to 07/18/14. 3. New 2 mm of localized right-to-left midline shift at the level of the hemorrhage.    CT Head W/o Contrast 07/18/2014 IMPRESSION: Right thalamic intraparenchymal hemorrhage is noted with intraventricular extension and all 4 ventricles.        HISTORY OF PRESENT ILLNESS  Lindsey [name] is an 81 y.o. female hx of HTN, A fib on coumadin presenting with acute onset of Left sided weakness involving arm and leg. LKW  1715 07/18/2014. Per EMS, she was with family when she developed acute left sided flaccid weakness. Symptoms minimally improved upon EMS arrival to ED but with continued weakness. No facial weakness. No speech deficits. No visual  deficits. No prior stroke or TIA. Recent INR was 2.6 on 6/20. Patient was not administered TPA secondary to CT showing ICH. She was admitted to the neuro ICU for further evaluation and treatment.  HOSPITAL COURSE  Ms. Lindsey [name] is a 81 y.o. female with history of HTN, A fib on coumadin presenting with left sided weakness. CT shows hemorrhage. She did not receive IV t-PA due to ICH.   Following in-depth discussions with Dr. Pearlean Brownie and a palliative care consult the patient and family elected for comfort care only. They requested [facility] and arrangements were made for placement. The patient is to be discharged 07/22/2014 to [facility].   Stroke: right thalamic intracerebral hemorrhage with intraventricular extension, cerebral edema and midline shift. Hemorrhage secondary to warfarin related coagulapathy  Resultant Lethargic state, L hemiparesis, inattention  CT R thalamic ICH w/ IVH  Repeat CT Shows Increased right thalamic hemorrhage to 2.9 x 3.0 cm with increased edema, IVH slightly increased, vent size slightly increased. New 2 mm shift   HgbA1c not indicated  SCDs for VTE prophylaxis  Diet NPO time specified  warfarin prior to admission, with INR 2.09 on arrival. Reversed with [facility]. INR now 1.14.  Ongoing aggressive stroke risk factor management  Patient had opted for DNR during her last admission. Daughter agreeable to DNR for now. Ongoing medical support to see if she worsens or improves over the next day or so. Daughter to speak with her sister related to goals of care.  Therapy recommendations: No therapy secondary to full comfort care status.  Disposition: Awaiting bed at [facility]  Atrial Fibrillation  Home  anticoagulation: Warfarin  INR 2.09 on admission  No longer an anticoagulation candidate  Hypertension  No hx of hypertension  Felt to be result of hemorrhage  SBP goal < 160  Received labetolol 4 times during the night  Stable right now < 160  Other Stroke Risk Factors  Advanced age  Family hx stroke (maternal grandmother)  Other Active Problems  Hyponatremia  CKD, Cr 1.3  Hyperglycemia  Elevated temperature, receiving tylenol  DISCHARGE EXAM Blood pressure 148/73, pulse 66, temperature 98.3 F (36.8 C), temperature source Axillary, resp. rate 17, height  (1.651 m), weight 80.4 kg (177 lb 4 oz), SpO2 96 %. Elderly caucasian lady not in distress. . Afebrile. Head is nontraumatic. Neck is supple without bruit. Cardiac exam no murmur or gallop. Lungs are clear to auscultation. Distal pulses are well felt. Neurological Exam : sleepy but can be aroused. Opens eyes briefly but cannot sustain it. Right gaze preference and will not look to the left. Does not blink to threat on either side. Fundi were not visualized. Pupils ports 6 mm irregular but reactive. Corneal reflexes are present. Left lower facial weakness. Tongue midline. Patient does not follow commands consistently. Her speech is dysarthric and at times difficult to understand. Not speaking fluently. Dense left hemiplegia with diminished tone and hypotonia. Purposeful antigravity movements on the right side. Does not withdraw to pain on the left but does so on the right. Left plantar upgoing right downgoing.  Discharge Diet   Diet NPO time specified liquids  DISCHARGE PLAN  Disposition:  Discharge to [facility] for comfort care.  no antithrombotic for secondary stroke prevention.   32 minutes were spent preparing discharge.  Delton See PA-C [facility] Pager [phone] 07/22/2014, 11:43 AM I have personally examined this patient, reviewed notes,  independently viewed imaging studies, participated in medical  decision making and plan of care. I have made any additions or clarifications directly to the above note. Agree with note above.  Discussed with multiple family members at the bedside and answered questions.  Delia HeadyPramod Charlestine Rookstool, MD Medical Director [facility] Pager: [phone] 07/22/2014 1:44 PM

## 2014-07-22 NOTE — Discharge Planning (Signed)
[facility]. Patient's daughter updated regarding discharge.  [facility]  [facility] report number: [phone] Transportation: EMS ([address])  Marcelline Deistmily Zyron Deeley, [place] - [phone] Clinical Social Work Department Orthopedics [phone]([id number]) and Surgical [phone]([id number])

## 2014-07-22 NOTE — Consult Note (Signed)
[place] Liaison:  Received request from [place] for family interest in [facility]. Chart reviewed and met with family at bedside. Daughter Ivin Booty completed paper work for transfer today. Dr. Orpah Melter to assume care per family preference.   Please fax discharge summary to [phone].  RN please call report to [phone].  Please arrange transport for patient to arrive as soon as possible.  Thank you.  Erling Conte, [place]

## 2014-07-22 NOTE — Discharge Planning (Signed)
[facility]. EMS here for transport at 1140.

## 2014-08-22 DEATH — deceased

## 2015-11-09 IMAGING — CT CT HEAD W/O CM
3 of 4 series · 16 of 30 positions shown, 18 images · non-contrast
Comparison: Prior CT from 07/18/2014

CLINICAL DATA: Initial evaluation for confusion, known intracranial
hemorrhage.

EXAM:
CT HEAD WITHOUT CONTRAST
TECHNIQUE: Contiguous axial images were obtained from the base of the skull
through the vertex without intravenous contrast.

[Series 201: head w/o, idose (1) · axial · non-contrast · 0.49mm/px · z∈[+70,+165]mm · 5 of 29 slices shown (1 of 2)]
[im 5/29  brain]
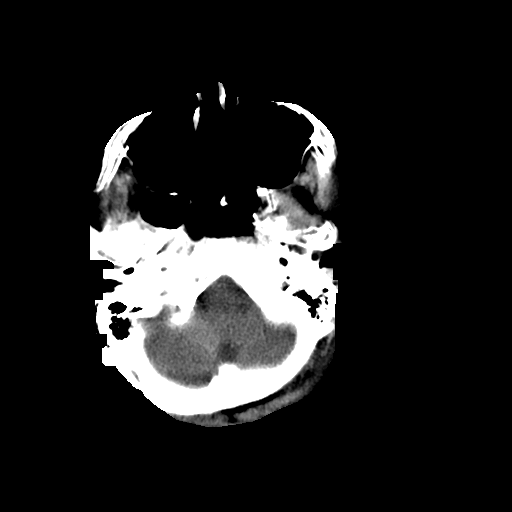
[im 10/29  brain]
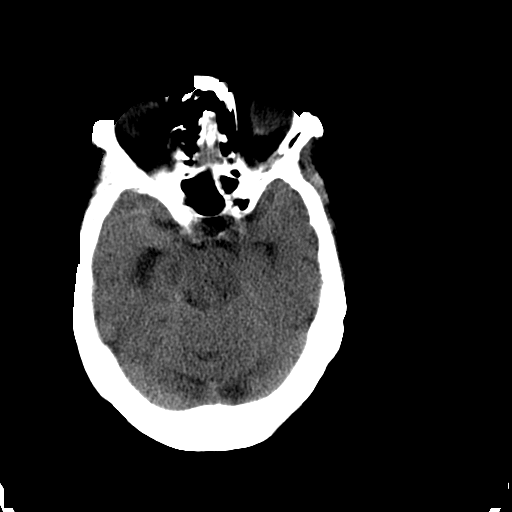
[im 15/29  brain]
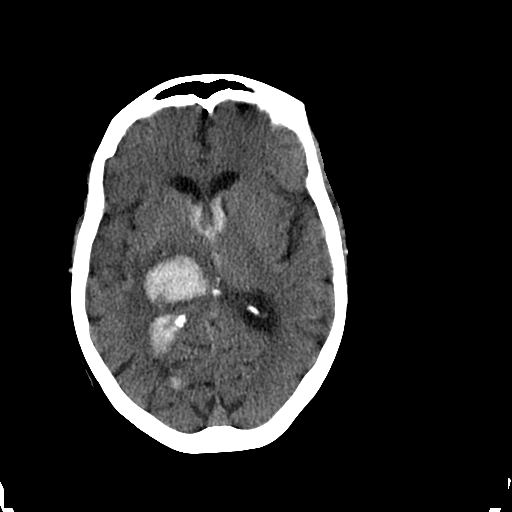
[im 19/29  brain]
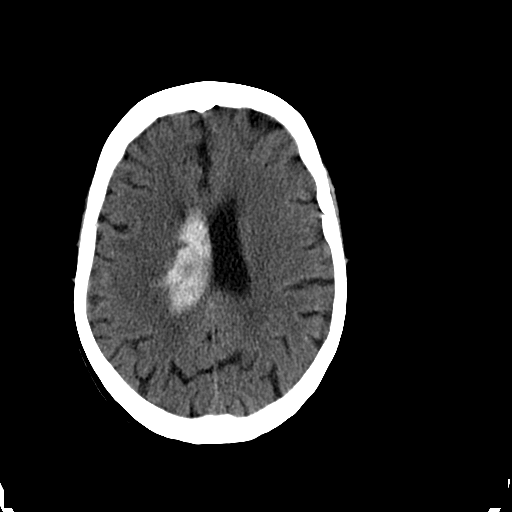
[im 24/29  brain]
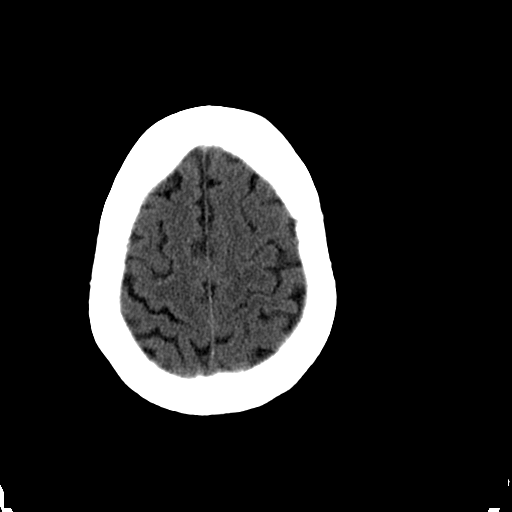

[Series 202: head w/o bone, idose (1) · axial · non-contrast · 0.49mm/px · z∈[+70,+165]mm · 5 of 29 slices shown]
[im 5/29  bone]
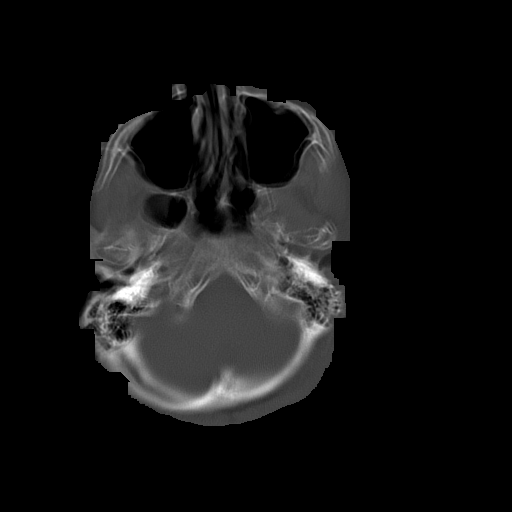
[im 10/29  bone]
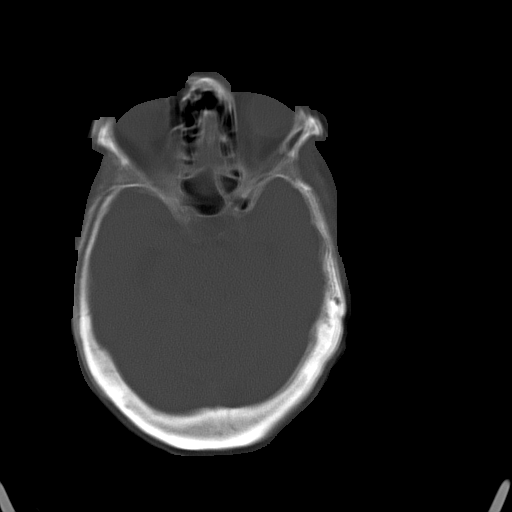
[im 15/29  bone]
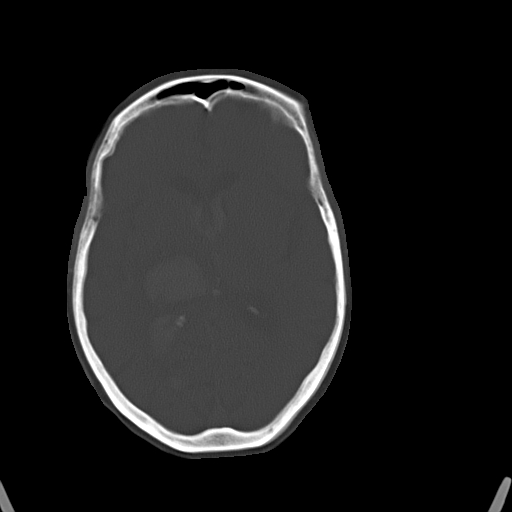
[im 19/29  bone]
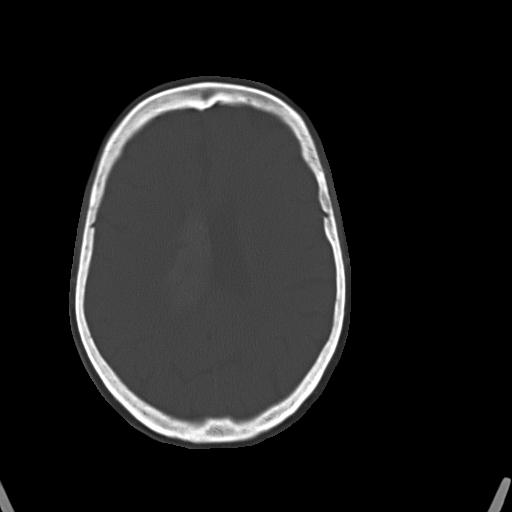
[im 24/29  bone]
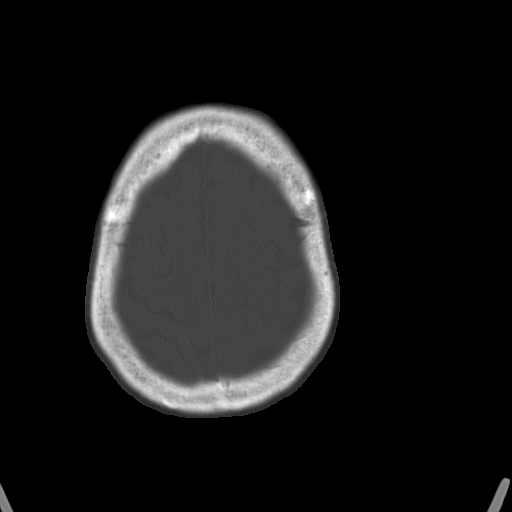

[Series 301: head w/o, idose (1) · axial · non-contrast · 0.49mm/px · z∈[+70,+170]mm · 6 of 29 slices shown, 8 images (2 of 2)]
[im 5/29  brain]
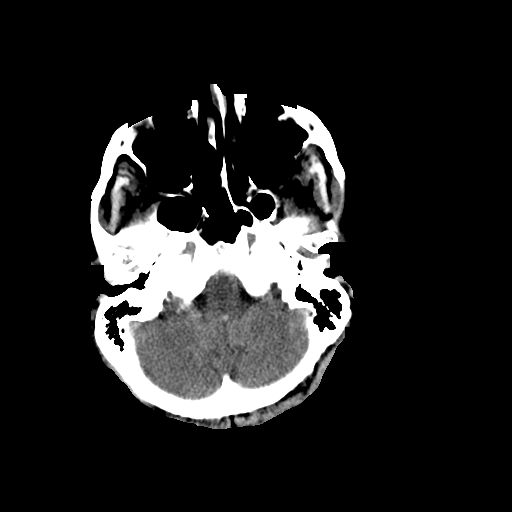
[im 5/29  bone]
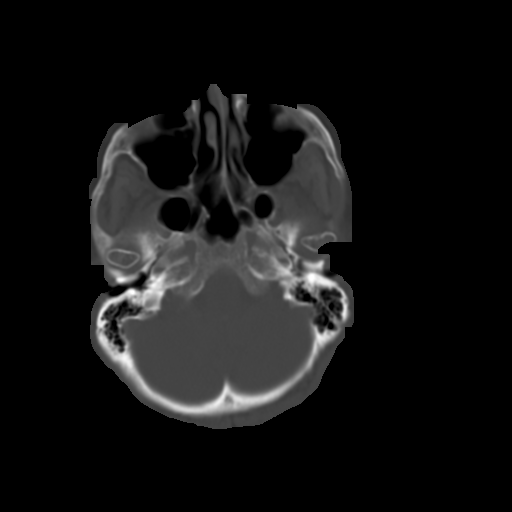
[im 9/29  brain]
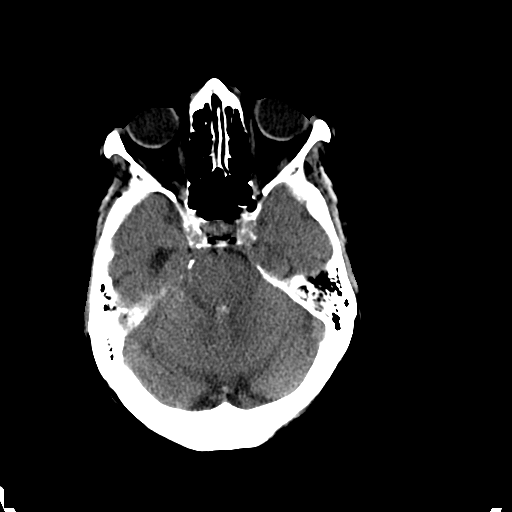
[im 13/29  brain]
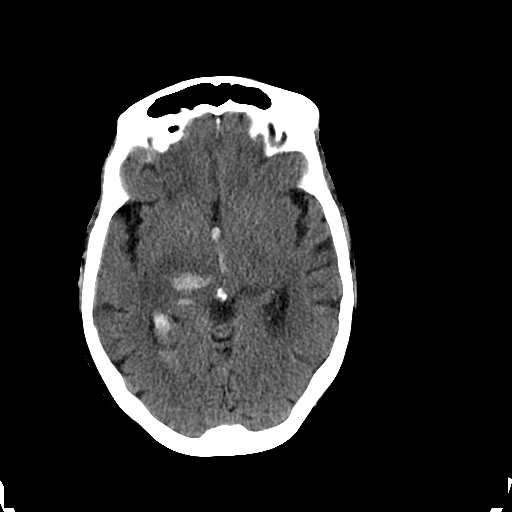
[im 17/29  brain]
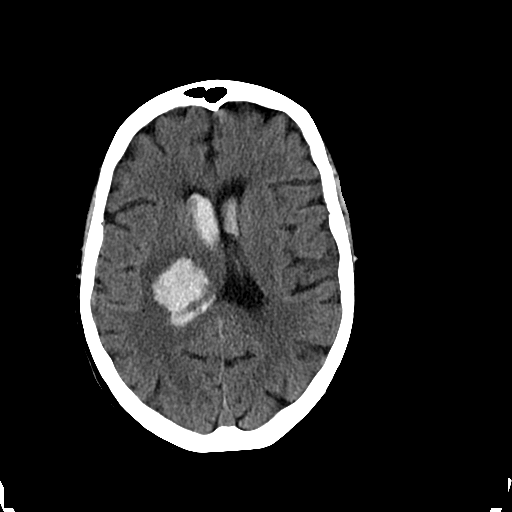
[im 21/29  brain]
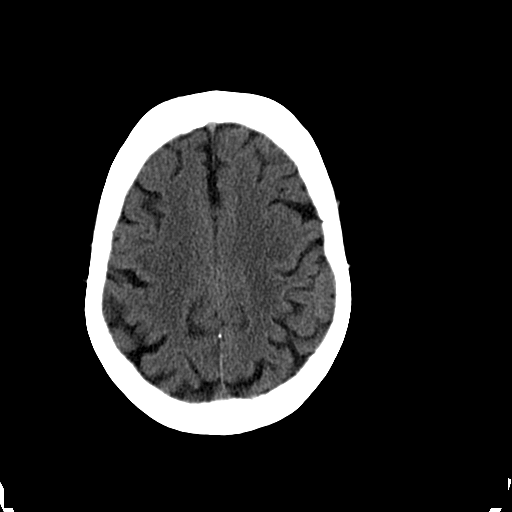
[im 21/29  bone]
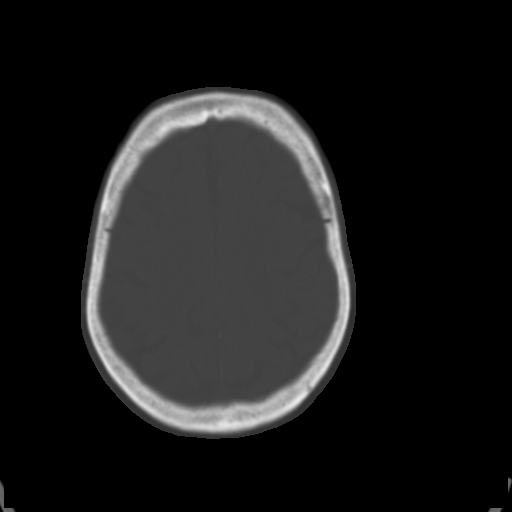
[im 25/29  brain]
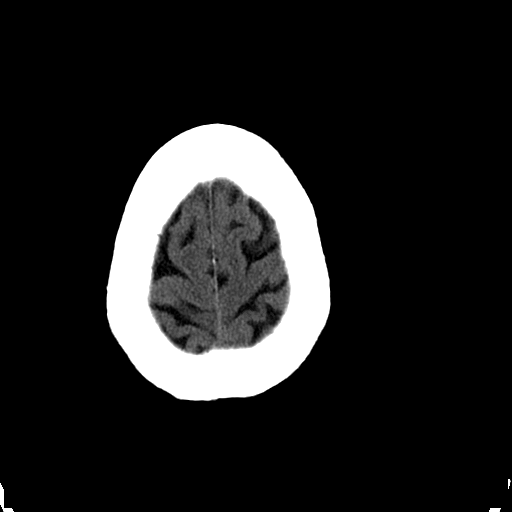

[16 of 30 positions shown; findings below may reference images not displayed]

FINDINGS: Previously identified hemorrhage centered at the right thalamus is
increased in size now measuring 2.9 x 3.0 cm. Associated vasogenic
edema surrounding the hemorrhage is also slightly increased.
Intraventricular extension with blood within the lateral, third, and
fourth ventricles again seen, also slightly increased in amount.
There is associated 2 mm right-to-left midline shift now present at
the level of the hemorrhage. Overall ventricular size is stable the
slightly increased from prior without frank hydrocephalus. Basilar
cisterns remain patent.

No acute large vessel territory infarct. No extra-axial fluid
collection. No mass lesion.

No acute abnormality about the calvarium or scalp soft tissues.
Orbits within normal limits.

Paranasal sinuses and mastoid air cells remain clear.
IMPRESSION: 1. Increased size of right thalamic hemorrhage now measuring 2.9 x
3.0 cm with increased localized vasogenic edema.
2. Intraventricular extension with overall slightly increased amount
of intraventricular blood. Ventricular size is stable to slightly
increased relative to 07/18/14.
3. New 2 mm of localized right-to-left midline shift at the level of
the hemorrhage.
